# Patient Record
Sex: Female | Born: 1970 | Race: Black or African American | Hispanic: No | Marital: Single | Smoking: Former smoker
Health system: Southern US, Community
[De-identification: ages and names within clinical notes are randomized; demographics above are authoritative.]

## PROBLEM LIST (undated history)

## (undated) DIAGNOSIS — I10 Essential (primary) hypertension: Secondary | ICD-10-CM

## (undated) DIAGNOSIS — Z9889 Other specified postprocedural states: Secondary | ICD-10-CM

## (undated) DIAGNOSIS — I4729 Other ventricular tachycardia: Secondary | ICD-10-CM

## (undated) DIAGNOSIS — I5022 Chronic systolic (congestive) heart failure: Secondary | ICD-10-CM

## (undated) DIAGNOSIS — S83206A Unspecified tear of unspecified meniscus, current injury, right knee, initial encounter: Secondary | ICD-10-CM

## (undated) DIAGNOSIS — I472 Ventricular tachycardia: Secondary | ICD-10-CM

## (undated) DIAGNOSIS — F329 Major depressive disorder, single episode, unspecified: Secondary | ICD-10-CM

## (undated) DIAGNOSIS — R002 Palpitations: Secondary | ICD-10-CM

## (undated) DIAGNOSIS — S83511A Sprain of anterior cruciate ligament of right knee, initial encounter: Secondary | ICD-10-CM

## (undated) DIAGNOSIS — B009 Herpesviral infection, unspecified: Secondary | ICD-10-CM

## (undated) DIAGNOSIS — F32A Depression, unspecified: Secondary | ICD-10-CM

## (undated) DIAGNOSIS — Z87898 Personal history of other specified conditions: Secondary | ICD-10-CM

## (undated) HISTORY — DX: Ventricular tachycardia: I47.2

## (undated) HISTORY — PX: ABLATION: SHX5711

## (undated) HISTORY — PX: SVT ABLATION: EP1225

## (undated) HISTORY — DX: Depression, unspecified: F32.A

## (undated) HISTORY — DX: Other specified postprocedural states: Z98.890

## (undated) HISTORY — DX: Other ventricular tachycardia: I47.29

## (undated) HISTORY — DX: Major depressive disorder, single episode, unspecified: F32.9

## (undated) HISTORY — DX: Essential (primary) hypertension: I10

## (undated) HISTORY — DX: Palpitations: R00.2

## (undated) HISTORY — PX: CARDIAC VALVE SURGERY: SHX40

## (undated) HISTORY — DX: Herpesviral infection, unspecified: B00.9

---
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
## 1998-04-21 ENCOUNTER — Inpatient Hospital Stay (HOSPITAL_COMMUNITY): Admission: AD | Admit: 1998-04-21 | Discharge: 1998-04-21 | Payer: Self-pay | Admitting: Obstetrics & Gynecology

## 1998-04-22 ENCOUNTER — Inpatient Hospital Stay (HOSPITAL_COMMUNITY): Admission: AD | Admit: 1998-04-22 | Discharge: 1998-04-22 | Payer: Self-pay | Admitting: Obstetrics & Gynecology

## 1998-04-26 ENCOUNTER — Inpatient Hospital Stay (HOSPITAL_COMMUNITY): Admission: AD | Admit: 1998-04-26 | Discharge: 1998-04-26 | Payer: Self-pay | Admitting: *Deleted

## 1998-05-02 ENCOUNTER — Inpatient Hospital Stay (HOSPITAL_COMMUNITY): Admission: AD | Admit: 1998-05-02 | Discharge: 1998-05-04 | Payer: Self-pay | Admitting: Obstetrics and Gynecology

## 1998-11-12 ENCOUNTER — Emergency Department (HOSPITAL_COMMUNITY): Admission: EM | Admit: 1998-11-12 | Discharge: 1998-11-12 | Payer: Self-pay | Admitting: Emergency Medicine

## 1998-11-12 ENCOUNTER — Encounter: Payer: Self-pay | Admitting: Emergency Medicine

## 2000-01-23 ENCOUNTER — Ambulatory Visit (HOSPITAL_COMMUNITY): Admission: RE | Admit: 2000-01-23 | Discharge: 2000-01-23 | Payer: Self-pay | Admitting: *Deleted

## 2000-01-23 ENCOUNTER — Encounter: Payer: Self-pay | Admitting: *Deleted

## 2006-08-27 ENCOUNTER — Ambulatory Visit (HOSPITAL_COMMUNITY): Admission: RE | Admit: 2006-08-27 | Discharge: 2006-08-27 | Payer: Self-pay | Admitting: Ophthalmology

## 2006-08-27 HISTORY — PX: RETINAL DETACHMENT REPAIR W/ SCLERAL BUCKLE LE: SHX2338

## 2007-02-27 ENCOUNTER — Other Ambulatory Visit: Admission: RE | Admit: 2007-02-27 | Discharge: 2007-02-27 | Payer: Self-pay | Admitting: Family Medicine

## 2010-12-28 NOTE — Op Note (Signed)
NAMEBRIAN, Sydney Jackson              ACCOUNT NO.:  [id number]   MEDICAL RECORD NO.:  [id number]          PATIENT TYPE:  AMB   LOCATION:  SDS                          FACILITY:  [facility]   PHYSICIAN:  Jillyn Hidden A. Rankin, M.D.   DATE OF BIRTH:  [birth year]   DATE OF PROCEDURE:  08/27/2006  DATE OF DISCHARGE:                               OPERATIVE REPORT   PREOPERATIVE DIAGNOSIS:  Rhegmatogenous detachment - left eye, with  retinal dialysis.   PROCEDURE:  1. Scleral buckle using 240, 70, and 287 solid silicone explants.  2. Retinal cryopexy, left eye.  3. External drainage of subretinal fluid, left eye.   SURGEON:  Alford Highland. Rankin, MD   ANESTHESIA:  General endotracheal anesthesia.   INDICATIONS FOR PROCEDURE:  The patient is a 40 year old woman who has  asymptomatic inferior retinal dialysis with accompanying rhegmatogenous  detachment approximately 2 o'clock hours anterior to the equator.  There  are clear signs of chronicity to this area, and thus, this patient had a  2nd opinion with a physician elsewhere, confirming the presence of  retinal detachment and the need for scleral buckle retinal detachment  repair intervention.  She understands the risks of anesthesia, including  the rare occurrence of death, loss of the eye including from the  condition, as well as surgical repair, hemorrhage, infection, scarring,  need for another surgery, no change in vision, loss of vision,  progression of disease despite intervention.  Appropriate signed consent  was obtained.  The patient was taken to the operating room.   In the operating room, appropriate monitoring was followed by mild  sedation.  General endotracheal anesthesia instituted without  difficulty.  The left periocular region was sterilely prepped and draped  in the usual sterile fashion.  Lid speculum applied.  Conjunctival  peritomy was advanced to 360 degrees.  Rectus muscles isolated on 2-0  silk ties.  Solid silicone explant was  selected to place in the  inferotemporal quadrant for this detachment extending from the 4 o'clock  position to the 6 o'clock position.  A retinal cryopexy was placed at  the edge of the retinal dialysis.  A small area noted in the  superotemporal quadrant was also treated as suspicious for retinal  lattice degeneration.   At this time, the silicone explant was then placed and temporarily tied  with fiber mersalyl and matched sutures in the inferotemporal quadrant.  The encircling band was placed and secured in the superonasal quadrant  with a Watzke sleeve.  At this time, appropriate tension was applied.  External drainage was carried out with direct visualization with the  ophthalmoscope, the subretinal fluid with a 26-gauge needle __________.  This was carried out without difficulty.  At this time, the buckle was  then tightened permanently with appropriate height to support the  retinal detachment.  Band was tightened, as well, to secure the vitreous  base upon itself.  At this time, the band was then trimmed  appropriately.  Mersalyl had been used to secure the band in each of the  other quadrants.  Bed of the buckle  was irrigated  with bug juice.  Conjunctiva and Tenon's were then brought  forward and closed in layers with 7-0 Vicryl.  Subconjunctival injection  of antibiotic were applied.  Sterile patch and Fox shield applied.  The  patient tolerated the procedure well, without complication.      Alford Highland Rankin, M.D.  Electronically Signed     GAR/MEDQ  D:  08/27/2006  T:  08/28/2006  Job:  [id number]

## 2011-01-02 ENCOUNTER — Other Ambulatory Visit (HOSPITAL_COMMUNITY)
Admission: RE | Admit: 2011-01-02 | Discharge: 2011-01-02 | Disposition: A | Discharge status: Home / Self Care | Payer: Managed Care, Other (non HMO) | Source: Ambulatory Visit | Attending: Family Medicine | Admitting: Family Medicine

## 2011-01-02 ENCOUNTER — Other Ambulatory Visit: Payer: Self-pay | Admitting: Family Medicine

## 2011-01-02 DIAGNOSIS — Z01419 Encounter for gynecological examination (general) (routine) without abnormal findings: Secondary | ICD-10-CM | POA: Insufficient documentation

## 2011-01-02 DIAGNOSIS — Z113 Encounter for screening for infections with a predominantly sexual mode of transmission: Secondary | ICD-10-CM | POA: Insufficient documentation

## 2011-01-24 ENCOUNTER — Other Ambulatory Visit: Payer: Self-pay | Admitting: Dermatology

## 2012-07-13 ENCOUNTER — Telehealth (HOSPITAL_COMMUNITY): Payer: Self-pay

## 2012-07-14 ENCOUNTER — Ambulatory Visit (INDEPENDENT_AMBULATORY_CARE_PROVIDER_SITE_OTHER): Payer: Managed Care, Other (non HMO) | Admitting: Physician Assistant

## 2012-07-14 ENCOUNTER — Encounter (HOSPITAL_COMMUNITY): Payer: Self-pay | Admitting: Physician Assistant

## 2012-07-14 DIAGNOSIS — H3320 Serous retinal detachment, unspecified eye: Secondary | ICD-10-CM | POA: Insufficient documentation

## 2012-07-14 DIAGNOSIS — F419 Anxiety disorder, unspecified: Secondary | ICD-10-CM

## 2012-07-14 DIAGNOSIS — F411 Generalized anxiety disorder: Secondary | ICD-10-CM | POA: Insufficient documentation

## 2012-07-14 DIAGNOSIS — I10 Essential (primary) hypertension: Secondary | ICD-10-CM | POA: Insufficient documentation

## 2012-07-14 DIAGNOSIS — F329 Major depressive disorder, single episode, unspecified: Secondary | ICD-10-CM

## 2012-07-14 DIAGNOSIS — F32A Depression, unspecified: Secondary | ICD-10-CM

## 2012-07-14 MED ORDER — TRAZODONE HCL 50 MG PO TABS: 50.0000 mg | ORAL_TABLET | Freq: Every day | ORAL | Status: DC

## 2012-07-14 NOTE — Progress Notes (Signed)
Psychiatric Assessment Adult  Patient Identification:  Sydney Jackson Date of Evaluation:  07/14/2012 Chief Complaint: Anxiety and depression History of Chief Complaint:  Jalyah self-referred after recommendation by her primary care provider to seek help for coping with stress in her life. Chief Complaint  Patient presents with  . Anxiety  . Depression  . Establish Care    HPI said he reports that for the past 3-4 months she has become overwhelmed with multiple stressors in her life. These include her 42 year old daughter who has recently received the diagnosis of bipolar disorder, and stresses of her work. She complains that she is sleeping poorly, and her energy is low. She began to experience severe anxiety that would present with an increased heart rate and palpitations causing her to think she was having heart problems. She reports that she feels sad, hopeless, helpless, and has a desire to isolate. She states that she is in a "dark place." She also reports that her appetite is poor. She denies any suicidal or homicidal ideation. She denies any auditory or visual hallucinations. She denies any paranoid delusions. Review of Systems Physical Exam  Depressive Symptoms: depressed mood, insomnia, feelings of worthlessness/guilt, hopelessness, anxiety, panic attacks, loss of energy/fatigue,  (Hypo) Manic Symptoms:   Elevated Mood:  No Irritable Mood:  Yes Grandiosity:  No Distractibility:  No Labiality of Mood:  No Delusions:  No Hallucinations:  No Impulsivity:  No Sexually Inappropriate Behavior:  No Financial Extravagance:  No Flight of Ideas:  No  Anxiety Symptoms: Excessive Worry:  Yes Panic Symptoms:  Yes Agoraphobia:  No Obsessive Compulsive: No  Symptoms: None, Specific Phobias:  No Social Anxiety:  No  Psychotic Symptoms:  Hallucinations: No None Delusions:  No Paranoia:  No   Ideas of Reference:  No  PTSD Symptoms: Ever had a traumatic exposure:  Yes Had  a traumatic exposure in the last month:  No Re-experiencing: Yes Intrusive Thoughts Hypervigilance:  Yes Hyperarousal: No None Avoidance: Yes Decreased Interest/Participation  Traumatic Brain Injury: No   Past Psychiatric History: Diagnosis: Depression and anxiety   Hospitalizations: None   Outpatient Care: By primary care provider   Substance Abuse Care: None   Self-Mutilation: None   Suicidal Attempts: None   Violent Behaviors: None    Past Medical History:   Past Medical History  Diagnosis Date  . Hypertension    History of Loss of Consciousness:  Yes Seizure History:  No Cardiac History:  Yes Allergies:  No Known Allergies Current Medications:  Current Outpatient Prescriptions  Medication Sig Dispense Refill  . buPROPion (WELLBUTRIN XL) 300 MG 24 hr tablet       . clonazePAM (KLONOPIN) 0.5 MG tablet       . sertraline (ZOLOFT) 100 MG tablet       . traZODone (DESYREL) 50 MG tablet Take 1 tablet (50 mg total) by mouth at bedtime.  30 tablet  1  . valsartan-hydrochlorothiazide (DIOVAN-HCT) 320-25 MG per tablet         Previous Psychotropic Medications:  Medication Dose   Wellbutrin XL   300 mg daily   Zoloft   100 mg daily   Klonopin One half milligram daily                Substance Abuse History in the last 12 months: Patient denies any substance abuse in the past 12 months   Social History: [name] was born and grew up in [place]. She reports that her childhood was "okay." Her biological  father left when she was 8 or 59 years of age. She has 7 half-siblings by her father, and one half-sibling by her mother. She graduated high school and attended one year of college. She has never been married. She has 4 children, a 35 year old son, a 34 year old son, a 71 year old daughter, and her 24 year old daughter. Her 28 year old and 49 year old live with her now. 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  She denies any legal problems. She affiliates as a Loss adjuster, chartered. She reports that she has one friend who she uses for social support.  Family History:  No family history on file.  Mental Status Examination/Evaluation: Objective:  Appearance: Neat and Well Groomed  Eye Contact::  Good  Speech:  Clear and Coherent  Volume:  Normal  Mood:  Depressed   Affect:  Congruent  Thought Process:  Linear  Orientation:  Full  Thought Content:  WDL  Suicidal Thoughts:  No  Homicidal Thoughts:  No  Judgement:  Fair  Insight:  Fair  Psychomotor Activity:  Normal  Akathisia:  No  Handed:    AIMS (if indicated):    Assets:  Communication Skills Desire for Improvement Financial Resources/Insurance Housing Physical Health    Laboratory/X-Ray Psychological Evaluation(s)        Assessment:    AXIS I Depressive Disorder NOS and Generalized Anxiety Disorder  AXIS II Deferred  AXIS III Past Medical History  Diagnosis Date  . Hypertension      AXIS IV occupational problems  AXIS V 51-60 moderate symptoms   Treatment Plan/Recommendations:  Plan of Care: We will continue the Wellbutrin XL 300 mg daily, Zoloft 100 mg daily, and start her on trazodone 50 mg at bedtime. It is recommended that she join and attend the psychiatric intensive outpatient program at [facility]. She will be referred to an individual therapist as well.   Laboratory:    Psychotherapy: IOP recommended, individual referral upcoming   Medications: As above in plan of care   Routine PRN Medications:  No  Consultations:   Safety Concerns:    Other:      Melora Menon, PA-C 12/3/20134:54 PM

## 2012-09-16 ENCOUNTER — Ambulatory Visit (HOSPITAL_COMMUNITY): Payer: Managed Care, Other (non HMO) | Admitting: Physician Assistant

## 2013-08-12 HISTORY — PX: KNEE REPAIR EXTENSOR MECHANISM: SHX6613

## 2013-10-24 ENCOUNTER — Ambulatory Visit (INDEPENDENT_AMBULATORY_CARE_PROVIDER_SITE_OTHER): Payer: Managed Care, Other (non HMO) | Admitting: Family Medicine

## 2013-10-24 ENCOUNTER — Ambulatory Visit: Payer: Managed Care, Other (non HMO)

## 2013-10-24 VITALS — BP 122/80 | HR 90 | Temp 97.7°F | Resp 16 | Ht 65.0 in | Wt 213.0 lb

## 2013-10-24 DIAGNOSIS — M25561 Pain in right knee: Secondary | ICD-10-CM

## 2013-10-24 DIAGNOSIS — M25569 Pain in unspecified knee: Secondary | ICD-10-CM

## 2013-10-24 MED ORDER — HYDROCODONE-ACETAMINOPHEN 5-325 MG PO TABS: 1.0000 | ORAL_TABLET | Freq: Three times a day (TID) | ORAL | Status: DC | PRN

## 2013-10-24 NOTE — Patient Instructions (Addendum)
Please use your crutches to keep your weight off of your right knee.  Wear your knee brace, and ice/ elelvate the knee when you can.  Try to apply ice for 20 minutes at least 3x a day.    Ibuprofen or tylenol are ok for pain.  Remember the rx pain medication does have some tylenol in it as well so do not double up.  The rx pain medication will make you sleepy- do not drive while taking it.    Let me know if you do not feel better in the next few days- if you still are having trouble I will refer you to orthopedics.    If you do not have crutches of the appropriate height please get some at [facility].

## 2013-10-24 NOTE — Progress Notes (Signed)
[facility] [address], [place] [address] [phone]  Date:  10/24/2013   Name:  Sydney Jackson   DOB:  Oct 27, 1970   MRN:  [id number]  PCP:  No PCP Per Patient    Chief Complaint: Knee Pain   History of Present Illness:  Sydney Jackson is a 43 y.o. very pleasant female patient who presents with the following:  Here today as a new patient with knee pain. Yesterday evening she did something to her right knee- she is not sure exactly how she injured her self.  She twisted her body quickly and had immediate pain in her right knee.  She fell to the floor as her knee collapsed.  She got up but her knee collapsed again.  She did not otherwise get hurt during this episode  She continues to note pain in her knee and cannot really bear weight on her knee.  She is using a crutch that she had at home- however it is too tall for her.    When she tries to put all her weight on the knee she cannot really do it, and the knee feels unstable  She has not had any other problems with her knee in the past.   She is genererally healthy except for HTN and depression- she is only onto HTN medications currently  LMP about 2 weeks ago  Patient Active Problem List   Diagnosis Date Noted  . Hypertension 07/14/2012  . Generalized anxiety disorder 07/14/2012  . Depression 07/14/2012  . Retinal detachment 07/14/2012    Past Medical History  Diagnosis Date  . Hypertension     No past surgical history on file.  History  Substance Use Topics  . Smoking status: Never Smoker   . Smokeless tobacco: Never Used  . Alcohol Use: No     Comment: Glass or two of wine on occasion, past history of heavy use     No family history on file.  No Known Allergies  Medication list has been reviewed and updated.  Current Outpatient Prescriptions on File Prior to Visit  Medication Sig Dispense Refill  . valsartan-hydrochlorothiazide (DIOVAN-HCT) 320-25 MG per tablet       . buPROPion  (WELLBUTRIN XL) 300 MG 24 hr tablet       . clonazePAM (KLONOPIN) 0.5 MG tablet       . sertraline (ZOLOFT) 100 MG tablet       . traZODone (DESYREL) 50 MG tablet Take 1 tablet (50 mg total) by mouth at bedtime.  30 tablet  1   No current facility-administered medications on file prior to visit.    Review of Systems:  As per HPI- otherwise negative.   Physical Examination: Filed Vitals:   10/24/13 1435  BP: 144/100  Pulse: 103  Temp: 97.7 F (36.5 C)  Resp: 16   Filed Vitals:   10/24/13 1435  Height: 5\' 5"  (1.651 m)  Weight: 213 lb (96.616 kg)   Body mass index is 35.44 kg/(m^2). Ideal Body Weight: Weight in (lb) to have BMI = 25: 149.9  GEN: WDWN, NAD, Non-toxic, A & O x 3, obese HEENT: Atraumatic, Normocephalic. Neck supple. No masses, No LAD. Ears and Nose: No external deformity. CV: RRR, No M/G/R. No JVD. No thrill. No extra heart sounds. PULM: CTA B, no wheezes, crackles, rhonchi. No retractions. No resp. distress. No accessory muscle use. EXTR: No c/c/e NEURO not able to fully bear weight on the right knee.   PSYCH:  Normally interactive. Conversant. Not depressed or anxious appearing.  Calm demeanor.  Right knee:  She has pain with ROM of the knee, flexion is restricted due to pain.  Minimal effusion, no heat or redness.  She is tender most along the medial joint line.  However the knee feels stable, no crepitus.  Ankle is negative, calf is soft and normal.    [facility] reading (PRIMARY) by  Dr. Patsy Lageropland. Right knee: negative  EXAM:  RIGHT KNEE - COMPLETE 4+ VIEW  COMPARISON: None.  FINDINGS:  Frontal, lateral, bilateral oblique, and sunrise patellar images  were obtained. No fracture, dislocation, or effusion. Joint spaces  appear intact. There is a spur arising from the anterior superior  patella.  IMPRESSION:  Spur arising from the anterior superior patella. No fracture or  effusion.    Assessment and Plan: Right knee pain - Plan: DG Knee Complete 4 Views  Right, HYDROcodone-acetaminophen (NORCO/VICODIN) 5-325 MG per tablet  Placed in a knee immobilizer.  She prefers to use her own crutches for cost effectiveness, will look for some that fit at home or buy.  Ice, elevation, NSAIDs, vicodin for more severe pain.  Offered to go ahead and refer to ortho but she would like to see how she is in a few days.  She does have a deductible to get through which is concerning to her.    Signed Abbe AmsterdamJessica Mckenze Slone, MD

## 2013-10-28 ENCOUNTER — Telehealth: Payer: Self-pay

## 2013-10-28 DIAGNOSIS — M25569 Pain in unspecified knee: Secondary | ICD-10-CM

## 2013-10-28 NOTE — Telephone Encounter (Signed)
FYI Called pt- Advised to RTC if knee is getting worse before her appt. She states she was weight bearing while getting into a car on Wednesday. The swelling has gone down some. She feels like there is air under her skin. She has been using ice frequently. Advised her to try to rest knee and elevate. Has appt with [facility] on Saturday.   Reviewed Dr. Cyndie Chime OV- refer to Ortho if not improved.  I have entered referral order.

## 2013-10-28 NOTE — Telephone Encounter (Signed)
Patient was seen on Sunday, Stated Dr. Patsy Lager told her to call back for a follow up. Patient states her right knee is swollen, when applying pressure to her knee she falls but not every time. Patient stated she is scared because she doesn't know what is going on if this is internal. Patient request to see Ortho.

## 2013-11-05 ENCOUNTER — Telehealth: Payer: Self-pay

## 2013-11-05 ENCOUNTER — Encounter: Payer: Self-pay | Admitting: *Deleted

## 2013-11-05 NOTE — Telephone Encounter (Signed)
Pt would like a out of work note for 10/26/2013-10/29/13, for her knee issues. Best# [phone]

## 2013-11-05 NOTE — Telephone Encounter (Signed)
Note written- in p/u drawer Lm for pt- advised it is ready for p/u

## 2014-01-06 ENCOUNTER — Encounter (HOSPITAL_BASED_OUTPATIENT_CLINIC_OR_DEPARTMENT_OTHER): Payer: Self-pay | Admitting: *Deleted

## 2014-01-10 HISTORY — PX: KNEE ARTHROSCOPY: SUR90

## 2014-01-13 ENCOUNTER — Encounter (HOSPITAL_BASED_OUTPATIENT_CLINIC_OR_DEPARTMENT_OTHER): Payer: Self-pay | Admitting: *Deleted

## 2014-01-17 ENCOUNTER — Encounter (HOSPITAL_BASED_OUTPATIENT_CLINIC_OR_DEPARTMENT_OTHER): Payer: Self-pay | Admitting: *Deleted

## 2014-01-17 NOTE — Progress Notes (Addendum)
NPO AFTER MN WITH EXCEPTION CLEAR LIQUIDS UNTIL 0700 (NO CREAM/ MILK PRODUCTS).  ARRIVE AT 1115. NEEDS ISTAT AND EKG. MAY TAKE HYDROCODONE AM DOS W/ SIPS OF WATER.  ADVISED PT TO MONITOR BP  IN AM  & PM AND TAKE BP MEDICATION TO KEEP BP UNDER CONTROL FOR SURGERY.

## 2014-01-21 ENCOUNTER — Ambulatory Visit (HOSPITAL_BASED_OUTPATIENT_CLINIC_OR_DEPARTMENT_OTHER): Payer: Managed Care, Other (non HMO) | Admitting: Anesthesiology

## 2014-01-21 ENCOUNTER — Encounter (HOSPITAL_BASED_OUTPATIENT_CLINIC_OR_DEPARTMENT_OTHER): Payer: Managed Care, Other (non HMO) | Admitting: Anesthesiology

## 2014-01-21 ENCOUNTER — Encounter (HOSPITAL_BASED_OUTPATIENT_CLINIC_OR_DEPARTMENT_OTHER): Payer: Self-pay

## 2014-01-21 ENCOUNTER — Encounter (HOSPITAL_BASED_OUTPATIENT_CLINIC_OR_DEPARTMENT_OTHER)
Admission: RE | Disposition: A | Discharge status: Home / Self Care | Payer: Self-pay | Source: Ambulatory Visit | Attending: Specialist

## 2014-01-21 ENCOUNTER — Ambulatory Visit (HOSPITAL_BASED_OUTPATIENT_CLINIC_OR_DEPARTMENT_OTHER)
Admission: RE | Admit: 2014-01-21 | Discharge: 2014-01-21 | Disposition: A | Discharge status: Home / Self Care | Payer: Managed Care, Other (non HMO) | Source: Ambulatory Visit | Attending: Specialist | Admitting: Specialist

## 2014-01-21 ENCOUNTER — Other Ambulatory Visit: Payer: Self-pay | Admitting: Orthopedic Surgery

## 2014-01-21 DIAGNOSIS — Z79899 Other long term (current) drug therapy: Secondary | ICD-10-CM | POA: Insufficient documentation

## 2014-01-21 DIAGNOSIS — Z9889 Other specified postprocedural states: Secondary | ICD-10-CM

## 2014-01-21 DIAGNOSIS — I10 Essential (primary) hypertension: Secondary | ICD-10-CM | POA: Insufficient documentation

## 2014-01-21 DIAGNOSIS — M2350 Chronic instability of knee, unspecified knee: Secondary | ICD-10-CM | POA: Insufficient documentation

## 2014-01-21 HISTORY — DX: Sprain of anterior cruciate ligament of right knee, initial encounter: S83.511A

## 2014-01-21 HISTORY — DX: Personal history of other specified conditions: Z87.898

## 2014-01-21 HISTORY — DX: Other specified postprocedural states: Z98.890

## 2014-01-21 HISTORY — DX: Unspecified tear of unspecified meniscus, current injury, right knee, initial encounter: S83.206A

## 2014-01-21 HISTORY — PX: KNEE ARTHROSCOPY: SHX127

## 2014-01-21 LAB — POCT I-STAT 4, (NA,K, GLUC, HGB,HCT)
Glucose, Bld: 92 mg/dL (ref 70–99)
HCT: 37 % (ref 36.0–46.0)
Hemoglobin: 12.6 g/dL (ref 12.0–15.0)
Potassium: 5.4 mEq/L — ABNORMAL HIGH (ref 3.7–5.3)
Sodium: 135 mEq/L — ABNORMAL LOW (ref 137–147)

## 2014-01-21 SURGERY — ARTHROSCOPY, KNEE
Anesthesia: General | Site: Knee | Laterality: Right

## 2014-01-21 MED ORDER — BUPIVACAINE HCL (PF) 0.25 % IJ SOLN
INTRAMUSCULAR | Status: DC | PRN
  Administered 2014-01-21: 30 mL

## 2014-01-21 MED ORDER — MIDAZOLAM HCL 2 MG/2ML IJ SOLN
2.0000 mg | Freq: Once | INTRAMUSCULAR | Status: AC
  Administered 2014-01-21: 2 mg via INTRAVENOUS
  Filled 2014-01-21: qty 2

## 2014-01-21 MED ORDER — DEXAMETHASONE SODIUM PHOSPHATE 4 MG/ML IJ SOLN
INTRAMUSCULAR | Status: DC | PRN
  Administered 2014-01-21: 10 mg via INTRAVENOUS

## 2014-01-21 MED ORDER — ONDANSETRON HCL 4 MG/2ML IJ SOLN
INTRAMUSCULAR | Status: DC | PRN
  Administered 2014-01-21: 4 mg via INTRAVENOUS

## 2014-01-21 MED ORDER — MORPHINE SULFATE 4 MG/ML IJ SOLN
INTRAMUSCULAR | Status: DC | PRN
  Administered 2014-01-21: 4 mg via INTRAVENOUS

## 2014-01-21 MED ORDER — PROMETHAZINE HCL 25 MG/ML IJ SOLN
6.2500 mg | INTRAMUSCULAR | Status: DC | PRN
  Filled 2014-01-21: qty 1

## 2014-01-21 MED ORDER — OXYCODONE-ACETAMINOPHEN 5-325 MG PO TABS
1.0000 | ORAL_TABLET | ORAL | Status: DC | PRN
  Administered 2014-01-21 (×2): 1 via ORAL
  Filled 2014-01-21: qty 2

## 2014-01-21 MED ORDER — FENTANYL CITRATE 0.05 MG/ML IJ SOLN
INTRAMUSCULAR | Status: DC | PRN
  Administered 2014-01-21 (×8): 25 ug via INTRAVENOUS

## 2014-01-21 MED ORDER — METHOCARBAMOL 500 MG PO TABS: 500.0000 mg | ORAL_TABLET | Freq: Four times a day (QID) | ORAL | Status: DC | PRN

## 2014-01-21 MED ORDER — LACTATED RINGERS IV SOLN
INTRAVENOUS | Status: DC
  Administered 2014-01-21 (×2): via INTRAVENOUS
  Filled 2014-01-21: qty 1000

## 2014-01-21 MED ORDER — FENTANYL CITRATE 0.05 MG/ML IJ SOLN
50.0000 ug | Freq: Once | INTRAMUSCULAR | Status: AC
  Administered 2014-01-21: 50 ug via INTRAVENOUS
  Filled 2014-01-21: qty 1

## 2014-01-21 MED ORDER — LIDOCAINE HCL (CARDIAC) 20 MG/ML IV SOLN
INTRAVENOUS | Status: DC | PRN
  Administered 2014-01-21: 50 mg via INTRAVENOUS

## 2014-01-21 MED ORDER — FENTANYL CITRATE 0.05 MG/ML IJ SOLN
INTRAMUSCULAR | Status: AC
  Filled 2014-01-21: qty 6

## 2014-01-21 MED ORDER — METHOCARBAMOL 500 MG PO TABS
ORAL_TABLET | ORAL | Status: AC
  Filled 2014-01-21: qty 1

## 2014-01-21 MED ORDER — OXYCODONE-ACETAMINOPHEN 5-325 MG PO TABS: 1.0000 | ORAL_TABLET | ORAL | Status: DC | PRN

## 2014-01-21 MED ORDER — METHOCARBAMOL 500 MG PO TABS
500.0000 mg | ORAL_TABLET | Freq: Four times a day (QID) | ORAL | Status: DC | PRN
  Administered 2014-01-21: 500 mg via ORAL
  Filled 2014-01-21: qty 1

## 2014-01-21 MED ORDER — MIDAZOLAM HCL 5 MG/5ML IJ SOLN
INTRAMUSCULAR | Status: DC | PRN
  Administered 2014-01-21: 2 mg via INTRAVENOUS

## 2014-01-21 MED ORDER — MIDAZOLAM HCL 2 MG/2ML IJ SOLN
INTRAMUSCULAR | Status: AC
  Filled 2014-01-21: qty 2

## 2014-01-21 MED ORDER — ASPIRIN EC 325 MG PO TBEC: 325.0000 mg | DELAYED_RELEASE_TABLET | Freq: Two times a day (BID) | ORAL | Status: DC

## 2014-01-21 MED ORDER — MORPHINE SULFATE 4 MG/ML IJ SOLN
INTRAMUSCULAR | Status: AC
  Filled 2014-01-21: qty 1

## 2014-01-21 MED ORDER — HYDROMORPHONE HCL PF 1 MG/ML IJ SOLN
0.2500 mg | INTRAMUSCULAR | Status: DC | PRN
Start: 2014-01-21 — End: 2014-01-21
  Administered 2014-01-21 (×2): 0.25 mg via INTRAVENOUS
  Filled 2014-01-21: qty 1

## 2014-01-21 MED ORDER — PROPOFOL 10 MG/ML IV BOLUS
INTRAVENOUS | Status: DC | PRN
Start: 2014-01-21 — End: 2014-01-21
  Administered 2014-01-21: 200 mg via INTRAVENOUS

## 2014-01-21 MED ORDER — OXYCODONE-ACETAMINOPHEN 5-325 MG PO TABS
ORAL_TABLET | ORAL | Status: AC
  Filled 2014-01-21: qty 1

## 2014-01-21 MED ORDER — KETOROLAC TROMETHAMINE 30 MG/ML IJ SOLN
15.0000 mg | Freq: Once | INTRAMUSCULAR | Status: DC | PRN
  Filled 2014-01-21: qty 1

## 2014-01-21 MED ORDER — CEFAZOLIN SODIUM-DEXTROSE 2-3 GM-% IV SOLR
INTRAVENOUS | Status: DC | PRN
  Administered 2014-01-21: 2 g via INTRAVENOUS

## 2014-01-21 MED ORDER — HYDROMORPHONE HCL PF 1 MG/ML IJ SOLN
INTRAMUSCULAR | Status: AC
  Filled 2014-01-21: qty 1

## 2014-01-21 MED ORDER — SODIUM CHLORIDE 0.9 % IR SOLN
Status: DC | PRN
  Administered 2014-01-21: 27000 mL

## 2014-01-21 MED ORDER — CEPHALEXIN 500 MG PO CAPS: 500.0000 mg | ORAL_CAPSULE | Freq: Three times a day (TID) | ORAL | Status: DC

## 2014-01-21 SURGICAL SUPPLY — 87 items
ANCHOR BUTTON TIGHTROPE ACL RT (Orthopedic Implant) ×1 IMPLANT
ANCHOR PUSHLOCK BIOCOMP 3.5X19 (Orthopedic Implant) ×2 IMPLANT
BANDAGE ESMARK 6X9 LF (GAUZE/BANDAGES/DRESSINGS) ×1 IMPLANT
BANDAGE GAUZE ELAST BULKY 4 IN (GAUZE/BANDAGES/DRESSINGS) ×2 IMPLANT
BLADE 4.2CUDA (BLADE) IMPLANT
BLADE CUDA 4.2 (BLADE) IMPLANT
BLADE CUDA 5.5 (BLADE) ×2 IMPLANT
BLADE CUDA GRT WHITE 3.5 (BLADE) ×2 IMPLANT
BLADE CUDA SHAVER 3.5 (BLADE) IMPLANT
BLADE GREAT WHITE 4.2 (BLADE) IMPLANT
BLADE SURG 10 STRL SS (BLADE) ×2 IMPLANT
BLADE SURG 15 STRL LF DISP TIS (BLADE) ×1 IMPLANT
BLADE SURG 15 STRL SS (BLADE) ×2
BNDG CMPR 9X6 STRL LF SNTH (GAUZE/BANDAGES/DRESSINGS) ×1
BNDG ESMARK 6X9 LF (GAUZE/BANDAGES/DRESSINGS) ×2
BNDG GAUZE ELAST 4 BULKY (GAUZE/BANDAGES/DRESSINGS) ×4 IMPLANT
BUR OVAL 6.0 (BURR) IMPLANT
BUR VERTEX HOODED 4.5 (BURR) ×2 IMPLANT
CANISTER SUCT LVC 12 LTR MEDI- (MISCELLANEOUS) ×3 IMPLANT
CANISTER SUCTION 1200CC (MISCELLANEOUS) ×1 IMPLANT
CANISTER SUCTION 2500CC (MISCELLANEOUS) IMPLANT
CLOTH BEACON ORANGE TIMEOUT ST (SAFETY) ×2 IMPLANT
COVER TABLE BACK 60X90 (DRAPES) ×2 IMPLANT
CUFF TOURNIQUET SINGLE 34IN LL (TOURNIQUET CUFF) ×2 IMPLANT
DRAPE ARTHROSCOPY W/POUCH 114 (DRAPES) ×2 IMPLANT
DRAPE INCISE 23X17 IOBAN STRL (DRAPES) ×1
DRAPE INCISE 23X17 STRL (DRAPES) ×1 IMPLANT
DRAPE INCISE IOBAN 23X17 STRL (DRAPES) ×1 IMPLANT
DRAPE INCISE IOBAN 66X45 STRL (DRAPES) ×2 IMPLANT
DRAPE LG THREE QUARTER DISP (DRAPES) ×4 IMPLANT
DRAPE OEC MINIVIEW 54X84 (DRAPES) ×2 IMPLANT
DRAPE U-SHAPE 47X51 STRL (DRAPES) ×2 IMPLANT
DRILL FLIPCUTTER II 8.5MM (INSTRUMENTS) IMPLANT
DRILL FLIPCUTTER II 9.0MM (INSTRUMENTS) ×1 IMPLANT
DURAPREP 26ML APPLICATOR (WOUND CARE) ×2 IMPLANT
ELECT MENISCUS 165MM 90D (ELECTRODE) IMPLANT
ELECT REM PT RETURN 9FT ADLT (ELECTROSURGICAL) ×2
ELECTRODE REM PT RTRN 9FT ADLT (ELECTROSURGICAL) ×1 IMPLANT
FIBERSTICK 2 (SUTURE) ×1 IMPLANT
FLIPCUTTER II 8.5MM (INSTRUMENTS) ×2
FLIPCUTTER II 9.0MM (INSTRUMENTS) ×2
GAUZE XEROFORM 1X8 LF (GAUZE/BANDAGES/DRESSINGS) ×2 IMPLANT
GLOVE BIO SURGEON STRL SZ7.5 (GLOVE) ×2 IMPLANT
GLOVE INDICATOR 8.0 STRL GRN (GLOVE) ×4 IMPLANT
GLOVE SURG ORTHO 8.0 STRL STRW (GLOVE) ×2 IMPLANT
GLOVE SURG SS PI 7.0 STRL IVOR (GLOVE) ×2 IMPLANT
GOWN PREVENTION PLUS LG XLONG (DISPOSABLE) ×2 IMPLANT
GOWN STRL REIN XL XLG (GOWN DISPOSABLE) ×4 IMPLANT
IMMOBILIZER KNEE 22 UNIV (SOFTGOODS) IMPLANT
IMMOBILIZER KNEE 24 THIGH 36 (MISCELLANEOUS) IMPLANT
IMMOBILIZER KNEE 24 UNIV (MISCELLANEOUS)
IV NS IRRIG 3000ML ARTHROMATIC (IV SOLUTION) ×13 IMPLANT
KIT BUTTON TIGHTROPE ABS 8X12 (Anchor) ×1 IMPLANT
KIT RETRO BUTTON TIGHTROPE ABS (Anchor) ×1 IMPLANT
KNEE WRAP E Z 3 GEL PACK (MISCELLANEOUS) ×2 IMPLANT
MINI VAC (SURGICAL WAND) ×1 IMPLANT
NEEDLE HYPO 22GX1.5 SAFETY (NEEDLE) ×2 IMPLANT
PACK ARTHROSCOPY DSU (CUSTOM PROCEDURE TRAY) ×2 IMPLANT
PACK BASIN DAY SURGERY FS (CUSTOM PROCEDURE TRAY) ×2 IMPLANT
PAD ABD 8X10 STRL (GAUZE/BANDAGES/DRESSINGS) ×4 IMPLANT
PADDING CAST ABS 4INX4YD NS (CAST SUPPLIES) ×1
PADDING CAST ABS COTTON 4X4 ST (CAST SUPPLIES) ×1 IMPLANT
PENCIL BUTTON HOLSTER BLD 10FT (ELECTRODE) ×2 IMPLANT
SET ARTHROSCOPY TUBING (MISCELLANEOUS) ×2
SET ARTHROSCOPY TUBING LN (MISCELLANEOUS) ×1 IMPLANT
SET PAD KNEE POSITIONER (MISCELLANEOUS) ×2 IMPLANT
SPONGE GAUZE 4X4 12PLY (GAUZE/BANDAGES/DRESSINGS) ×3 IMPLANT
SPONGE LAP 4X18 X RAY DECT (DISPOSABLE) ×1 IMPLANT
STRIP CLOSURE SKIN 1/2X4 (GAUZE/BANDAGES/DRESSINGS) ×1 IMPLANT
SUT 2 FIBERLOOP 20 STRT BLUE (SUTURE) ×4
SUT ETHILON 4 0 PS 2 18 (SUTURE) ×2 IMPLANT
SUT FIBERWIRE #2 38 T-5 BLUE (SUTURE)
SUT MNCRL AB 3-0 PS2 18 (SUTURE) ×2 IMPLANT
SUT VIC AB 0 CT1 36 (SUTURE) ×4 IMPLANT
SUT VIC AB 0 CT2 27 (SUTURE) IMPLANT
SUT VIC AB 2-0 CT1 27 (SUTURE) ×2
SUT VIC AB 2-0 CT1 TAPERPNT 27 (SUTURE) ×1 IMPLANT
SUTURE 2 FIBERLOOP 20 STRT BLU (SUTURE) ×2 IMPLANT
SUTURE FIBERWR #2 38 T-5 BLUE (SUTURE) IMPLANT
SUTURE TIGERSTICK 2 TIGERWIR 2 (MISCELLANEOUS) IMPLANT
SYR CONTROL 10ML LL (SYRINGE) ×2 IMPLANT
TIGERSTICK 2 TIGERWIRE 2 (MISCELLANEOUS) ×2
TOWEL OR 17X24 6PK STRL BLUE (TOWEL DISPOSABLE) ×2 IMPLANT
TUBE CONNECTING 12X1/4 (SUCTIONS) ×2 IMPLANT
WAND 30 DEG SABER W/CORD (SURGICAL WAND) IMPLANT
WAND 90 DEG TURBOVAC W/CORD (SURGICAL WAND) IMPLANT
WATER STERILE IRR 500ML POUR (IV SOLUTION) ×2 IMPLANT

## 2014-01-21 NOTE — Transfer of Care (Signed)
Immediate Anesthesia Transfer of Care Note  Patient: Sydney Jackson  Procedure(s) Performed: Procedure(s): RIGHT  KNEE ARTHROSCOPY WITH DEBRIDEMENT PARTIAL MENISECTOMY AND AUTOGRAPH ACL RECONSTRUCTION (Right)  Patient Location: PACU  Anesthesia Type:GA combined with regional for post-op pain  Level of Consciousness: awake, sedated and patient cooperative  Airway & Oxygen Therapy: Patient Spontanous Breathing and Patient connected to face mask oxygen  Post-op Assessment: Report given to PACU RN and Post -op Vital signs reviewed and stable  Post vital signs: Reviewed and stable  Complications: No apparent anesthesia complications

## 2014-01-21 NOTE — Discharge Instructions (Signed)
°  Post Anesthesia Home Care Instructions ° °Activity: °Get plenty of rest for the remainder of the day. A responsible adult should stay with you for 24 hours following the procedure.  °For the next 24 hours, DO NOT: °-Drive a car °-Operate machinery °-Drink alcoholic beverages °-Take any medication unless instructed by your physician °-Make any legal decisions or sign important papers. ° °Meals: °Start with liquid foods such as gelatin or soup. Progress to regular foods as tolerated. Avoid greasy, spicy, heavy foods. If nausea and/or vomiting occur, drink only clear liquids until the nausea and/or vomiting subsides. Call your physician if vomiting continues. ° °Special Instructions/Symptoms: °Your throat may feel dry or sore from the anesthesia or the breathing tube placed in your throat during surgery. If this causes discomfort, gargle with warm salt water. The discomfort should disappear within 24 hours. ° °Regional Anesthesia Blocks ° °1. Numbness or the inability to move the "blocked" extremity may last from 3-48 hours after placement. The length of time depends on the medication injected and your individual response to the medication. If the numbness is not going away after 48 hours, call your surgeon. ° °2. The extremity that is blocked will need to be protected until the numbness is gone and the  Strength has returned. Because you cannot feel it, you will need to take extra care to avoid injury. Because it may be weak, you may have difficulty moving it or using it. You may not know what position it is in without looking at it while the block is in effect. ° °3. For blocks in the legs and feet, returning to weight bearing and walking needs to be done carefully. You will need to wait until the numbness is entirely gone and the strength has returned. You should be able to move your leg and foot normally before you try and bear weight or walk. You will need someone to be with you when you first try to ensure you  do not fall and possibly risk injury. ° °4. Bruising and tenderness at the needle site are common side effects and will resolve in a few days. ° °5. Persistent numbness or new problems with movement should be communicated to the surgeon or the [facility] ([phone])/ [facility] ([phone]). °

## 2014-01-21 NOTE — Op Note (Signed)
Dictated #   [id number]

## 2014-01-21 NOTE — H&P (Signed)
Sydney Jackson is an 43 y.o. female.   Chief Complaint: Right knee pain and instability for weeks HPI: Patient presents with right knee discomfort that had been persistent for several weeks now. Despite conservative treatments, her discomfort has not improved. Imaging was obtained. Other conservative and surgical treatments were discussed in detail. Patient wishes to proceed with surgery as consented. Denies SOB, CP, or calf pain. No Fever, chills, or nausea/ vomiting.   Past Medical History  Diagnosis Date  . Hypertension   . Right ACL tear   . Right knee meniscal tear   . History of syncope     s/p  heart valve repair  1995  . S/P heart valve repair     1995--  pt does not remember which one    Past Surgical History  Procedure Laterality Date  . Retinal detachment repair w/ scleral buckle le Left 08-27-2006  . Cardiac valve surgery  1995 (approx.  age 43's)    pt does not remember which valve    History reviewed. No pertinent family history. Social History:  reports that she has never smoked. She has never used smokeless tobacco. She reports that she drinks alcohol. She reports that she does not use illicit drugs.  Allergies: No Known Allergies  Medications Prior to Admission  Medication Sig Dispense Refill  . HYDROcodone-acetaminophen (NORCO/VICODIN) 5-325 MG per tablet Take 1 tablet by mouth every 8 (eight) hours as needed.  30 tablet  0  . valsartan-hydrochlorothiazide (DIOVAN-HCT) 320-25 MG per tablet Take 1 tablet by mouth daily as needed (TAKES IN AM  PRN -- PT GOES BY SYMPTOMS ANDMONITORS BP).         Results for orders placed during the hospital encounter of 01/21/14 (from the past 48 hour(s))  POCT I-STAT 4, (NA,K, GLUC, HGB,HCT)     Status: Abnormal   Collection Time    01/21/14 12:16 PM      Result Value Ref Range   Sodium 135 (*) 137 - 147 mEq/L   Potassium 5.4 (*) 3.7 - 5.3 mEq/L   Glucose, Bld 92  70 - 99 mg/dL   HCT 40.937.0  81.136.0 - 91.446.0 %   Hemoglobin 12.6   12.0 - 15.0 g/dL   No results found.  Review of Systems  Constitutional: Negative.   HENT: Negative.   Eyes: Negative.   Respiratory: Negative.   Cardiovascular: Negative.   Gastrointestinal: Negative.   Genitourinary: Negative.   Musculoskeletal: Positive for joint pain.  Skin: Negative.   Neurological: Negative.   Endo/Heme/Allergies: Negative.   Psychiatric/Behavioral: Negative.     Blood pressure 153/104, pulse 82, temperature 97.7 F (36.5 C), temperature source Oral, resp. rate 16, height 5' 4.5" (1.638 m), weight 97.75 kg (215 lb 8 oz), last menstrual period 01/17/2014, SpO2 99.00%. Physical Exam  Constitutional: She is oriented to person, place, and time. She appears well-developed.  HENT:  Head: Normocephalic.  Eyes: EOM are normal.  Neck: Normal range of motion.  Cardiovascular: Normal rate, regular rhythm, normal heart sounds and intact distal pulses.   Respiratory: Effort normal and breath sounds normal.  GI: Soft.  Genitourinary:  Deferred  Musculoskeletal: She exhibits edema and tenderness.  Instability right knee.   Neurological: She is alert and oriented to person, place, and time.  Skin: Skin is warm and dry.  Psychiatric: Her behavior is normal.     Assessment/Plan Right knee ACl tear and meniscus tear: ACL reconstruction and scope D/c home today F/u in office in 3 days Follow  D/c instructions.  Sydney Jackson L 01/21/2014, 1:27 PM

## 2014-01-21 NOTE — Anesthesia Postprocedure Evaluation (Signed)
  Anesthesia Post-op Note  Patient: Sydney Jackson  Procedure(s) Performed: Procedure(s) (LRB): RIGHT  KNEE ARTHROSCOPY WITH DEBRIDEMENT PARTIAL MENISECTOMY AND AUTOGRAPH ACL RECONSTRUCTION (Right)  Patient Location: PACU  Anesthesia Type: General  Level of Consciousness: awake and alert   Airway and Oxygen Therapy: Patient Spontanous Breathing  Post-op Pain: mild  Post-op Assessment: Post-op Vital signs reviewed, Patient's Cardiovascular Status Stable, Respiratory Function Stable, Patent Airway and No signs of Nausea or vomiting  Last Vitals:  Filed Vitals:   01/21/14 1615  BP: 151/103  Pulse: 86  Temp:   Resp: 13    Post-op Vital Signs: stable   Complications: No apparent anesthesia complications

## 2014-01-21 NOTE — Interval H&P Note (Signed)
History and Physical Interval Note:  01/21/2014 1:42 PM  Sydney Jackson  has presented today for surgery, with the diagnosis of RIGHT KNEE TORN ACL AND TORN MENISCUS  The various methods of treatment have been discussed with the patient and family. After consideration of risks, benefits and other options for treatment, the patient has consented to  Procedure(s): RIGHT  KNEE ARTHROSCOPY WITH DEBRIDEMENT PARTIAL MENISECTOMY AND AUTOGRAPH ACL RECONSTRUCTION (Right) ANTERIOR CRUCIATE LIGAMENT (ACL) REPAIR (Right) as a surgical intervention .  The patient's history has been reviewed, patient examined, no change in status, stable for surgery.  I have reviewed the patient's chart and labs.  Questions were answered to the patient's satisfaction.     [name] ANDREW

## 2014-01-21 NOTE — Interval H&P Note (Signed)
History and Physical Interval Note:  01/21/2014 1:42 PM  Sydney Jackson  has presented today for surgery, with the diagnosis of RIGHT KNEE TORN ACL AND TORN MENISCUS  The various methods of treatment have been discussed with the patient and family. After consideration of risks, benefits and other options for treatment, the patient has consented to  Procedure(s): RIGHT  KNEE ARTHROSCOPY WITH DEBRIDEMENT PARTIAL MENISECTOMY AND AUTOGRAPH ACL RECONSTRUCTION (Right) ANTERIOR CRUCIATE LIGAMENT (ACL) REPAIR (Right) as a surgical intervention .  The patient's history has been reviewed, patient examined, no change in status, stable for surgery.  I have reviewed the patient's chart and labs.  Questions were answered to the patient's satisfaction.     Lillien Petronio ANDREW   

## 2014-01-21 NOTE — Anesthesia Preprocedure Evaluation (Signed)
Anesthesia Evaluation  Patient identified by MRN, date of birth, ID band Patient awake    Reviewed: Allergy & Precautions, H&P , NPO status , Patient's Chart, lab work & pertinent test results  Airway Mallampati: II TM Distance: <3 FB Neck ROM: Full    Dental no notable dental hx.    Pulmonary neg pulmonary ROS,  breath sounds clear to auscultation  Pulmonary exam normal       Cardiovascular hypertension, Pt. on medications Rhythm:Regular Rate:Normal     Neuro/Psych negative neurological ROS  negative psych ROS   GI/Hepatic negative GI ROS, Neg liver ROS,   Endo/Other  negative endocrine ROS  Renal/GU negative Renal ROS  negative genitourinary   Musculoskeletal negative musculoskeletal ROS (+)   Abdominal   Peds negative pediatric ROS (+)  Hematology negative hematology ROS (+)   Anesthesia Other Findings   Reproductive/Obstetrics negative OB ROS                           Anesthesia Physical Anesthesia Plan  ASA: II  Anesthesia Plan: General   Post-op Pain Management:    Induction: Intravenous  Airway Management Planned: LMA  Additional Equipment:   Intra-op Plan:   Post-operative Plan: Extubation in OR  Informed Consent: I have reviewed the patients History and Physical, chart, labs and discussed the procedure including the risks, benefits and alternatives for the proposed anesthesia with the patient or authorized representative who has indicated his/her understanding and acceptance.   Dental advisory given  Plan Discussed with: CRNA and Surgeon  Anesthesia Plan Comments:         Anesthesia Quick Evaluation  

## 2014-01-21 NOTE — Progress Notes (Signed)
Dr. Okey Dupre  into see, urine pregnancy screening not required.

## 2014-01-21 NOTE — H&P (View-Only) (Signed)
Dr. Rose  into see, urine pregnancy screening not required. 

## 2014-01-24 NOTE — Op Note (Signed)
NAMMargart Sickles:  Pippen, Sydney Jackson              ACCOUNT NO.:  [id number]  MEDICAL RECORD NO.:  [id number]  LOCATION:                                 FACILITY:  PHYSICIAN:  Erasmo Leventhalobert Andrew Talyssa Gibas, M.D.DATE OF BIRTH:  Dec 29, 1970  DATE OF PROCEDURE:  01/21/2014 DATE OF DISCHARGE:                              OPERATIVE REPORT   PREOPERATIVE DIAGNOSIS:  Right knee torn anterior cruciate ligament, possible torn meniscus.  POSTOPERATIVE DIAGNOSIS:  Right knee torn anterior cruciate ligament.  PROCEDURE:  Right knee arthroscopic-assisted autograft, hamstring, anterior cruciate ligament reconstruction.  SURGEON:  Erasmo Leventhalobert Andrew Levon Boettcher, M.D.  ASSISTANT:  Aggie HackerBryson Stilwell, PA-C  ANESTHESIA:  Femoral nerve block with general.  ESTIMATED BLOOD LOSS:  Minimal.  DRAINS:  None.  COMPLICATIONS:  None.  TOURNIQUET TIME:  90 minutes at 300 mmHg.  DISPOSITION:  PACU, stable.  OPERATIVE DETAILS:  The patient counseled in the holding area.  Correct site was identified.  IV was started, sedation was given, block was administered.  Femoral nerve block was administered per anesthesiologist.  On the way to the operating room, IV Ancef was given. In the OR, placed in supine position under general anesthesia.  All extremities were well padded.  The right hip examined with full extension and flexion.  Positive Lachman's, anterior drawer, and pivot shift.  __________ ligaments and PCL __________ was stable.  She was elevated, prepped with DuraPrep and draped in usual sterile fashion. Time-out was done and confirmed by all and exsanguinated with an Esmarch.  Tourniquet was inflated to 300 mmHg __________ skin and subcutaneous tissue.  The sartorius fascia was identified and it was opened.  The semitendinosus was identified and harvested in the standard fashion protecting the remainder of the sartorius __________.  Sartorius fascia closed with Vicryl suture.  Arthroscopic portals were established proximal  medial, inferomedial, and inferolateral.  Diagnostic arthroscopy was undertaken __________ with normal tracking.  Articular cartilage, suprapatellar pouch, medial and lateral gutters unremarkable.  Medial compartment inspected with normal articular cartilage, normal medial meniscus.  The lateral side inspected, normal articular cartilage, normal lateral meniscus.  __________ PCL.  It was debrided with motorized shaver.  Notchplasty was performed __________ position.  Small incision was made laterally to the skin and subcutaneous tissue, and a Arthrex flip cutter was placed into the anatomic ACL footprint and a retro socket was performed.  FiberWire sutures were placed.  Debris was removed.  From the tibial side, __________ ACL footprint on the tibia.  A flip cutter was replaced from there and another retro socket was performed.  FiberWire suture was placed.  All debris was removed.  The graft was now delivered to the knee joint in a retrograde fashion through the inferomedial portal.  The femoral button __________ cortex confirmed both arthroscopic and visual radiographic.  The graft __________ knee joint.  The tibial side was then dunked into the tibial socket.  At 30 degrees of flexion, neutral __________ tibial graft was securely fixed into the tibial tunnel.  The graft had excellent orientation and tension.  The knee was put through range of motion, nice and stable.  The PushLock anchor was utilized to __________ fixation of tibia.  Now checked the knee  again to clinically stable.  Arthroscopic graft had excellent tension and radiographic with excellent position of femoral cortex.  Arthroscopic equipment was removed.  The portals were closed with 4-0 nylon suture.  The anterior incision was closed with Vicryl in the subcuticular Monocryl suture. Steri-Strips were applied.  Another 10 mL of Sensorcaine placed in the skin and 10 mL of 0.25% Sensorcaine and 40 mL of morphine sulfate  was injected into the knee joint.  Sterile dressing applied, tourniquet was deflated, placed into __________ in full extension.  __________ was applied and the patient tolerated the procedure well.  There were no complications or problems.  The patient received 2 g of Ancef intravenously.  She had normal circulation to foot and ankle at the end of the case.  She was then awakened and taken from the operating room to PACU in stable condition.  She will be stabilized to PACU and discharged home.  To help with the patient positioning, prepping and draping, technical and surgical assistance throughout the entire case, Mr. Arsenio Loader, PA-C, assistance was needed.          ______________________________ Erasmo Leventhal, M.D.     RAC/MEDQ  D:  01/21/2014  T:  01/22/2014  Job:  [id number]

## 2014-01-26 ENCOUNTER — Encounter (HOSPITAL_BASED_OUTPATIENT_CLINIC_OR_DEPARTMENT_OTHER): Payer: Self-pay | Admitting: Specialist

## 2014-12-27 ENCOUNTER — Other Ambulatory Visit: Payer: Self-pay | Admitting: Family Medicine

## 2014-12-27 ENCOUNTER — Other Ambulatory Visit (HOSPITAL_COMMUNITY)
Admission: RE | Admit: 2014-12-27 | Discharge: 2014-12-27 | Disposition: A | Discharge status: Home / Self Care | Payer: Managed Care, Other (non HMO) | Source: Ambulatory Visit | Attending: Family Medicine | Admitting: Family Medicine

## 2014-12-27 DIAGNOSIS — Z01419 Encounter for gynecological examination (general) (routine) without abnormal findings: Secondary | ICD-10-CM | POA: Diagnosis present

## 2014-12-27 DIAGNOSIS — Z1151 Encounter for screening for human papillomavirus (HPV): Secondary | ICD-10-CM | POA: Diagnosis present

## 2014-12-29 LAB — CYTOLOGY - PAP

## 2015-10-01 ENCOUNTER — Ambulatory Visit (INDEPENDENT_AMBULATORY_CARE_PROVIDER_SITE_OTHER): Payer: Managed Care, Other (non HMO) | Admitting: Family Medicine

## 2015-10-01 ENCOUNTER — Ambulatory Visit (INDEPENDENT_AMBULATORY_CARE_PROVIDER_SITE_OTHER): Payer: Managed Care, Other (non HMO)

## 2015-10-01 ENCOUNTER — Encounter: Payer: Self-pay | Admitting: Family Medicine

## 2015-10-01 VITALS — BP 140/82 | HR 91 | Temp 98.5°F | Resp 12 | Ht 64.0 in | Wt 190.0 lb

## 2015-10-01 DIAGNOSIS — M25561 Pain in right knee: Secondary | ICD-10-CM | POA: Diagnosis not present

## 2015-10-01 DIAGNOSIS — S86811A Strain of other muscle(s) and tendon(s) at lower leg level, right leg, initial encounter: Secondary | ICD-10-CM | POA: Diagnosis not present

## 2015-10-01 DIAGNOSIS — S86911A Strain of unspecified muscle(s) and tendon(s) at lower leg level, right leg, initial encounter: Secondary | ICD-10-CM

## 2015-10-01 MED ORDER — IBUPROFEN 800 MG PO TABS: 800.0000 mg | ORAL_TABLET | Freq: Three times a day (TID) | ORAL | Status: DC | PRN

## 2015-10-01 MED ORDER — IBUPROFEN 200 MG PO TABS
600.0000 mg | ORAL_TABLET | Freq: Once | ORAL | Status: AC
  Administered 2015-10-01: 600 mg via ORAL

## 2015-10-01 MED ORDER — IBUPROFEN 200 MG PO TABS: 200.0000 mg | ORAL_TABLET | Freq: Once | ORAL | Status: DC

## 2015-10-01 MED ORDER — HYDROCODONE-ACETAMINOPHEN 5-325 MG PO TABS: 1.0000 | ORAL_TABLET | Freq: Four times a day (QID) | ORAL | Status: DC | PRN

## 2015-10-01 NOTE — Progress Notes (Signed)
Subjective:    Patient ID: Sydney Jackson, female    DOB: Oct 05, 1970, 45 y.o.   MRN: [id number]  10/01/2015  Knee Pain   HPI This 45 y.o. female presents for evaluation of R knee pain. Two days ago, trying to not fall and fell anyway. Happened really fast; not sure if twisted and grabbed it.  Thought it would feel better but still in pain despite Ibuprofen.  Unable to sleep well the day of injury. Yesterday, knee was really hurting. Had left over Hydrocodone and took it yesterday.  Felt much better with narcotic. When medication wore off, knee is throbbing. Took two hydrocodone yesterday; slept most the day.  Cannot walk or bear weight .Has been using crutches.  S/p R ACL and meniscus tears repair by Sydney Jackson.  Unable to fully bend after surgery; never complete full ROM after surgery.  LMP this month early 09-12-2015.   Review of Systems  Per HPI  Past Medical History  Diagnosis Date  . Hypertension   . Right ACL tear   . Right knee meniscal tear   . History of syncope     s/p  heart valve repair  1995  . S/P heart valve repair     1995--  pt does not remember which one   Past Surgical History  Procedure Laterality Date  . Retinal detachment repair w/ scleral buckle le Left 08-27-2006  . Cardiac valve surgery  1995 (approx.  age 75's)    pt does not remember which valve  . Knee arthroscopy Right 01/21/2014    Procedure: RIGHT  KNEE ARTHROSCOPY WITH DEBRIDEMENT PARTIAL MENISECTOMY AND AUTOGRAPH ACL RECONSTRUCTION;  Surgeon: Sydney Mcalpine, MD;  Location: [facility];  Service: Orthopedics;  Laterality: Right;   No Known Allergies  Social History   Social History  . Marital Status: Single    Spouse Name: N/A  . Number of Children: N/A  . Years of Education: N/A   Occupational History  . customer service [employer]   Social History Main Topics  . Smoking status: Never Smoker   . Smokeless tobacco: Never Used  . Alcohol Use: Yes     Comment: Glass or two  of wine on occasion, past history of heavy use   . Drug Use: No     Comment: hx   THC use, ended 10+ years ago-- (01-17-2014   DENIES  . Sexual Activity:    Partners: Male   Other Topics Concern  . Not on file   Social History Narrative   Sydney Jackson was born and grew up in [place]. She reports that her childhood was "okay." Her biological father left when she was 31 or 25 years of age. She has 7 half-siblings by her father, and one half-sibling by her mother. She graduated high school and attended one year of college. She has never been married. She has 4 children, a 28 year old son, a 41 year old son, a 76 year old daughter, and her 75 year old daughter. Her 5 year old and 69 year old live with her now. 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 She denies any legal problems. She affiliates as a Loss adjuster, chartered. She reports that she has one friend who she uses for social support.   History reviewed. No pertinent family history.     Objective:    BP 140/82 mmHg  Pulse 91  Temp(Src) 98.5 F (36.9 C) (Oral)  Resp 12  Ht 5\' 4"  (1.626 m)  Wt 190 lb (86.183 kg)  BMI 32.60 kg/m2  SpO2 98%  LMP 08/31/2015 Physical Exam  Constitutional: She is oriented to person, place, and time. She appears well-developed and well-nourished. No distress.  HENT:  Head: Normocephalic and atraumatic.  Eyes: Conjunctivae are normal. Pupils are equal, round, and reactive to light.  Neck: Normal range of motion. Neck supple.  Cardiovascular: Normal rate, regular rhythm and normal heart sounds.  Exam reveals no gallop and no friction rub.   No murmur heard. Pulmonary/Chest: Effort normal and breath sounds normal. She has no wheezes. She has no rales.  Musculoskeletal:       Right knee: She exhibits decreased range of motion. She exhibits no swelling, no effusion and no bony tenderness. Tenderness found. No patellar tendon tenderness  noted.  Neurological: She is alert and oriented to person, place, and time.  Skin: She is not diaphoretic.  Psychiatric: She has a normal mood and affect. Her behavior is normal.  Nursing note and vitals reviewed.       Assessment & Plan:   1. Right knee pain   2. Knee strain, right, initial encounter     Orders Placed This Encounter  Procedures  . DG Knee Complete 4 Views Right    Standing Status: Future     Number of Occurrences: 1     Standing Expiration Date: 09/30/2016    Order Specific Question:  Reason for Exam (SYMPTOM  OR DIAGNOSIS REQUIRED)    Answer:  R lateral knee pain after fall    Order Specific Question:  Is the patient pregnant?    Answer:  No    Order Specific Question:  Preferred imaging location?    Answer:  External  . Ambulatory referral to Orthopedic Surgery    Referral Priority:  Routine    Referral Type:  Surgical    Referral Reason:  Specialty Services Required    Requested Specialty:  Orthopedic Surgery    Number of Visits Requested:  1   Meds ordered this encounter  Medications  . DISCONTD: ibuprofen (ADVIL,MOTRIN) tablet 200 mg    Sig:   . ibuprofen (ADVIL,MOTRIN) tablet 600 mg    Sig:   . ibuprofen (ADVIL,MOTRIN) 800 MG tablet    Sig: Take 1 tablet (800 mg total) by mouth every 8 (eight) hours as needed.    Dispense:  30 tablet    Refill:  0  . HYDROcodone-acetaminophen (NORCO/VICODIN) 5-325 MG tablet    Sig: Take 1 tablet by mouth every 6 (six) hours as needed for moderate pain.    Dispense:  30 tablet    Refill:  0    No Follow-up on file.    Sydney Jackson, M.D. [name] [address] [place], [place]  [address] [phone] phone [phone] fax

## 2016-03-26 ENCOUNTER — Emergency Department (HOSPITAL_COMMUNITY)
Admission: EM | Admit: 2016-03-26 | Discharge: 2016-03-26 | Disposition: A | Discharge status: Home / Self Care | Payer: BLUE CROSS/BLUE SHIELD | Attending: Emergency Medicine | Admitting: Emergency Medicine

## 2016-03-26 ENCOUNTER — Encounter (HOSPITAL_COMMUNITY): Payer: Self-pay

## 2016-03-26 ENCOUNTER — Emergency Department (HOSPITAL_COMMUNITY): Payer: BLUE CROSS/BLUE SHIELD

## 2016-03-26 DIAGNOSIS — I10 Essential (primary) hypertension: Secondary | ICD-10-CM | POA: Insufficient documentation

## 2016-03-26 DIAGNOSIS — Z79899 Other long term (current) drug therapy: Secondary | ICD-10-CM | POA: Diagnosis not present

## 2016-03-26 DIAGNOSIS — J40 Bronchitis, not specified as acute or chronic: Secondary | ICD-10-CM | POA: Diagnosis not present

## 2016-03-26 DIAGNOSIS — R1013 Epigastric pain: Secondary | ICD-10-CM | POA: Diagnosis not present

## 2016-03-26 DIAGNOSIS — R7989 Other specified abnormal findings of blood chemistry: Secondary | ICD-10-CM

## 2016-03-26 DIAGNOSIS — R945 Abnormal results of liver function studies: Secondary | ICD-10-CM | POA: Diagnosis not present

## 2016-03-26 DIAGNOSIS — R05 Cough: Secondary | ICD-10-CM | POA: Diagnosis present

## 2016-03-26 LAB — CBC
HCT: 35.6 % — ABNORMAL LOW (ref 36.0–46.0)
Hemoglobin: 11.5 g/dL — ABNORMAL LOW (ref 12.0–15.0)
MCH: 25.2 pg — ABNORMAL LOW (ref 26.0–34.0)
MCHC: 32.3 g/dL (ref 30.0–36.0)
MCV: 78.1 fL (ref 78.0–100.0)
Platelets: 467 10*3/uL — ABNORMAL HIGH (ref 150–400)
RBC: 4.56 MIL/uL (ref 3.87–5.11)
RDW: 15.3 % (ref 11.5–15.5)
WBC: 7 10*3/uL (ref 4.0–10.5)

## 2016-03-26 LAB — URINE MICROSCOPIC-ADD ON

## 2016-03-26 LAB — COMPREHENSIVE METABOLIC PANEL
ALT: 107 U/L — ABNORMAL HIGH (ref 14–54)
AST: 184 U/L — ABNORMAL HIGH (ref 15–41)
Albumin: 3.7 g/dL (ref 3.5–5.0)
Alkaline Phosphatase: 101 U/L (ref 38–126)
Anion gap: 5 (ref 5–15)
BUN: 12 mg/dL (ref 6–20)
CO2: 26 mmol/L (ref 22–32)
Calcium: 8.7 mg/dL — ABNORMAL LOW (ref 8.9–10.3)
Chloride: 104 mmol/L (ref 101–111)
Creatinine, Ser: 0.78 mg/dL (ref 0.44–1.00)
GFR calc Af Amer: >60 mL/min (ref >60)
GFR calc non Af Amer: >60 mL/min (ref >60)
Glucose, Bld: 86 mg/dL (ref 65–99)
Potassium: 3.9 mmol/L (ref 3.5–5.1)
Sodium: 135 mmol/L (ref 135–145)
Total Bilirubin: 0.5 mg/dL (ref 0.3–1.2)
Total Protein: 7.4 g/dL (ref 6.5–8.1)

## 2016-03-26 LAB — URINALYSIS, ROUTINE W REFLEX MICROSCOPIC
Bilirubin Urine: NEGATIVE
Glucose, UA: NEGATIVE mg/dL
Hgb urine dipstick: NEGATIVE
Ketones, ur: NEGATIVE mg/dL
Nitrite: NEGATIVE
Protein, ur: NEGATIVE mg/dL
Specific Gravity, Urine: 1.012 (ref 1.005–1.030)
pH: 5.5 (ref 5.0–8.0)

## 2016-03-26 LAB — PREGNANCY, URINE: Preg Test, Ur: NEGATIVE

## 2016-03-26 LAB — LIPASE, BLOOD: Lipase: 23 U/L (ref 11–51)

## 2016-03-26 MED ORDER — IPRATROPIUM-ALBUTEROL 0.5-2.5 (3) MG/3ML IN SOLN
3.0000 mL | Freq: Once | RESPIRATORY_TRACT | Status: AC
  Administered 2016-03-26: 3 mL via RESPIRATORY_TRACT
  Filled 2016-03-26: qty 3

## 2016-03-26 MED ORDER — MORPHINE SULFATE (PF) 4 MG/ML IV SOLN
4.0000 mg | Freq: Once | INTRAVENOUS | Status: AC
Start: 2016-03-26 — End: 2016-03-26
  Administered 2016-03-26: 4 mg via INTRAVENOUS
  Filled 2016-03-26: qty 1

## 2016-03-26 MED ORDER — ONDANSETRON HCL 4 MG/2ML IJ SOLN
4.0000 mg | Freq: Once | INTRAMUSCULAR | Status: AC
  Administered 2016-03-26: 4 mg via INTRAVENOUS
  Filled 2016-03-26: qty 2

## 2016-03-26 MED ORDER — GI COCKTAIL ~~LOC~~
30.0000 mL | Freq: Once | ORAL | Status: AC
  Administered 2016-03-26: 30 mL via ORAL
  Filled 2016-03-26: qty 30

## 2016-03-26 MED ORDER — ALBUTEROL SULFATE HFA 108 (90 BASE) MCG/ACT IN AERS
2.0000 | INHALATION_SPRAY | Freq: Once | RESPIRATORY_TRACT | Status: AC
  Administered 2016-03-26: 2 via RESPIRATORY_TRACT
  Filled 2016-03-26: qty 6.7

## 2016-03-26 MED ORDER — SODIUM CHLORIDE 0.9 % IV BOLUS (SEPSIS)
1000.0000 mL | Freq: Once | INTRAVENOUS | Status: AC
Start: 2016-03-26 — End: 2016-03-26
  Administered 2016-03-26: 17:00:00 via INTRAVENOUS

## 2016-03-26 MED ORDER — METHYLPREDNISOLONE SODIUM SUCC 125 MG IJ SOLR
125.0000 mg | Freq: Once | INTRAMUSCULAR | Status: AC
  Administered 2016-03-26: 125 mg via INTRAVENOUS
  Filled 2016-03-26: qty 2

## 2016-03-26 MED ORDER — AZITHROMYCIN 250 MG PO TABS: 250.0000 mg | ORAL_TABLET | Freq: Every day | ORAL | 0 refills | Status: DC

## 2016-03-26 MED ORDER — ALBUTEROL SULFATE (2.5 MG/3ML) 0.083% IN NEBU: 2.5000 mg | INHALATION_SOLUTION | Freq: Four times a day (QID) | RESPIRATORY_TRACT | 12 refills | Status: DC | PRN

## 2016-03-26 MED ORDER — HYDROCODONE-HOMATROPINE 5-1.5 MG/5ML PO SYRP: 5.0000 mL | ORAL_SOLUTION | Freq: Four times a day (QID) | ORAL | 0 refills | Status: DC | PRN

## 2016-03-26 MED ORDER — PREDNISONE 50 MG PO TABS: 50.0000 mg | ORAL_TABLET | Freq: Every day | ORAL | 0 refills | Status: DC

## 2016-03-26 NOTE — ED Notes (Signed)
Ultrasound at bedside

## 2016-03-26 NOTE — Discharge Instructions (Signed)
Use your inhaler 2 puffs every 4 hrs. Take prednisone daily. Take zpack until all gone. Rest. Drink plenty of fluids. Hycodan for cough only as needed. Follow up with your doctor for recheck. Return if worsening.

## 2016-03-26 NOTE — ED Notes (Signed)
Bed: [facility] Expected date:  Expected time:  Means of arrival:  Comments: Triaging in room

## 2016-03-26 NOTE — ED Triage Notes (Signed)
Pt presents with c/o cough that started approx 2 weeks ago. Pt has been seen at her doctor for seam and given antibiotics for bronchitis. Pt reports the cough is not getting any better. Pt c/o epigastric pain that started this morning, intermittent.

## 2016-03-26 NOTE — Progress Notes (Signed)
Unable to administer breathing tx at this time. Ultrasound at bedside. Patient with increased coughing and nausea (per patient). RN aware.

## 2016-03-26 NOTE — ED Provider Notes (Signed)
[facility]-EMERGENCY DEPT Provider Note   CSN: [id number] Arrival date & time: 03/26/16  1208     History   Chief Complaint Chief Complaint  Patient presents with  . Cough  . Abdominal Pain    HPI Sydney Jackson is a 45 y.o. female.  HPI Sydney Jackson is a 45 y.o. female with history of hypertension, presents to emergency department complaining of cough and abdominal pain. Patient states she has had a cough for approximately 2 weeks now. She states she was seen by her doctor and was placed on Augmentin and Tessalon. She is doing inhaler 3-4 times a day. She states cough is persistent with clear mucus. Reports subjective fevers. Patient states that this morning she developed severe epigastric abdominal pain radiating to the right back. She reports associated nausea and vomiting. States multiple episode of emesis. Denies history of similar pain in the past. States she has had diarrhea for the last several days due to her antibiotics. Denies urinary symptoms. Denies pregnancy. No vaginal discharge or bleeding.  Past Medical History:  Diagnosis Date  . History of syncope    s/p  heart valve repair  1995  . Hypertension   . Right ACL tear   . Right knee meniscal tear   . S/P heart valve repair    1995--  pt does not remember which one    Patient Active Problem List   Diagnosis Date Noted  . S/P ACL reconstruction 01/21/2014  . Hypertension 07/14/2012  . Generalized anxiety disorder 07/14/2012  . Depression 07/14/2012  . Retinal detachment 07/14/2012    Past Surgical History:  Procedure Laterality Date  . CARDIAC VALVE SURGERY  1995 (approx.  age 45's)   pt does not remember which valve  . KNEE ARTHROSCOPY Right 01/21/2014   Procedure: RIGHT  KNEE ARTHROSCOPY WITH DEBRIDEMENT PARTIAL MENISECTOMY AND AUTOGRAPH ACL RECONSTRUCTION;  Surgeon: Eugenia Mcalpineobert Collins, MD;  [facility] [place];  Service: Orthopedics;  Laterality: Right;  . RETINAL DETACHMENT REPAIR W/ SCLERAL  BUCKLE LE Left 08-27-2006    OB History    No data available       Home Medications    Prior to Admission medications   Medication Sig Start Date End Date Taking? Authorizing Provider  amoxicillin-clavulanate (AUGMENTIN) 875-125 MG tablet Take 1 tablet by mouth 2 (two) times daily. 03/18/16  Yes Historical Provider, MD  benzonatate (TESSALON) 100 MG capsule Take 100 mg by mouth 3 (three) times daily as needed. 03/18/16  Yes Historical Provider, MD  PROAIR HFA 108 (90 Base) MCG/ACT inhaler Inhale 2 puffs into the lungs 3 (three) times daily as needed for wheezing, shortness of breath or cough. 03/18/16  Yes Historical Provider, MD  sertraline (ZOLOFT) 100 MG tablet Take 100 mg by mouth daily. 12/23/15  Yes Historical Provider, MD  valsartan-hydrochlorothiazide (DIOVAN-HCT) 320-25 MG tablet Take 1 tablet by mouth daily. 03/18/16  Yes Historical Provider, MD    Family History No family history on file.  Social History Social History  Substance Use Topics  . Smoking status: Never Smoker  . Smokeless tobacco: Never Used  . Alcohol use Yes     Comment: Glass or two of wine on occasion, past history of heavy use      Allergies   Review of patient's allergies indicates no known allergies.   Review of Systems Review of Systems  Constitutional: Negative for chills and fever.  Respiratory: Positive for cough, chest tightness and shortness of breath.   Cardiovascular: Negative for chest pain,  palpitations and leg swelling.  Gastrointestinal: Positive for abdominal pain, diarrhea, nausea and vomiting.  Genitourinary: Negative for dysuria, flank pain, pelvic pain, vaginal bleeding, vaginal discharge and vaginal pain.  Musculoskeletal: Negative for arthralgias, myalgias, neck pain and neck stiffness.  Skin: Negative for rash.  Neurological: Negative for dizziness, weakness and headaches.  All other systems reviewed and are negative.    Physical Exam Updated Vital Signs BP (!) 158/107 (BP  Location: Left Arm)   Pulse 114   Temp 99.2 F (37.3 C) (Oral)   Resp 16   LMP 03/09/2016   SpO2 99%   Physical Exam  Constitutional: She appears well-developed and well-nourished. No distress.  HENT:  Head: Normocephalic.  Eyes: Conjunctivae are normal.  Neck: Neck supple.  Cardiovascular: Normal rate, regular rhythm and normal heart sounds.   Pulmonary/Chest: Effort normal. No respiratory distress. She has wheezes. She has no rales.  Expiratory wheezes bilaterally  Abdominal: Soft. Bowel sounds are normal. She exhibits no distension. There is tenderness. There is no rebound.  Tender to palpation epigastric area and right upper quadrant.  Musculoskeletal: She exhibits no edema.  Neurological: She is alert.  Skin: Skin is warm and dry. Capillary refill takes less than 2 seconds.  Psychiatric: She has a normal mood and affect. Her behavior is normal.  Nursing note and vitals reviewed.    ED Treatments / Results  Labs (all labs ordered are listed, but only abnormal results are displayed) Labs Reviewed  COMPREHENSIVE METABOLIC PANEL - Abnormal; Notable for the following:       Result Value   Calcium 8.7 (*)    AST 184 (*)    ALT 107 (*)    All other components within normal limits  CBC - Abnormal; Notable for the following:    Hemoglobin 11.5 (*)    HCT 35.6 (*)    MCH 25.2 (*)    Platelets 467 (*)    All other components within normal limits  URINALYSIS, ROUTINE W REFLEX MICROSCOPIC (NOT AT [facility]) - Abnormal; Notable for the following:    Leukocytes, UA SMALL (*)    All other components within normal limits  URINE MICROSCOPIC-ADD ON - Abnormal; Notable for the following:    Squamous Epithelial / LPF 6-30 (*)    Bacteria, UA RARE (*)    All other components within normal limits  LIPASE, BLOOD  PREGNANCY, URINE  POC URINE PREG, ED    EKG  EKG Interpretation None       Radiology Dg Chest 2 View  Result Date: 03/26/2016 CLINICAL DATA:  Cough for 2 weeks EXAM:  CHEST  2 VIEW COMPARISON:  08/27/2006 FINDINGS: The heart size and mediastinal contours are within normal limits. Both lungs are clear. The visualized skeletal structures are unremarkable. IMPRESSION: No active cardiopulmonary disease. Electronically Signed   By: Alcide Clever M.D.   On: 03/26/2016 14:51   US Abdomen Complete  Result Date: 03/26/2016 CLINICAL DATA:  Abdominal pain, right upper quadrant pain EXAM: ABDOMEN ULTRASOUND COMPLETE COMPARISON:  None FINDINGS: Gallbladder: No gallstones or wall thickening visualized. No sonographic Murphy sign noted by sonographer. Common bile duct: Diameter: 3.6 mm within normal limits Liver: No focal lesion identified. Within normal limits in parenchymal echogenicity. IVC: No abnormality visualized. Pancreas: Visualized portion unremarkable. Limited assessment due to abundant bowel gas Spleen: Size and appearance within normal limits. Measures 5 cm in length Right Kidney: Length: 12 cm in length. Echogenicity within normal limits. No mass or hydronephrosis visualized. Left Kidney: Length: 10.5  cm. Echogenicity within normal limits. No mass or hydronephrosis visualized. Abdominal aorta: No aneurysm visualized. Measures 1.7 cm in diameter. Other findings: None. IMPRESSION: 1. No gallstones are noted within gallbladder.  Normal CBD. 2. No hydronephrosis. 3. No aortic aneurysm. Electronically Signed   By: Natasha Mead M.D.   On: 03/26/2016 16:41    Procedures Procedures (including critical care time)  Medications Ordered in ED Medications  albuterol (PROVENTIL HFA;VENTOLIN HFA) 108 (90 Base) MCG/ACT inhaler 2 puff (not administered)  ipratropium-albuterol (DUONEB) 0.5-2.5 (3) MG/3ML nebulizer solution 3 mL (3 mLs Nebulization Given 03/26/16 1634)  methylPREDNISolone sodium succinate (SOLU-MEDROL) 125 mg/2 mL injection 125 mg (125 mg Intravenous Given 03/26/16 1631)  morphine 4 MG/ML injection 4 mg (4 mg Intravenous Given 03/26/16 1632)  ondansetron (ZOFRAN) injection  4 mg (4 mg Intravenous Given 03/26/16 1631)  sodium chloride 0.9 % bolus 1,000 mL ( Intravenous New Bag/Given 03/26/16 1640)  gi cocktail (Maalox,Lidocaine,Donnatal) (30 mLs Oral Given 03/26/16 1736)  ipratropium-albuterol (DUONEB) 0.5-2.5 (3) MG/3ML nebulizer solution 3 mL (3 mLs Nebulization Given 03/26/16 1729)  ipratropium-albuterol (DUONEB) 0.5-2.5 (3) MG/3ML nebulizer solution 3 mL (3 mLs Nebulization Given 03/26/16 1918)     Initial Impression / Assessment and Plan / ED Course  I have reviewed the triage vital signs and the nursing notes.  Pertinent labs & imaging results that were available during my care of the patient were reviewed by me and considered in my medical decision making (see chart for details).  Clinical Course  Comment By Time  Patient seen and examined. Patient with persistent cough for 2 weeks, is on Augmentin at this time. She is not reporting right upper quadrant and epigastric abdominal pain, nausea, vomiting and persistent cough. Patient is tachycardic with heart rate of 114, temp is 99.2. She reports subjective fevers at home. Will get chest x-ray, labs, all shown abdomen. Patient is wheezing. Breathing treatment ordered. Will add steroids as well. Jaynie Crumble, PA-C 08/15 1511  Delay in receiving medications and duoneb due to pt being in the [place]. Will reassess.  Jaynie Crumble, PA-C 08/15 1653  Patient feels better. She received 3 breathing treatments. Wheezing resolved. Chest x-ray negative. Ultrasound negative. LFTs elevated, will need close follow-up. Patient not vomiting. His she states the pain comes and goes, "like cramps." Suspect a bowel pain may be due to abdominal spasms from coughing. If persists, instructed to follow up or return for further imaging. Home with prednisone, Z-Pak to cover for atypical infection, inhalers and nebs treatments. Follow with primary care doctor. Patient voiced understanding. Jaynie Crumble, PA-C 08/15 1956      Final  Clinical Impressions(s) / ED Diagnoses   Final diagnoses:  Bronchitis  Epigastric pain  Elevated LFTs    New Prescriptions New Prescriptions   ALBUTEROL (PROVENTIL) (2.5 MG/3ML) 0.083% NEBULIZER SOLUTION    Take 3 mLs (2.5 mg total) by nebulization every 6 (six) hours as needed for wheezing or shortness of breath.   AZITHROMYCIN (ZITHROMAX) 250 MG TABLET    Take 1 tablet (250 mg total) by mouth daily. Take first 2 tablets together, then 1 every day until finished.   HYDROCODONE-HOMATROPINE (HYCODAN) 5-1.5 MG/5ML SYRUP    Take 5 mLs by mouth every 6 (six) hours as needed for cough.   PREDNISONE (DELTASONE) 50 MG TABLET    Take 1 tablet (50 mg total) by mouth daily.     Jaynie Crumble, PA-C 03/26/16 2001    Alvira Monday, MD 03/28/16 [phone]

## 2016-03-26 NOTE — ED Notes (Signed)
Respiratory Made aware of breathing treatment due.  Will come administer after finish report.

## 2016-05-02 DIAGNOSIS — F410 Panic disorder [episodic paroxysmal anxiety] without agoraphobia: Secondary | ICD-10-CM | POA: Diagnosis not present

## 2016-05-02 DIAGNOSIS — F331 Major depressive disorder, recurrent, moderate: Secondary | ICD-10-CM | POA: Diagnosis not present

## 2016-05-02 DIAGNOSIS — F411 Generalized anxiety disorder: Secondary | ICD-10-CM | POA: Diagnosis not present

## 2016-05-17 DIAGNOSIS — F331 Major depressive disorder, recurrent, moderate: Secondary | ICD-10-CM | POA: Diagnosis not present

## 2016-05-17 DIAGNOSIS — F411 Generalized anxiety disorder: Secondary | ICD-10-CM | POA: Diagnosis not present

## 2016-05-17 DIAGNOSIS — F410 Panic disorder [episodic paroxysmal anxiety] without agoraphobia: Secondary | ICD-10-CM | POA: Diagnosis not present

## 2016-06-10 DIAGNOSIS — F331 Major depressive disorder, recurrent, moderate: Secondary | ICD-10-CM | POA: Diagnosis not present

## 2016-06-10 DIAGNOSIS — F411 Generalized anxiety disorder: Secondary | ICD-10-CM | POA: Diagnosis not present

## 2016-06-10 DIAGNOSIS — F410 Panic disorder [episodic paroxysmal anxiety] without agoraphobia: Secondary | ICD-10-CM | POA: Diagnosis not present

## 2016-08-06 DIAGNOSIS — F410 Panic disorder [episodic paroxysmal anxiety] without agoraphobia: Secondary | ICD-10-CM | POA: Diagnosis not present

## 2016-08-06 DIAGNOSIS — F411 Generalized anxiety disorder: Secondary | ICD-10-CM | POA: Diagnosis not present

## 2016-08-06 DIAGNOSIS — F331 Major depressive disorder, recurrent, moderate: Secondary | ICD-10-CM | POA: Diagnosis not present

## 2017-01-08 DIAGNOSIS — J018 Other acute sinusitis: Secondary | ICD-10-CM | POA: Diagnosis not present

## 2017-01-08 DIAGNOSIS — J209 Acute bronchitis, unspecified: Secondary | ICD-10-CM | POA: Diagnosis not present

## 2017-10-09 DIAGNOSIS — Z13220 Encounter for screening for lipoid disorders: Secondary | ICD-10-CM | POA: Diagnosis not present

## 2017-10-09 DIAGNOSIS — I10 Essential (primary) hypertension: Secondary | ICD-10-CM | POA: Diagnosis not present

## 2017-10-09 DIAGNOSIS — Z713 Dietary counseling and surveillance: Secondary | ICD-10-CM | POA: Diagnosis not present

## 2017-10-09 DIAGNOSIS — Z136 Encounter for screening for cardiovascular disorders: Secondary | ICD-10-CM | POA: Diagnosis not present

## 2017-10-09 DIAGNOSIS — Z6837 Body mass index (BMI) 37.0-37.9, adult: Secondary | ICD-10-CM | POA: Diagnosis not present

## 2017-10-09 DIAGNOSIS — Z760 Encounter for issue of repeat prescription: Secondary | ICD-10-CM | POA: Diagnosis not present

## 2017-11-18 DIAGNOSIS — F411 Generalized anxiety disorder: Secondary | ICD-10-CM | POA: Diagnosis not present

## 2017-11-18 DIAGNOSIS — F410 Panic disorder [episodic paroxysmal anxiety] without agoraphobia: Secondary | ICD-10-CM | POA: Diagnosis not present

## 2017-11-18 DIAGNOSIS — F331 Major depressive disorder, recurrent, moderate: Secondary | ICD-10-CM | POA: Diagnosis not present

## 2017-12-15 DIAGNOSIS — F331 Major depressive disorder, recurrent, moderate: Secondary | ICD-10-CM | POA: Diagnosis not present

## 2017-12-15 DIAGNOSIS — F411 Generalized anxiety disorder: Secondary | ICD-10-CM | POA: Diagnosis not present

## 2017-12-15 DIAGNOSIS — F410 Panic disorder [episodic paroxysmal anxiety] without agoraphobia: Secondary | ICD-10-CM | POA: Diagnosis not present

## 2017-12-17 DIAGNOSIS — F410 Panic disorder [episodic paroxysmal anxiety] without agoraphobia: Secondary | ICD-10-CM | POA: Diagnosis not present

## 2017-12-17 DIAGNOSIS — F411 Generalized anxiety disorder: Secondary | ICD-10-CM | POA: Diagnosis not present

## 2017-12-17 DIAGNOSIS — F331 Major depressive disorder, recurrent, moderate: Secondary | ICD-10-CM | POA: Diagnosis not present

## 2017-12-30 DIAGNOSIS — M549 Dorsalgia, unspecified: Secondary | ICD-10-CM | POA: Diagnosis not present

## 2017-12-30 DIAGNOSIS — Z3A21 21 weeks gestation of pregnancy: Secondary | ICD-10-CM | POA: Diagnosis not present

## 2017-12-30 DIAGNOSIS — O9989 Other specified diseases and conditions complicating pregnancy, childbirth and the puerperium: Secondary | ICD-10-CM | POA: Diagnosis not present

## 2017-12-31 DIAGNOSIS — F411 Generalized anxiety disorder: Secondary | ICD-10-CM | POA: Diagnosis not present

## 2017-12-31 DIAGNOSIS — R51 Headache: Secondary | ICD-10-CM | POA: Diagnosis not present

## 2018-01-12 DIAGNOSIS — F331 Major depressive disorder, recurrent, moderate: Secondary | ICD-10-CM | POA: Diagnosis not present

## 2018-01-12 DIAGNOSIS — F410 Panic disorder [episodic paroxysmal anxiety] without agoraphobia: Secondary | ICD-10-CM | POA: Diagnosis not present

## 2018-01-12 DIAGNOSIS — F411 Generalized anxiety disorder: Secondary | ICD-10-CM | POA: Diagnosis not present

## 2018-01-21 DIAGNOSIS — F411 Generalized anxiety disorder: Secondary | ICD-10-CM | POA: Diagnosis not present

## 2018-01-21 DIAGNOSIS — F331 Major depressive disorder, recurrent, moderate: Secondary | ICD-10-CM | POA: Diagnosis not present

## 2018-01-21 DIAGNOSIS — F410 Panic disorder [episodic paroxysmal anxiety] without agoraphobia: Secondary | ICD-10-CM | POA: Diagnosis not present

## 2018-01-26 DIAGNOSIS — F410 Panic disorder [episodic paroxysmal anxiety] without agoraphobia: Secondary | ICD-10-CM | POA: Diagnosis not present

## 2018-01-26 DIAGNOSIS — F411 Generalized anxiety disorder: Secondary | ICD-10-CM | POA: Diagnosis not present

## 2018-01-26 DIAGNOSIS — F331 Major depressive disorder, recurrent, moderate: Secondary | ICD-10-CM | POA: Diagnosis not present

## 2018-01-27 DIAGNOSIS — R51 Headache: Secondary | ICD-10-CM | POA: Diagnosis not present

## 2018-01-27 DIAGNOSIS — F411 Generalized anxiety disorder: Secondary | ICD-10-CM | POA: Diagnosis not present

## 2018-02-09 DIAGNOSIS — F331 Major depressive disorder, recurrent, moderate: Secondary | ICD-10-CM | POA: Diagnosis not present

## 2018-02-09 DIAGNOSIS — F410 Panic disorder [episodic paroxysmal anxiety] without agoraphobia: Secondary | ICD-10-CM | POA: Diagnosis not present

## 2018-02-09 DIAGNOSIS — F411 Generalized anxiety disorder: Secondary | ICD-10-CM | POA: Diagnosis not present

## 2018-02-23 DIAGNOSIS — F331 Major depressive disorder, recurrent, moderate: Secondary | ICD-10-CM | POA: Diagnosis not present

## 2018-02-23 DIAGNOSIS — F411 Generalized anxiety disorder: Secondary | ICD-10-CM | POA: Diagnosis not present

## 2018-02-23 DIAGNOSIS — F410 Panic disorder [episodic paroxysmal anxiety] without agoraphobia: Secondary | ICD-10-CM | POA: Diagnosis not present

## 2018-03-04 DIAGNOSIS — I10 Essential (primary) hypertension: Secondary | ICD-10-CM | POA: Diagnosis not present

## 2018-03-06 DIAGNOSIS — F331 Major depressive disorder, recurrent, moderate: Secondary | ICD-10-CM | POA: Diagnosis not present

## 2018-03-06 DIAGNOSIS — F411 Generalized anxiety disorder: Secondary | ICD-10-CM | POA: Diagnosis not present

## 2018-03-06 DIAGNOSIS — F410 Panic disorder [episodic paroxysmal anxiety] without agoraphobia: Secondary | ICD-10-CM | POA: Diagnosis not present

## 2018-03-24 DIAGNOSIS — F411 Generalized anxiety disorder: Secondary | ICD-10-CM | POA: Diagnosis not present

## 2018-03-24 DIAGNOSIS — F410 Panic disorder [episodic paroxysmal anxiety] without agoraphobia: Secondary | ICD-10-CM | POA: Diagnosis not present

## 2018-03-24 DIAGNOSIS — F331 Major depressive disorder, recurrent, moderate: Secondary | ICD-10-CM | POA: Diagnosis not present

## 2018-04-03 DIAGNOSIS — I10 Essential (primary) hypertension: Secondary | ICD-10-CM | POA: Diagnosis not present

## 2018-04-06 DIAGNOSIS — F331 Major depressive disorder, recurrent, moderate: Secondary | ICD-10-CM | POA: Diagnosis not present

## 2018-04-06 DIAGNOSIS — F411 Generalized anxiety disorder: Secondary | ICD-10-CM | POA: Diagnosis not present

## 2018-04-06 DIAGNOSIS — F410 Panic disorder [episodic paroxysmal anxiety] without agoraphobia: Secondary | ICD-10-CM | POA: Diagnosis not present

## 2018-05-13 DIAGNOSIS — L309 Dermatitis, unspecified: Secondary | ICD-10-CM | POA: Diagnosis not present

## 2018-06-03 DIAGNOSIS — F410 Panic disorder [episodic paroxysmal anxiety] without agoraphobia: Secondary | ICD-10-CM | POA: Diagnosis not present

## 2018-06-03 DIAGNOSIS — F331 Major depressive disorder, recurrent, moderate: Secondary | ICD-10-CM | POA: Diagnosis not present

## 2018-06-03 DIAGNOSIS — F411 Generalized anxiety disorder: Secondary | ICD-10-CM | POA: Diagnosis not present

## 2018-06-17 DIAGNOSIS — M545 Low back pain: Secondary | ICD-10-CM | POA: Diagnosis not present

## 2018-06-17 DIAGNOSIS — D225 Melanocytic nevi of trunk: Secondary | ICD-10-CM | POA: Diagnosis not present

## 2018-06-17 DIAGNOSIS — L308 Other specified dermatitis: Secondary | ICD-10-CM | POA: Diagnosis not present

## 2018-06-17 DIAGNOSIS — L818 Other specified disorders of pigmentation: Secondary | ICD-10-CM | POA: Diagnosis not present

## 2018-06-17 DIAGNOSIS — Z1283 Encounter for screening for malignant neoplasm of skin: Secondary | ICD-10-CM | POA: Diagnosis not present

## 2018-07-30 ENCOUNTER — Other Ambulatory Visit (HOSPITAL_COMMUNITY)
Admission: RE | Admit: 2018-07-30 | Discharge: 2018-07-30 | Disposition: A | Discharge status: Home / Self Care | Payer: BLUE CROSS/BLUE SHIELD | Source: Ambulatory Visit | Attending: Family Medicine | Admitting: Family Medicine

## 2018-07-30 ENCOUNTER — Other Ambulatory Visit: Payer: Self-pay | Admitting: Family Medicine

## 2018-07-30 DIAGNOSIS — R079 Chest pain, unspecified: Secondary | ICD-10-CM | POA: Diagnosis not present

## 2018-07-30 DIAGNOSIS — F419 Anxiety disorder, unspecified: Secondary | ICD-10-CM | POA: Diagnosis not present

## 2018-07-30 DIAGNOSIS — Z0000 Encounter for general adult medical examination without abnormal findings: Secondary | ICD-10-CM | POA: Diagnosis not present

## 2018-07-30 DIAGNOSIS — I10 Essential (primary) hypertension: Secondary | ICD-10-CM | POA: Diagnosis not present

## 2018-07-30 DIAGNOSIS — Z23 Encounter for immunization: Secondary | ICD-10-CM | POA: Diagnosis not present

## 2018-08-03 DIAGNOSIS — Z0000 Encounter for general adult medical examination without abnormal findings: Secondary | ICD-10-CM | POA: Diagnosis not present

## 2018-08-03 DIAGNOSIS — Z113 Encounter for screening for infections with a predominantly sexual mode of transmission: Secondary | ICD-10-CM | POA: Diagnosis not present

## 2018-08-03 DIAGNOSIS — Z13220 Encounter for screening for lipoid disorders: Secondary | ICD-10-CM | POA: Diagnosis not present

## 2018-08-03 LAB — CYTOLOGY - PAP
Diagnosis: NEGATIVE
HPV: NOT DETECTED

## 2018-08-17 DIAGNOSIS — Z0189 Encounter for other specified special examinations: Secondary | ICD-10-CM | POA: Diagnosis not present

## 2018-08-17 DIAGNOSIS — I10 Essential (primary) hypertension: Secondary | ICD-10-CM | POA: Diagnosis not present

## 2018-08-17 DIAGNOSIS — R002 Palpitations: Secondary | ICD-10-CM | POA: Diagnosis not present

## 2018-08-17 DIAGNOSIS — Z8679 Personal history of other diseases of the circulatory system: Secondary | ICD-10-CM | POA: Diagnosis not present

## 2018-09-01 DIAGNOSIS — R002 Palpitations: Secondary | ICD-10-CM | POA: Diagnosis not present

## 2018-09-01 DIAGNOSIS — I10 Essential (primary) hypertension: Secondary | ICD-10-CM | POA: Diagnosis not present

## 2018-09-14 DIAGNOSIS — R002 Palpitations: Secondary | ICD-10-CM | POA: Diagnosis not present

## 2018-10-02 ENCOUNTER — Encounter (HOSPITAL_COMMUNITY): Payer: Self-pay

## 2018-10-05 ENCOUNTER — Other Ambulatory Visit: Payer: Self-pay | Admitting: Cardiology

## 2018-10-05 DIAGNOSIS — I4729 Other ventricular tachycardia: Secondary | ICD-10-CM

## 2018-10-05 DIAGNOSIS — I472 Ventricular tachycardia: Secondary | ICD-10-CM

## 2018-10-06 ENCOUNTER — Ambulatory Visit: Payer: Managed Care, Other (non HMO) | Admitting: Cardiology

## 2018-10-14 ENCOUNTER — Other Ambulatory Visit: Payer: Self-pay

## 2018-10-14 ENCOUNTER — Emergency Department (HOSPITAL_BASED_OUTPATIENT_CLINIC_OR_DEPARTMENT_OTHER): Payer: BLUE CROSS/BLUE SHIELD

## 2018-10-14 ENCOUNTER — Inpatient Hospital Stay (HOSPITAL_BASED_OUTPATIENT_CLINIC_OR_DEPARTMENT_OTHER)
Admission: EM | Admit: 2018-10-14 | Discharge: 2018-10-16 | DRG: 292 | Disposition: A | Discharge status: Home / Self Care | Payer: BLUE CROSS/BLUE SHIELD | Attending: Cardiology | Admitting: Cardiology

## 2018-10-14 ENCOUNTER — Encounter (HOSPITAL_BASED_OUTPATIENT_CLINIC_OR_DEPARTMENT_OTHER): Payer: Self-pay | Admitting: Emergency Medicine

## 2018-10-14 DIAGNOSIS — I472 Ventricular tachycardia: Secondary | ICD-10-CM | POA: Diagnosis present

## 2018-10-14 DIAGNOSIS — I160 Hypertensive urgency: Secondary | ICD-10-CM | POA: Diagnosis present

## 2018-10-14 DIAGNOSIS — I5022 Chronic systolic (congestive) heart failure: Secondary | ICD-10-CM

## 2018-10-14 DIAGNOSIS — Z833 Family history of diabetes mellitus: Secondary | ICD-10-CM

## 2018-10-14 DIAGNOSIS — I509 Heart failure, unspecified: Secondary | ICD-10-CM

## 2018-10-14 DIAGNOSIS — I110 Hypertensive heart disease with heart failure: Secondary | ICD-10-CM | POA: Diagnosis not present

## 2018-10-14 DIAGNOSIS — I5023 Acute on chronic systolic (congestive) heart failure: Secondary | ICD-10-CM | POA: Diagnosis not present

## 2018-10-14 DIAGNOSIS — M797 Fibromyalgia: Secondary | ICD-10-CM | POA: Diagnosis not present

## 2018-10-14 DIAGNOSIS — Z8249 Family history of ischemic heart disease and other diseases of the circulatory system: Secondary | ICD-10-CM | POA: Diagnosis not present

## 2018-10-14 DIAGNOSIS — I10 Essential (primary) hypertension: Secondary | ICD-10-CM | POA: Diagnosis present

## 2018-10-14 DIAGNOSIS — R079 Chest pain, unspecified: Secondary | ICD-10-CM

## 2018-10-14 DIAGNOSIS — Z87891 Personal history of nicotine dependence: Secondary | ICD-10-CM | POA: Diagnosis not present

## 2018-10-14 DIAGNOSIS — I43 Cardiomyopathy in diseases classified elsewhere: Secondary | ICD-10-CM | POA: Diagnosis present

## 2018-10-14 HISTORY — DX: Chronic systolic (congestive) heart failure: I50.22

## 2018-10-14 LAB — BASIC METABOLIC PANEL
Anion gap: 7 (ref 5–15)
BUN: 16 mg/dL (ref 6–20)
CO2: 23 mmol/L (ref 22–32)
Calcium: 8.5 mg/dL — ABNORMAL LOW (ref 8.9–10.3)
Chloride: 106 mmol/L (ref 98–111)
Creatinine, Ser: 0.84 mg/dL (ref 0.44–1.00)
GFR calc Af Amer: >60 mL/min (ref >60)
GFR calc non Af Amer: >60 mL/min (ref >60)
Glucose, Bld: 102 mg/dL — ABNORMAL HIGH (ref 70–99)
Potassium: 3.7 mmol/L (ref 3.5–5.1)
Sodium: 136 mmol/L (ref 135–145)

## 2018-10-14 LAB — CBC
HCT: 36.6 % (ref 36.0–46.0)
Hemoglobin: 10.9 g/dL — ABNORMAL LOW (ref 12.0–15.0)
MCH: 24.1 pg — ABNORMAL LOW (ref 26.0–34.0)
MCHC: 29.8 g/dL — ABNORMAL LOW (ref 30.0–36.0)
MCV: 81 fL (ref 80.0–100.0)
Platelets: 493 10*3/uL — ABNORMAL HIGH (ref 150–400)
RBC: 4.52 MIL/uL (ref 3.87–5.11)
RDW: 17.2 % — ABNORMAL HIGH (ref 11.5–15.5)
WBC: 5.7 10*3/uL (ref 4.0–10.5)
nRBC: 0 % (ref 0.0–0.2)

## 2018-10-14 LAB — TROPONIN I
Troponin I: <0.03 ng/mL (ref <0.03)
Troponin I: <0.03 ng/mL (ref <0.03)

## 2018-10-14 LAB — BRAIN NATRIURETIC PEPTIDE: B Natriuretic Peptide: 461.7 pg/mL — ABNORMAL HIGH (ref 0.0–100.0)

## 2018-10-14 MED ORDER — SODIUM CHLORIDE 0.9% FLUSH
3.0000 mL | Freq: Once | INTRAVENOUS | Status: DC
  Filled 2018-10-14: qty 3

## 2018-10-14 MED ORDER — SODIUM CHLORIDE 0.9% FLUSH
3.0000 mL | Freq: Two times a day (BID) | INTRAVENOUS | Status: DC
  Administered 2018-10-14 – 2018-10-15 (×3): 3 mL via INTRAVENOUS
  Filled 2018-10-14: qty 3

## 2018-10-14 MED ORDER — SODIUM CHLORIDE 0.9 % IV SOLN: 250.0000 mL | INTRAVENOUS | Status: DC | PRN

## 2018-10-14 MED ORDER — FUROSEMIDE 10 MG/ML IJ SOLN
40.0000 mg | Freq: Once | INTRAMUSCULAR | Status: AC
  Administered 2018-10-14: 40 mg via INTRAVENOUS
  Filled 2018-10-14: qty 4

## 2018-10-14 MED ORDER — ACETAMINOPHEN 325 MG PO TABS: 650.0000 mg | ORAL_TABLET | ORAL | Status: DC | PRN

## 2018-10-14 MED ORDER — AMLODIPINE BESYLATE 10 MG PO TABS
10.0000 mg | ORAL_TABLET | Freq: Every day | ORAL | Status: DC
  Administered 2018-10-14 – 2018-10-15 (×2): 10 mg via ORAL
  Filled 2018-10-14 (×2): qty 1

## 2018-10-14 MED ORDER — SODIUM CHLORIDE 0.9% FLUSH
3.0000 mL | INTRAVENOUS | Status: DC | PRN
  Filled 2018-10-14: qty 3

## 2018-10-14 MED ORDER — SACUBITRIL-VALSARTAN 49-51 MG PO TABS
1.0000 | ORAL_TABLET | Freq: Two times a day (BID) | ORAL | Status: DC
  Administered 2018-10-14 – 2018-10-16 (×4): 1 via ORAL
  Filled 2018-10-14 (×5): qty 1

## 2018-10-14 MED ORDER — ACETAMINOPHEN 500 MG PO TABS
1000.0000 mg | ORAL_TABLET | Freq: Once | ORAL | Status: AC
  Administered 2018-10-14: 1000 mg via ORAL
  Filled 2018-10-14: qty 2

## 2018-10-14 MED ORDER — ONDANSETRON HCL 4 MG/2ML IJ SOLN
4.0000 mg | Freq: Four times a day (QID) | INTRAMUSCULAR | Status: DC | PRN
  Administered 2018-10-15: 4 mg via INTRAVENOUS
  Filled 2018-10-14: qty 2

## 2018-10-14 MED ORDER — ALBUTEROL SULFATE (2.5 MG/3ML) 0.083% IN NEBU: 2.5000 mg | INHALATION_SOLUTION | Freq: Four times a day (QID) | RESPIRATORY_TRACT | Status: DC | PRN

## 2018-10-14 MED ORDER — HEPARIN SODIUM (PORCINE) 5000 UNIT/ML IJ SOLN
5000.0000 [IU] | Freq: Three times a day (TID) | INTRAMUSCULAR | Status: DC
  Administered 2018-10-14 – 2018-10-16 (×5): 5000 [IU] via SUBCUTANEOUS
  Filled 2018-10-14 (×5): qty 1

## 2018-10-14 MED ORDER — SPIRONOLACTONE 25 MG PO TABS
50.0000 mg | ORAL_TABLET | Freq: Every day | ORAL | Status: DC
  Administered 2018-10-14 – 2018-10-16 (×3): 50 mg via ORAL
  Filled 2018-10-14 (×3): qty 2

## 2018-10-14 MED ORDER — NITROGLYCERIN 0.4 MG SL SUBL
0.4000 mg | SUBLINGUAL_TABLET | SUBLINGUAL | Status: DC | PRN
  Administered 2018-10-14 (×2): 0.4 mg via SUBLINGUAL
  Filled 2018-10-14: qty 1

## 2018-10-14 MED ORDER — SERTRALINE HCL 100 MG PO TABS
100.0000 mg | ORAL_TABLET | Freq: Every day | ORAL | Status: DC
  Administered 2018-10-14 – 2018-10-16 (×3): 100 mg via ORAL
  Filled 2018-10-14 (×3): qty 1

## 2018-10-14 NOTE — ED Provider Notes (Signed)
[facility] EMERGENCY DEPARTMENT Provider Note   CSN: [id number] Arrival date & time: 10/14/18  1037    History   Chief Complaint Chief Complaint  Patient presents with  . Chest Pain  . Shortness of Breath    HPI Sydney Jackson is a 48 y.o. female.     Patient is a 48 year old female who has a history of hypertension, fibromyalgia, reported nonsustained V. tach who presents with chest pain or shortness of breath.  She states she has been feeling more fatigued over the last few days.  She started having some shortness of breath yesterday with some wheezing.  She also has had some chest tightness since yesterday.  She cannot lie flat although she can lay on her side.  She has some chronic leg swelling which is unchanged.  She recently has been seen by the APP at [facility] cardiology in [place] and states that she had an echocardiogram and is awaiting a stress test next week.  She does not know the results of the echocardiogram.     Past Medical History:  Diagnosis Date  . Depression   . Herpes simplex disease   . History of syncope    s/p  heart valve repair  1995  . Hypertension   . NSVT (nonsustained ventricular tachycardia) (HCC)   . Palpitations   . Right ACL tear   . Right knee meniscal tear   . S/P heart valve repair    1995--  pt does not remember which one    Patient Active Problem List   Diagnosis Date Noted  . S/P ACL reconstruction 01/21/2014  . Hypertension 07/14/2012  . Generalized anxiety disorder 07/14/2012  . Depression 07/14/2012  . Retinal detachment 07/14/2012    Past Surgical History:  Procedure Laterality Date  . ABLATION    . CARDIAC VALVE SURGERY  1995 (approx.  age 50's)   pt does not remember which valve  . KNEE ARTHROSCOPY Right 01/21/2014   Procedure: RIGHT  KNEE ARTHROSCOPY WITH DEBRIDEMENT PARTIAL MENISECTOMY AND AUTOGRAPH ACL RECONSTRUCTION;  Surgeon: Eugenia Mcalpine, MD;  Location: [facility] [place];  Service:  Orthopedics;  Laterality: Right;  . KNEE ARTHROSCOPY N/A 01/2014  . KNEE REPAIR EXTENSOR MECHANISM  2015  . RETINAL DETACHMENT REPAIR W/ SCLERAL BUCKLE LE Left 08-27-2006     OB History   No obstetric history on file.      Home Medications    Prior to Admission medications   Medication Sig Start Date End Date Taking? Authorizing Provider  albuterol (PROVENTIL) (2.5 MG/3ML) 0.083% nebulizer solution Take 3 mLs (2.5 mg total) by nebulization every 6 (six) hours as needed for wheezing or shortness of breath. 03/26/16   Kirichenko, Lemont Fillers, PA-C  amitriptyline (ELAVIL) 25 MG tablet Take 25 mg by mouth as needed for sleep.    [provider]  amLODipine (NORVASC) 10 MG tablet Take 10 mg by mouth daily.    [provider]  amoxicillin-clavulanate (AUGMENTIN) 875-125 MG tablet Take 1 tablet by mouth 2 (two) times daily. 03/18/16   [provider]  azithromycin (ZITHROMAX) 250 MG tablet Take 1 tablet (250 mg total) by mouth daily. Take first 2 tablets together, then 1 every day until finished. 03/26/16   Kirichenko, Tatyana, PA-C  benzonatate (TESSALON) 100 MG capsule Take 100 mg by mouth 3 (three) times daily as needed. 03/18/16   [provider]  clonazePAM (KLONOPIN) 1 MG disintegrating tablet Take 1 mg by mouth as needed for seizure.  [provider]  HYDROcodone-homatropine (HYCODAN) 5-1.5 MG/5ML syrup Take 5 mLs by mouth every 6 (six) hours as needed for cough. 03/26/16   Kirichenko, Tatyana, PA-C  metoprolol succinate (TOPROL-XL) 25 MG 24 hr tablet Take 25 mg by mouth daily.    [provider]  predniSONE (DELTASONE) 50 MG tablet Take 1 tablet (50 mg total) by mouth daily. 03/26/16   Kirichenko, Tatyana, PA-C  sertraline (ZOLOFT) 100 MG tablet Take 100 mg by mouth daily. 12/23/15   [provider]  sertraline (ZOLOFT) 100 MG tablet Take 100 mg by mouth daily.    [provider]  valsartan-hydrochlorothiazide (DIOVAN-HCT)  320-25 MG tablet Take 1 tablet by mouth daily. 03/18/16   [provider]  valsartan-hydrochlorothiazide (DIOVAN-HCT) 320-25 MG tablet Take 1 tablet by mouth daily.    [provider]    Family History Family History  Problem Relation Age of Onset  . Hypertension Mother   . Diabetes Father   . Hypertension Brother     Social History Social History   Tobacco Use  . Smoking status: Former Games developer  . Smokeless tobacco: Never Used  . Tobacco comment: quit 2010  Substance Use Topics  . Alcohol use: Yes    Comment: Glass or two of wine on occasion, past history of heavy use   . Drug use: No    Comment: hx   THC use, ended 10+ years ago-- (01-17-2014   DENIES     Allergies   Patient has no known allergies.   Review of Systems Review of Systems  Constitutional: Positive for fatigue. Negative for chills, diaphoresis and fever.  HENT: Negative for congestion, rhinorrhea and sneezing.   Eyes: Negative.   Respiratory: Positive for shortness of breath. Negative for cough and chest tightness.   Cardiovascular: Positive for chest pain. Negative for leg swelling.  Gastrointestinal: Negative for abdominal pain, blood in stool, diarrhea, nausea and vomiting.  Genitourinary: Negative for difficulty urinating, flank pain, frequency and hematuria.  Musculoskeletal: Negative for arthralgias and back pain.  Skin: Negative for rash.  Neurological: Negative for dizziness, speech difficulty, weakness, numbness and headaches.     Physical Exam Updated Vital Signs BP (!) 159/133   Pulse 88   Temp 98.1 F (36.7 C) (Oral)   Resp 15   LMP 10/05/2018   SpO2 100%   Physical Exam Constitutional:      Appearance: She is well-developed.  HENT:     Head: Normocephalic and atraumatic.  Eyes:     Pupils: Pupils are equal, round, and reactive to light.  Neck:     Musculoskeletal: Normal range of motion and neck supple.  Cardiovascular:     Rate and Rhythm: Normal rate and  regular rhythm.     Heart sounds: Normal heart sounds.  Pulmonary:     Effort: Pulmonary effort is normal. No respiratory distress.     Breath sounds: Normal breath sounds. No wheezing or rales.  Chest:     Chest wall: No tenderness.  Abdominal:     General: Bowel sounds are normal.     Palpations: Abdomen is soft.     Tenderness: There is no abdominal tenderness. There is no guarding or rebound.  Musculoskeletal: Normal range of motion.     Right lower leg: Edema present.     Left lower leg: Edema present.  Lymphadenopathy:     Cervical: No cervical adenopathy.  Skin:    General: Skin is warm and dry.     Findings: No  rash.  Neurological:     Mental Status: She is alert and oriented to person, place, and time.      ED Treatments / Results  Labs (all labs ordered are listed, but only abnormal results are displayed) Labs Reviewed  BASIC METABOLIC PANEL - Abnormal; Notable for the following components:      Result Value   Glucose, Bld 102 (*)    Calcium 8.5 (*)    All other components within normal limits  CBC - Abnormal; Notable for the following components:   Hemoglobin 10.9 (*)    MCH 24.1 (*)    MCHC 29.8 (*)    RDW 17.2 (*)    Platelets 493 (*)    All other components within normal limits  BRAIN NATRIURETIC PEPTIDE - Abnormal; Notable for the following components:   B Natriuretic Peptide 461.7 (*)    All other components within normal limits  TROPONIN I  PREGNANCY, URINE    EKG EKG Interpretation  Date/Time:  Wednesday October 14 2018 10:52:00 EST Ventricular Rate:  100 PR Interval:    QRS Duration: 87 QT Interval:  363 QTC Calculation: 469 R Axis:   79 Text Interpretation:  Sinus tachycardia Left atrial enlargement since last tracing no significant change Confirmed by Rolan Bucco [phone]) on 10/14/2018 12:17:53 PM   Radiology Dg Chest 2 View  Result Date: 10/14/2018 CLINICAL DATA:  Chest pain, wheezing EXAM: CHEST - 2 VIEW COMPARISON:  03/26/2016  FINDINGS: Cardiomegaly with vascular congestion. Mild interstitial prominence within the lungs. No effusions or acute bony abnormality. IMPRESSION: Cardiomegaly, vascular congestion. Mild hazy opacities in the lungs could reflect bronchitis/asthma or early interstitial edema. Electronically Signed   By: Charlett Nose M.D.   On: 10/14/2018 11:37    Procedures Procedures (including critical care time)  Medications Ordered in ED Medications  sodium chloride flush (NS) 0.9 % injection 3 mL (3 mLs Intravenous Not Given 10/14/18 1057)  nitroGLYCERIN (NITROSTAT) SL tablet 0.4 mg (0.4 mg Sublingual Given 10/14/18 1209)  furosemide (LASIX) injection 40 mg (has no administration in time range)  acetaminophen (TYLENOL) tablet 1,000 mg (1,000 mg Oral Given 10/14/18 1157)     Initial Impression / Assessment and Plan / ED Course  I have reviewed the triage vital signs and the nursing notes.  Pertinent labs & imaging results that were available during my care of the patient were reviewed by me and considered in my medical decision making (see chart for details).       Patient is a 48 year old female who presents with chest tightness and shortness of breath.  She has suggestions of fluid overload with vascular congestion on x-ray and an elevated BNP.  Her troponin is negative.  There is no ischemic changes on EKG.  She was given nitroglycerin for her chest tightness and a dose of Lasix.  I spoke with the APP at [facility] cardiology who discussed her case with the physician there and their plan is to admit the patient to [facility].  Once a bed is ready we will transfer her to [facility].   Final Clinical Impressions(s) / ED Diagnoses   Final diagnoses:  Chest pain, unspecified type  Acute congestive heart failure, unspecified heart failure type [facility])    ED Discharge Orders    None       Rolan Bucco, MD 10/14/18 1247

## 2018-10-14 NOTE — ED Triage Notes (Signed)
Patient here with SOB with minimal wheezing in upper lobes. Chest pain for a few days. Is currently under cardiologist care for preliminary testing for tachycardia

## 2018-10-14 NOTE — Plan of Care (Signed)

## 2018-10-14 NOTE — H&P (Addendum)
Sydney Jackson is an 48 y.o. female.   Chief Complaint: Shortness of breath, chest tightness HPI:   48 year old African-American female with hypertension, history of SVT status post ablation, new diagnosis of cardiomyopathy EF 25-30%, grade 2 diastolic dysfunction, grade 2 mitral regurgitation, nonsustained VT seen on event monitor, pending further work-up.  Patient presented to [facility] today with complaints of chest pain and shortness of breath.Patient has been complaining of chest tightness, orthopnea, PND for last several days. She is currently taking amlodipine 10, diovan  hct 320-25 mg, not taking metoprolol.  She has family history of heart failure with her father was disease having had congestive heart failure. There is also family history of hypertension in her mother and brother. Patient denies any long-standing neurological complaints.  She's currently not using any contraceptives, but does not plan on getting pregnant.  Past Medical History:  Diagnosis Date  . Depression   . Herpes simplex disease   . History of syncope    s/p  heart valve repair  1995  . Hypertension   . NSVT (nonsustained ventricular tachycardia) (HCC)   . Palpitations   . Right ACL tear   . Right knee meniscal tear   . S/P heart valve repair    1995--  pt does not remember which one    Past Surgical History:  Procedure Laterality Date  . ABLATION    . CARDIAC VALVE SURGERY  1995 (approx.  age 68's)   pt does not remember which valve  . KNEE ARTHROSCOPY Right 01/21/2014   Procedure: RIGHT  KNEE ARTHROSCOPY WITH DEBRIDEMENT PARTIAL MENISECTOMY AND AUTOGRAPH ACL RECONSTRUCTION;  Surgeon: Eugenia Mcalpine, MD;  Location: [facility];  Service: Orthopedics;  Laterality: Right;  . KNEE ARTHROSCOPY N/A 01/2014  . KNEE REPAIR EXTENSOR MECHANISM  2015  . RETINAL DETACHMENT REPAIR W/ SCLERAL BUCKLE LE Left 08-27-2006    Family History  Problem Relation Age of Onset  .  Hypertension Mother   . Diabetes Father   . Hypertension Brother    Social History:  reports that she has quit smoking. She has never used smokeless tobacco. She reports current alcohol use. She reports that she does not use drugs.  Allergies: No Known Allergies  Review of Systems  Constitution: Negative for decreased appetite, malaise/fatigue, weight gain and weight loss.  HENT: Negative for congestion.   Eyes: Negative for visual disturbance.  Cardiovascular: Positive for chest pain, dyspnea on exertion, orthopnea and paroxysmal nocturnal dyspnea. Negative for leg swelling, palpitations and syncope.  Respiratory: Negative for shortness of breath.   Endocrine: Negative for cold intolerance.  Hematologic/Lymphatic: Does not bruise/bleed easily.  Skin: Negative for itching and rash.  Musculoskeletal: Negative for myalgias.  Gastrointestinal: Negative for abdominal pain, nausea and vomiting.  Genitourinary: Negative for dysuria.  Neurological: Negative for dizziness and weakness.  Psychiatric/Behavioral: The patient is not nervous/anxious.   All other systems reviewed and are negative.    Blood pressure (!) 170/128, pulse 96, temperature 98.4 F (36.9 C), temperature source Oral, resp. rate 20, height  (1.626 m), weight 104 kg, last menstrual period 10/05/2018, SpO2 99 %. Body mass index is 39.34 kg/m.  Physical Exam  Constitutional: She is oriented to person, place, and time. She appears well-developed and well-nourished. No distress.  HENT:  Head: Normocephalic and atraumatic.  Eyes: Pupils are equal, round, and reactive to light. Conjunctivae are normal.  Neck: No JVD present.  Cardiovascular: Normal rate, regular rhythm and intact distal pulses.  Murmur (II/VI apical holosystolic) heard. Pulmonary/Chest: Effort normal and breath sounds normal. She has no wheezes. She has no rales.  Abdominal: Soft. Bowel sounds are normal. There is no rebound.  Musculoskeletal:         General: Edema (Trace) present.  Lymphadenopathy:    She has no cervical adenopathy.  Neurological: She is alert and oriented to person, place, and time. No cranial nerve deficit.  Skin: Skin is warm and dry.  Psychiatric: She has a normal mood and affect.  Nursing note and vitals reviewed.   Results for orders placed or performed during the hospital encounter of 10/14/18 (from the past 48 hour(s))  Basic metabolic panel     Status: Abnormal   Collection Time: 10/14/18 10:58 AM  Result Value Ref Range   Sodium 136 135 - 145 mmol/L   Potassium 3.7 3.5 - 5.1 mmol/L   Chloride 106 98 - 111 mmol/L   CO2 23 22 - 32 mmol/L   Glucose, Bld 102 (H) 70 - 99 mg/dL   BUN 16 6 - 20 mg/dL   Creatinine, Ser 8.09 0.44 - 1.00 mg/dL   Calcium 8.5 (L) 8.9 - 10.3 mg/dL   GFR calc non Af Amer >60 >60 mL/min   GFR calc Af Amer >60 >60 mL/min   Anion gap 7 5 - 15    Comment: Performed at [facility], [address] [place], [place] [address]  CBC     Status: Abnormal   Collection Time: 10/14/18 10:58 AM  Result Value Ref Range   WBC 5.7 4.0 - 10.5 K/uL   RBC 4.52 3.87 - 5.11 MIL/uL   Hemoglobin 10.9 (L) 12.0 - 15.0 g/dL   HCT 25.0 53.9 - 76.7 %   MCV 81.0 80.0 - 100.0 fL   MCH 24.1 (L) 26.0 - 34.0 pg   MCHC 29.8 (L) 30.0 - 36.0 g/dL   RDW 34.1 (H) 93.7 - 90.2 %   Platelets 493 (H) 150 - 400 K/uL   nRBC 0.0 0.0 - 0.2 %    Comment: Performed at [facility], [address] [place], [place] [address]  Troponin I - ONCE - STAT     Status: None   Collection Time: 10/14/18 10:58 AM  Result Value Ref Range   Troponin I <0.03 <0.03 ng/mL    Comment: Performed at [facility], [address] [place], [place] [address]  Brain natriuretic peptide     Status: Abnormal   Collection Time: 10/14/18 10:58 AM  Result Value Ref Range   B Natriuretic Peptide 461.7 (H) 0.0 - 100.0 pg/mL    Comment: Performed at [facility], [address] [place], [place]  [address]    Labs:   Lab Results  Component Value Date   WBC 5.7 10/14/2018   HGB 10.9 (L) 10/14/2018   HCT 36.6 10/14/2018   MCV 81.0 10/14/2018   PLT 493 (H) 10/14/2018    Recent Labs  Lab 10/14/18 1058  NA 136  K 3.7  CL 106  CO2 23  BUN 16  CREATININE 0.84  CALCIUM 8.5*  GLUCOSE 102*    Lipid Panel  No results found for: CHOL, TRIG, HDL, CHOLHDL, VLDL, LDLCALC  BNP (last 3 results) Recent Labs    10/14/18 1058  BNP 461.7*    HEMOGLOBIN A1C No results found for: HGBA1C, MPG  Cardiac Panel (last 3 results) Recent Labs    10/14/18 1058  TROPONINI <0.03    Lab Results  Component Value Date   TROPONINI <0.03 10/14/2018     TSH No results for input(s): TSH in the last 8760 hours.   Medications Prior to Admission  Medication Sig Dispense Refill  . amLODipine (NORVASC) 10 MG tablet Take 10 mg by mouth daily.    . metoprolol succinate (TOPROL-XL) 25 MG 24 hr tablet Take 25 mg by mouth daily.    . sertraline (ZOLOFT) 100 MG tablet Take 100 mg by mouth daily.    Marland Kitchen albuterol (PROVENTIL) (2.5 MG/3ML) 0.083% nebulizer solution Take 3 mLs (2.5 mg total) by nebulization every 6 (six) hours as needed for wheezing or shortness of breath. 75 mL 12  . amitriptyline (ELAVIL) 25 MG tablet Take 25 mg by mouth as needed for sleep.    Marland Kitchen amoxicillin-clavulanate (AUGMENTIN) 875-125 MG tablet Take 1 tablet by mouth 2 (two) times daily.    Marland Kitchen azithromycin (ZITHROMAX) 250 MG tablet Take 1 tablet (250 mg total) by mouth daily. Take first 2 tablets together, then 1 every day until finished. 6 tablet 0  . benzonatate (TESSALON) 100 MG capsule Take 100 mg by mouth 3 (three) times daily as needed.    . clonazePAM (KLONOPIN) 1 MG disintegrating tablet Take 1 mg by mouth as needed for seizure.    Marland Kitchen HYDROcodone-homatropine (HYCODAN) 5-1.5 MG/5ML syrup Take 5 mLs by mouth every 6 (six) hours as needed for cough. 120 mL 0  . predniSONE (DELTASONE) 50 MG tablet Take 1 tablet (50 mg total) by  mouth daily. 5 tablet 0  . sertraline (ZOLOFT) 100 MG tablet Take 100 mg by mouth daily.    . valsartan-hydrochlorothiazide (DIOVAN-HCT) 320-25 MG tablet Take 1 tablet by mouth daily.    . valsartan-hydrochlorothiazide (DIOVAN-HCT) 320-25 MG tablet Take 1 tablet by mouth daily.        Current Facility-Administered Medications:  .  0.9 %  sodium chloride infusion, 250 mL, Intravenous, PRN, Antonin Meininger J, MD .  acetaminophen (TYLENOL) tablet 650 mg, 650 mg, Oral, Q4H PRN, Diona Peregoy J, MD .  heparin injection 5,000 Units, 5,000 Units, Subcutaneous, Q8H, Jarell Mcewen J, MD .  nitroGLYCERIN (NITROSTAT) SL tablet 0.4 mg, 0.4 mg, Sublingual, Q5 min PRN, Rolan Bucco, MD, 0.4 mg at 10/14/18 1209 .  ondansetron (ZOFRAN) injection 4 mg, 4 mg, Intravenous, Q6H PRN, Desman Polak J, MD .  sodium chloride flush (NS) 0.9 % injection 3 mL, 3 mL, Intravenous, Once, Belfi, Melanie, MD .  sodium chloride flush (NS) 0.9 % injection 3 mL, 3 mL, Intravenous, Q12H, Jailen Lung J, MD .  sodium chloride flush (NS) 0.9 % injection 3 mL, 3 mL, Intravenous, PRN, Fatima Fedie J, MD  CARDIAC STUDIES:  EKG 10/14/2018: Sinus tachycardia 100 bpm.  Left atrial enlargmeent. No ischemic changes.   Chest Xray 10/14/2018: Cardiomegaly, vascular congestion. Mild hazy opacities in the lungs could reflect bronchitis/asthma or early interstitial edema.  Echocardiogram 09/01/2018: Left ventricle cavity is normal in size. Moderate asymmetric hypertrophy of the left ventricle, with posterior wall measuring 1.6 cm. Severe decrease in global wall motion. Visual LVEF 25-30%. Grade 2 diastolic dysfunction with elevated left atrial pressure.  Calculated EF 30%. Left atrial cavity is moderately dilated. 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 Moderate (Grade II) mitral regurgitation. Inadequate TR jet to estimate PA systolic pressure. Normal right atrial  pressure.   Assessment/Plan  48 year old [place] American female with uncontrolled hypertension, heart failure with reduced ejection fraction, nonsustained ventricular tachycardia, history of SVT status  post ablation in her 46s, admitted with hypertensive urgency and acute on chronic heart failure.  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 Stop Diovan-HCTZ, start Entresto 49-50 milligrams twice daily. Start spironolactone 50 mg daily.  Will add beta blocker tomorrow. We will up titrate this as tolerated. Continue amlodipine 10 mg for now. Patient received 1 dose of furosemide 40 mg daily at [facility]. Will hold off for the diuretic at this time. She will need ischemic evaluation. She has a previously scheduled outpatient nuclear stress test on 10/19/2018. Low suspicion for amyloidosis at this time. However, family history is significant for heart failure. Will obtain cardiac MRI in 3 months to evaluate for infiltrative cardiomyopathy, as well as EF after guideline directed medical therapy.  Given maternal and fetal risk factors due to uncontrolled hypertension, half rest, as well as use of potentially teratogenic medications, I strongly recommend considering contraception methods to avoid pregnancy. I've encouraged her to talk to her OB/GYN.  Hypertensive urgency: Medications as above. Will need to exclude secondary hypertension. We'll obtain renal artery duplex ultrasound, renin and aldosterone.  Chest tightness:  Likely due to the above. Serial troponin ordered.  Elder Negus, MD 10/14/2018, 5:20 PM [facility] [place] Pager: [phone] Office: [phone] If no answer: Cell:  [phone]

## 2018-10-14 NOTE — ED Notes (Signed)
[facility] arrived to transport pt to [facility] 

## 2018-10-15 ENCOUNTER — Observation Stay (HOSPITAL_COMMUNITY): Payer: BLUE CROSS/BLUE SHIELD

## 2018-10-15 DIAGNOSIS — I160 Hypertensive urgency: Secondary | ICD-10-CM

## 2018-10-15 DIAGNOSIS — R079 Chest pain, unspecified: Secondary | ICD-10-CM | POA: Diagnosis present

## 2018-10-15 DIAGNOSIS — Z8249 Family history of ischemic heart disease and other diseases of the circulatory system: Secondary | ICD-10-CM | POA: Diagnosis not present

## 2018-10-15 DIAGNOSIS — I5023 Acute on chronic systolic (congestive) heart failure: Secondary | ICD-10-CM

## 2018-10-15 DIAGNOSIS — I10 Essential (primary) hypertension: Secondary | ICD-10-CM

## 2018-10-15 DIAGNOSIS — I472 Ventricular tachycardia: Secondary | ICD-10-CM | POA: Diagnosis present

## 2018-10-15 DIAGNOSIS — Z87891 Personal history of nicotine dependence: Secondary | ICD-10-CM | POA: Diagnosis not present

## 2018-10-15 DIAGNOSIS — Z833 Family history of diabetes mellitus: Secondary | ICD-10-CM | POA: Diagnosis not present

## 2018-10-15 DIAGNOSIS — M797 Fibromyalgia: Secondary | ICD-10-CM | POA: Diagnosis present

## 2018-10-15 DIAGNOSIS — I110 Hypertensive heart disease with heart failure: Secondary | ICD-10-CM | POA: Diagnosis present

## 2018-10-15 DIAGNOSIS — I43 Cardiomyopathy in diseases classified elsewhere: Secondary | ICD-10-CM | POA: Diagnosis present

## 2018-10-15 LAB — CBC WITH DIFFERENTIAL/PLATELET
Abs Immature Granulocytes: 0.01 10*3/uL (ref 0.00–0.07)
Basophils Absolute: 0 10*3/uL (ref 0.0–0.1)
Basophils Relative: 1 %
Eosinophils Absolute: 0 10*3/uL (ref 0.0–0.5)
Eosinophils Relative: 1 %
HCT: 37.2 % (ref 36.0–46.0)
Hemoglobin: 11.4 g/dL — ABNORMAL LOW (ref 12.0–15.0)
Immature Granulocytes: 0 %
Lymphocytes Relative: 29 %
Lymphs Abs: 1.4 10*3/uL (ref 0.7–4.0)
MCH: 23.7 pg — ABNORMAL LOW (ref 26.0–34.0)
MCHC: 30.6 g/dL (ref 30.0–36.0)
MCV: 77.3 fL — ABNORMAL LOW (ref 80.0–100.0)
Monocytes Absolute: 0.4 10*3/uL (ref 0.1–1.0)
Monocytes Relative: 8 %
Neutro Abs: 3 10*3/uL (ref 1.7–7.7)
Neutrophils Relative %: 61 %
Platelets: 490 10*3/uL — ABNORMAL HIGH (ref 150–400)
RBC: 4.81 MIL/uL (ref 3.87–5.11)
RDW: 17 % — ABNORMAL HIGH (ref 11.5–15.5)
WBC: 4.8 10*3/uL (ref 4.0–10.5)
nRBC: 0 % (ref 0.0–0.2)

## 2018-10-15 LAB — BASIC METABOLIC PANEL
Anion gap: 8 (ref 5–15)
BUN: 14 mg/dL (ref 6–20)
CO2: 25 mmol/L (ref 22–32)
Calcium: 8.9 mg/dL (ref 8.9–10.3)
Chloride: 103 mmol/L (ref 98–111)
Creatinine, Ser: 0.82 mg/dL (ref 0.44–1.00)
GFR calc Af Amer: >60 mL/min (ref >60)
GFR calc non Af Amer: >60 mL/min (ref >60)
Glucose, Bld: 106 mg/dL — ABNORMAL HIGH (ref 70–99)
Potassium: 3.5 mmol/L (ref 3.5–5.1)
Sodium: 136 mmol/L (ref 135–145)

## 2018-10-15 LAB — PREGNANCY, URINE: Preg Test, Ur: NEGATIVE

## 2018-10-15 LAB — TSH: TSH: 0.861 u[IU]/mL (ref 0.350–4.500)

## 2018-10-15 MED ORDER — CARVEDILOL 12.5 MG PO TABS
12.5000 mg | ORAL_TABLET | Freq: Two times a day (BID) | ORAL | Status: DC
  Administered 2018-10-15 – 2018-10-16 (×3): 12.5 mg via ORAL
  Filled 2018-10-15 (×3): qty 1

## 2018-10-15 MED ORDER — SPIRONOLACTONE 50 MG PO TABS: 50.0000 mg | ORAL_TABLET | Freq: Every day | ORAL | 0 refills | Status: DC

## 2018-10-15 MED ORDER — SACUBITRIL-VALSARTAN 49-51 MG PO TABS: 1.0000 | ORAL_TABLET | Freq: Two times a day (BID) | ORAL | 0 refills | Status: DC

## 2018-10-15 MED ORDER — ACETAMINOPHEN 325 MG PO TABS
650.0000 mg | ORAL_TABLET | Freq: Four times a day (QID) | ORAL | Status: DC | PRN
  Administered 2018-10-15: 650 mg via ORAL
  Filled 2018-10-15: qty 2

## 2018-10-15 MED ORDER — NITROGLYCERIN 0.4 MG SL SUBL: 0.4000 mg | SUBLINGUAL_TABLET | SUBLINGUAL | 0 refills | Status: DC | PRN

## 2018-10-15 MED ORDER — HYDRALAZINE HCL 25 MG PO TABS
25.0000 mg | ORAL_TABLET | Freq: Once | ORAL | Status: AC
  Administered 2018-10-15: 25 mg via ORAL
  Filled 2018-10-15: qty 1

## 2018-10-15 MED ORDER — CARVEDILOL 12.5 MG PO TABS: 12.5000 mg | ORAL_TABLET | Freq: Two times a day (BID) | ORAL | 0 refills | Status: DC

## 2018-10-15 MED ORDER — DIPHENHYDRAMINE HCL 25 MG PO CAPS
25.0000 mg | ORAL_CAPSULE | Freq: Three times a day (TID) | ORAL | Status: DC | PRN
  Administered 2018-10-15: 25 mg via ORAL
  Filled 2018-10-15: qty 1

## 2018-10-15 MED FILL — SPIRONOLACTONE 50 MG TAB: 50 | 30 days supply | Qty: 30 | Fill #0

## 2018-10-15 MED FILL — ENTRESTO 49 MG-51 MG TABLET: 49-51 | 30 days supply | Qty: 60 | Fill #0

## 2018-10-15 MED FILL — CARVEDILOL 12.5 MG TABLET: 12.5 | 30 days supply | Qty: 60 | Fill #0

## 2018-10-15 MED FILL — NITROGLYCERIN 0.4 MG TAB SL: 0.4 | 6 days supply | Qty: 25 | Fill #0

## 2018-10-15 NOTE — Progress Notes (Signed)
Renal artery duplex has been completed. Preliminary results can be found in CV Proc through chart review.   10/15/18 10:55 AM Olen Cordial RVT

## 2018-10-15 NOTE — Progress Notes (Signed)
Pt BP is 160/117. MD notified. Will continue to monitor.

## 2018-10-15 NOTE — Progress Notes (Signed)
Subjective:  Reported itching and nausea this morning.  Objective:  Vital Signs in the last 24 hours: Temp:  [98 F (36.7 C)-98.7 F (37.1 C)] 98.3 F (36.8 C) (03/05 1203) Pulse Rate:  [78-101] 78 (03/05 1203) Resp:  [19-20] 20 (03/05 1203) BP: (130-185)/(85-128) 130/85 (03/05 1203) SpO2:  [96 %-100 %] 96 % (03/05 1203) Weight:  [102.1 kg-104 kg] 102.1 kg (03/05 0549)  Intake/Output from previous day: 03/04 0701 - 03/05 0700 In: 483 [P.O.:480; I.V.:3] Out: 2200 [Urine:2200]  Physical Exam Constitutional: She is oriented to person, place, and time. She appears well-developed and well-nourished. No distress.  HENT:  Head: Normocephalic and atraumatic.  Eyes: Pupils are equal, round, and reactive to light. Conjunctivae are normal.  Neck: No JVD present.  Cardiovascular: Normal rate, regular rhythm and intact distal pulses.  Murmur (II/VI apical holosystolic) heard. Pulmonary/Chest: Effort normal and breath sounds normal. She has no wheezes. She has no rales.  Abdominal: Soft. Bowel sounds are normal. There is no rebound.  Musculoskeletal:        General: No edema present.  Lymphadenopathy:    She has no cervical adenopathy.  Neurological: She is alert and oriented to person, place, and time. No cranial nerve deficit.  Skin: Skin is warm and dry.  Psychiatric: She has a normal mood and affect.  Nursing note and vitals reviewed.  Lab Results: BMP Recent Labs    10/14/18 1058 10/15/18 0614  NA 136 136  K 3.7 3.5  CL 106 103  CO2 23 25  GLUCOSE 102* 106*  BUN 16 14  CREATININE 0.84 0.82  CALCIUM 8.5* 8.9  GFRNONAA >60 >60  GFRAA >60 >60    CBC Recent Labs  Lab 10/15/18 0614  WBC 4.8  RBC 4.81  HGB 11.4*  HCT 37.2  PLT 490*  MCV 77.3*  MCH 23.7*  MCHC 30.6  RDW 17.0*  LYMPHSABS 1.4  MONOABS 0.4  EOSABS 0.0  BASOSABS 0.0    HEMOGLOBIN A1C No results found for: HGBA1C, MPG  Cardiac Panel (last 3 results) Recent Labs    10/14/18 1058  10/14/18 1623  TROPONINI <0.03 <0.03    BNP (last 3 results) Recent Labs    10/14/18 1058  BNP 461.7*   CARDIAC STUDIES:  Renal vascular US 10/15/2018: Renal:   Right: Normal size right kidney. Normal right Resisitive Index.        1-59% stenosis of the right renal artery. Left:  Normal size of left kidney. Normal left Resistive Index.        1-59% stenosis of the left renal artery.     EKG 10/14/2018: Sinus tachycardia 100 bpm.  Left atrial enlargmeent. No ischemic changes.   Chest Xray 10/14/2018: Cardiomegaly, vascular congestion. Mild hazy opacities in the lungs could reflect bronchitis/asthma or early interstitial edema.  Echocardiogram 09/01/2018: Left ventricle cavity is normal in size. Moderate asymmetric hypertrophy of the left ventricle, with posterior wall measuring 1.6 cm. Severe decrease in global wall motion. Visual LVEF 25-30%. Grade 2 diastolic dysfunction with elevated left atrial pressure. Calculated EF 30%. Left atrial cavity is moderately dilated. 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 Moderate (Grade II) mitral regurgitation. Inadequate TR jet to estimate PA systolic pressure. Normal right atrial pressure.    Assessment & Recommendations:  48 year old [place] American female with uncontrolled hypertension, heart failure with reduced ejection fraction, nonsustained ventricular tachycardia, history of SVT status post ablation in her 44s, admitted with hypertensive urgency and acute on chronic heart failure.  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  She has had mild itching and nausea. While this could be due to new medications, there is no severe rash or anaphylaxis.  I will monitor her overnight. Continue Entresto 49-50 milligrams twice daily, spironolactone 50 mg daily, amlodipine 10 mg daily Added carvedilol 12.5 mg twice daily. If hypotension occurs, will back off on amlodipine    She will need ischemic evaluation. She has a previously scheduled outpatient nuclear stress test on 10/19/2018. Low suspicion for amyloidosis at this time. However, family history is significant for heart failure. Will obtain cardiac MRI in 3 months to evaluate for infiltrative cardiomyopathy, as well as EF after guideline directed medical therapy.  Given maternal and fetal risk factors due to uncontrolled hypertension, half rest, as well as use of potentially teratogenic medications, I strongly recommend considering contraception methods to avoid pregnancy. I've encouraged her to talk to her OB/GYN.  Hypertensive urgency: Resolving. BP much better controlled. Renal vascular US without severe RAS. Renin/aldosterone pending. Will also check TSH.  Chest tightness:  Likely due to the above. MI excluded.   Elder Negus, M.D. 10/15/2018, 12:17 PM [facility] Pager: [phone] Office: [phone] If no answer: [phone]

## 2018-10-15 NOTE — Plan of Care (Signed)

## 2018-10-15 NOTE — Discharge Summary (Signed)
Physician Discharge Summary  Patient ID: Sydney Jackson MRN: [id number] DOB/AGE: 05/09/1971 48 y.o.  Admit date: 10/14/2018 Discharge date: 10/15/2018  Primary Discharge Diagnosis: Acute on chronic systolic heart failure Hypertensive urgency Chest pain  Secondary Discharge Diagnosis: Acute on chronic systolic heart failure, resolved Hypertensive urgency Chest pain   Hospital Course:   48 year old African-American female with hypertension, history of SVT status post ablation, new diagnosis of cardiomyopathy EF 25-30%, grade 2 diastolic dysfunction, grade 2 mitral regurgitation, nonsustained VT seen on event monitor, pending further work-up. Patient was admitted with complains of chest pain. MI was excluded. Pain was though to be due to hypertensive urgency. Medications were changed as below.  Valsartan-HCTZ 32-25 was stopped. Entresto 49-51 bid was started.  Coreg 12.5 mg bid was started Spironolactone 50 mg daily was started Amlodipine 10 mg was discontinued due to lightheadedness and no added benefit for her cardiomyopathy.  Given maternal and fetal risk factors due to uncontrolled hypertension, half rest, as well as use of potentially teratogenic medications, I strongly recommend considering contraception methods to avoid pregnancy. I've encouraged her to talk to her OB/GYN.  Patient had mild itching relieved with benadryl, without any rash or anaphylaxis.   Renal artery duplex was obtained. Results pending. 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 Low suspicion for amyloidosis at this time. However, family history is significant for heart failure. Will obtain cardiac MRI in 3 months to evaluate for infiltrative cardiomyopathy, as well as EF after guideline directed medical therapy.  Medications were sent to [facility]. She will need prescriptions send to her local pharmacy at transition  of care visit on 10/26/2018.  Discharge Exam: Blood pressure (!) 133/93, pulse 94, temperature 98.6 F (37 C), temperature source Oral, resp. rate 19, height 5\' 4"  (1.626 m), weight 102.1 kg, last menstrual period 10/05/2018, SpO2 100 %.   Constitutional: She is oriented to person, place, and time. She appears well-developed and well-nourished. No distress.  HENT:  Head: Normocephalic and atraumatic.  Eyes: Pupils are equal, round, and reactive to light. Conjunctivae are normal.  Neck: No JVD present.  Cardiovascular: Normal rate, regular rhythm and intact distal pulses.  Murmur (II/VI apical holosystolic) heard. Pulmonary/Chest: Effort normal and breath sounds normal. She has no wheezes. She has no rales.  Abdominal: Soft. Bowel sounds are normal. There is no rebound.  Musculoskeletal:        General: No edema present.  Lymphadenopathy:    She has no cervical adenopathy.  Neurological: She is alert and oriented to person, place, and time. No cranial nerve deficit.  Skin: Skin is warm and dry.  Psychiatric: She has a normal mood and affect.  Nursing note and vitals reviewed.   Recommendations on discharge:  Low salt diet Medication compliance    CARDIAC STUDIES:  EKG 10/14/2018: Sinus tachycardia 100 bpm.  Left atrial enlargmeent. No ischemic changes.   Chest Xray 10/14/2018: Cardiomegaly, vascular congestion. Mild hazy opacities in the lungs could reflect bronchitis/asthma or early interstitial edema.  Echocardiogram 09/01/2018: Left ventricle cavity is normal in size. Moderate asymmetric hypertrophy of the left ventricle, with posterior wall measuring 1.6 cm. Severe decrease in global wall motion. Visual LVEF 25-30%. Grade 2 diastolic dysfunction with elevated left atrial pressure. Calculated EF 30%. Left atrial cavity is moderately dilated. 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 Moderate (Grade II) mitral regurgitation. Inadequate TR jet to estimate PA  systolic pressure. Normal right atrial pressure.   Labs:  Lab Results  Component Value Date   WBC 4.8 10/15/2018   HGB 11.4 (L) 10/15/2018   HCT 37.2 10/15/2018   MCV 77.3 (L) 10/15/2018   PLT 490 (H) 10/15/2018    Recent Labs  Lab 10/15/18 0614  NA 136  K 3.5  CL 103  CO2 25  BUN 14  CREATININE 0.82  CALCIUM 8.9  GLUCOSE 106*    Lipid Panel  No results found for: CHOL, TRIG, HDL, CHOLHDL, VLDL, LDLCALC  BNP (last 3 results) Recent Labs    10/14/18 1058  BNP 461.7*    HEMOGLOBIN A1C No results found for: HGBA1C, MPG  Cardiac Panel (last 3 results) Recent Labs    10/14/18 1058 10/14/18 1623  TROPONINI <0.03 <0.03    Lab Results  Component Value Date   TROPONINI <0.03 10/14/2018     TSH No results for input(s): TSH in the last 8760 hours.  Radiology: Dg Chest 2 View  Result Date: 10/14/2018 CLINICAL DATA:  Chest pain, wheezing EXAM: CHEST - 2 VIEW COMPARISON:  03/26/2016 FINDINGS: Cardiomegaly with vascular congestion. Mild interstitial prominence within the lungs. No effusions or acute bony abnormality. IMPRESSION: Cardiomegaly, vascular congestion. Mild hazy opacities in the lungs could reflect bronchitis/asthma or early interstitial edema. Electronically Signed   By: Charlett Nose M.D.   On: 10/14/2018 11:37      FOLLOW UP PLANS AND APPOINTMENTS  Allergies as of 10/16/2018   No Known Allergies     Medication List    STOP taking these medications   amLODipine 10 MG tablet Commonly known as:  NORVASC   ibuprofen 600 MG tablet Commonly known as:  ADVIL,MOTRIN   valsartan-hydrochlorothiazide 320-25 MG tablet Commonly known as:  DIOVAN-HCT     TAKE these medications   albuterol (2.5 MG/3ML) 0.083% nebulizer solution Commonly known as:  PROVENTIL Take 3 mLs (2.5 mg total) by nebulization every 6 (six) hours as needed for wheezing or shortness of breath.   carvedilol 12.5 MG tablet Commonly known as:  COREG Take 1 tablet (12.5 mg total)  by mouth 2 (two) times daily with a meal.   nitroGLYCERIN 0.4 MG SL tablet Commonly known as:  NITROSTAT Place 1 tablet (0.4 mg total) under the tongue every 5 (five) minutes as needed for chest pain (CP or SOB).   sacubitril-valsartan 49-51 MG Commonly known as:  ENTRESTO Take 1 tablet by mouth 2 (two) times daily.   sertraline 100 MG tablet Commonly known as:  ZOLOFT Take 100 mg by mouth daily.   spironolactone 50 MG tablet Commonly known as:  ALDACTONE Take 1 tablet (50 mg total) by mouth daily.         Truett Mainland MD, [facility] Office: [phone] If no answer: [phone]

## 2018-10-16 LAB — CBC WITH DIFFERENTIAL/PLATELET
Abs Immature Granulocytes: 0.01 10*3/uL (ref 0.00–0.07)
Basophils Absolute: 0 10*3/uL (ref 0.0–0.1)
Basophils Relative: 1 %
Eosinophils Absolute: 0 10*3/uL (ref 0.0–0.5)
Eosinophils Relative: 1 %
HCT: 37.9 % (ref 36.0–46.0)
Hemoglobin: 11.9 g/dL — ABNORMAL LOW (ref 12.0–15.0)
Immature Granulocytes: 0 %
Lymphocytes Relative: 34 %
Lymphs Abs: 1.8 10*3/uL (ref 0.7–4.0)
MCH: 24.6 pg — ABNORMAL LOW (ref 26.0–34.0)
MCHC: 31.4 g/dL (ref 30.0–36.0)
MCV: 78.3 fL — ABNORMAL LOW (ref 80.0–100.0)
Monocytes Absolute: 0.4 10*3/uL (ref 0.1–1.0)
Monocytes Relative: 7 %
Neutro Abs: 3.1 10*3/uL (ref 1.7–7.7)
Neutrophils Relative %: 57 %
Platelets: 493 10*3/uL — ABNORMAL HIGH (ref 150–400)
RBC: 4.84 MIL/uL (ref 3.87–5.11)
RDW: 17.5 % — ABNORMAL HIGH (ref 11.5–15.5)
WBC: 5.4 10*3/uL (ref 4.0–10.5)
nRBC: 0 % (ref 0.0–0.2)

## 2018-10-16 LAB — BASIC METABOLIC PANEL
Anion gap: 8 (ref 5–15)
BUN: 16 mg/dL (ref 6–20)
CO2: 24 mmol/L (ref 22–32)
Calcium: 8.7 mg/dL — ABNORMAL LOW (ref 8.9–10.3)
Chloride: 105 mmol/L (ref 98–111)
Creatinine, Ser: 0.95 mg/dL (ref 0.44–1.00)
GFR calc Af Amer: >60 mL/min (ref >60)
GFR calc non Af Amer: >60 mL/min (ref >60)
Glucose, Bld: 94 mg/dL (ref 70–99)
Potassium: 3.6 mmol/L (ref 3.5–5.1)
Sodium: 137 mmol/L (ref 135–145)

## 2018-10-16 NOTE — Progress Notes (Signed)
Discussed with the patient and all questioned fully answered. She will call me if any problems arise.  IV removed. Telemetry removed, CCMD notified.  Yuliya Nova S [place], RN  

## 2018-10-17 ENCOUNTER — Emergency Department (HOSPITAL_COMMUNITY)
Admission: EM | Admit: 2018-10-17 | Discharge: 2018-10-17 | Disposition: A | Discharge status: Home / Self Care | Payer: BLUE CROSS/BLUE SHIELD | Attending: Emergency Medicine | Admitting: Emergency Medicine

## 2018-10-17 ENCOUNTER — Encounter (HOSPITAL_COMMUNITY): Payer: Self-pay

## 2018-10-17 ENCOUNTER — Other Ambulatory Visit: Payer: Self-pay

## 2018-10-17 DIAGNOSIS — I10 Essential (primary) hypertension: Secondary | ICD-10-CM | POA: Diagnosis not present

## 2018-10-17 DIAGNOSIS — Z79899 Other long term (current) drug therapy: Secondary | ICD-10-CM | POA: Insufficient documentation

## 2018-10-17 DIAGNOSIS — Z87891 Personal history of nicotine dependence: Secondary | ICD-10-CM | POA: Insufficient documentation

## 2018-10-17 DIAGNOSIS — R0789 Other chest pain: Secondary | ICD-10-CM | POA: Diagnosis not present

## 2018-10-17 DIAGNOSIS — R079 Chest pain, unspecified: Secondary | ICD-10-CM | POA: Insufficient documentation

## 2018-10-17 LAB — CBC
HCT: 37.9 % (ref 36.0–46.0)
Hemoglobin: 11.6 g/dL — ABNORMAL LOW (ref 12.0–15.0)
MCH: 24.4 pg — ABNORMAL LOW (ref 26.0–34.0)
MCHC: 30.6 g/dL (ref 30.0–36.0)
MCV: 79.8 fL — ABNORMAL LOW (ref 80.0–100.0)
Platelets: 539 10*3/uL — ABNORMAL HIGH (ref 150–400)
RBC: 4.75 MIL/uL (ref 3.87–5.11)
RDW: 17.3 % — ABNORMAL HIGH (ref 11.5–15.5)
WBC: 5.9 10*3/uL (ref 4.0–10.5)
nRBC: 0 % (ref 0.0–0.2)

## 2018-10-17 LAB — I-STAT TROPONIN, ED
Troponin i, poc: 0.02 ng/mL (ref 0.00–0.08)
Troponin i, poc: 0.02 ng/mL (ref 0.00–0.08)

## 2018-10-17 LAB — BASIC METABOLIC PANEL
Anion gap: 7 (ref 5–15)
BUN: 17 mg/dL (ref 6–20)
CO2: 24 mmol/L (ref 22–32)
Calcium: 8.8 mg/dL — ABNORMAL LOW (ref 8.9–10.3)
Chloride: 104 mmol/L (ref 98–111)
Creatinine, Ser: 0.98 mg/dL (ref 0.44–1.00)
GFR calc Af Amer: >60 mL/min (ref >60)
GFR calc non Af Amer: >60 mL/min (ref >60)
Glucose, Bld: 97 mg/dL (ref 70–99)
Potassium: 3.9 mmol/L (ref 3.5–5.1)
Sodium: 135 mmol/L (ref 135–145)

## 2018-10-17 LAB — I-STAT BETA HCG BLOOD, ED (MC, WL, AP ONLY): I-stat hCG, quantitative: <5 m[IU]/mL (ref <5)

## 2018-10-17 MED ORDER — SODIUM CHLORIDE 0.9% FLUSH: 3.0000 mL | Freq: Once | INTRAVENOUS | Status: DC

## 2018-10-17 NOTE — ED Provider Notes (Signed)
[facility] EMERGENCY DEPARTMENT Provider Note   CSN: [id number] Arrival date & time: 10/17/18  1411    History   Chief Complaint Chief Complaint  Patient presents with  . Chest Pain    HPI Sydney Jackson is a 48 y.o. female.     Patient presents for intermittent sharp chest pain lasting approximately 10 minutes at a time she has had 3 episodes since yesterday evening.  Patient recently discharged after 2 days for work-up of chest pain and heart failure.  Patient currently has no symptoms.  Patient denies any pleuritic component, no shortness of breath, no blood clot history, no leg swelling.  Patient has follow-up on Monday.  Patient had an episode of this chest pain while in the hospital.  No specific exertional component.  No specific timing or associations.  Patient has history of heart valve repair 1995.     Past Medical History:  Diagnosis Date  . Depression   . Herpes simplex disease   . History of syncope    s/p  heart valve repair  1995  . Hypertension   . NSVT (nonsustained ventricular tachycardia) (HCC)   . Palpitations   . Right ACL tear   . Right knee meniscal tear   . S/P heart valve repair    1995--  pt does not remember which one    Patient Active Problem List   Diagnosis Date Noted  . HFrEF (heart failure with reduced ejection fraction) (HCC) 10/14/2018  . S/P ACL reconstruction 01/21/2014  . Hypertension 07/14/2012  . Generalized anxiety disorder 07/14/2012  . Depression 07/14/2012  . Retinal detachment 07/14/2012    Past Surgical History:  Procedure Laterality Date  . ABLATION    . CARDIAC VALVE SURGERY  1995 (approx.  age 58's)   pt does not remember which valve  . KNEE ARTHROSCOPY Right 01/21/2014   Procedure: RIGHT  KNEE ARTHROSCOPY WITH DEBRIDEMENT PARTIAL MENISECTOMY AND AUTOGRAPH ACL RECONSTRUCTION;  Surgeon: Eugenia Mcalpine, MD;  Location: [facility];  Service: Orthopedics;  Laterality: Right;  . KNEE  ARTHROSCOPY N/A 01/2014  . KNEE REPAIR EXTENSOR MECHANISM  2015  . RETINAL DETACHMENT REPAIR W/ SCLERAL BUCKLE LE Left 08-27-2006     OB History   No obstetric history on file.      Home Medications    Prior to Admission medications   Medication Sig Start Date End Date Taking? Authorizing Provider  albuterol (PROVENTIL) (2.5 MG/3ML) 0.083% nebulizer solution Take 3 mLs (2.5 mg total) by nebulization every 6 (six) hours as needed for wheezing or shortness of breath. Patient not taking: Reported on 10/14/2018 03/26/16   Jaynie Crumble, PA-C  carvedilol (COREG) 12.5 MG tablet Take 1 tablet (12.5 mg total) by mouth 2 (two) times daily with a meal. 10/15/18   Patwardhan, Manish J, MD  nitroGLYCERIN (NITROSTAT) 0.4 MG SL tablet Place 1 tablet (0.4 mg total) under the tongue every 5 (five) minutes as needed for chest pain (CP or SOB). 10/15/18   Patwardhan, Manish J, MD  sacubitril-valsartan (ENTRESTO) 49-51 MG Take 1 tablet by mouth 2 (two) times daily. 10/15/18   Patwardhan, Anabel Bene, MD  sertraline (ZOLOFT) 100 MG tablet Take 100 mg by mouth daily.    [provider]  spironolactone (ALDACTONE) 50 MG tablet Take 1 tablet (50 mg total) by mouth daily. 10/15/18   Patwardhan, Anabel Bene, MD    Family History Family History  Problem Relation Age of Onset  . Hypertension Mother   .  Diabetes Father   . Hypertension Brother   . Congestive Heart Failure Sister     Social History Social History   Tobacco Use  . Smoking status: Former Games developer  . Smokeless tobacco: Never Used  . Tobacco comment: quit 2010  Substance Use Topics  . Alcohol use: Yes    Comment: Glass or two of wine on occasion, past history of heavy use   . Drug use: No    Comment: hx   THC use, ended 10+ years ago-- (01-17-2014   DENIES     Allergies   Patient has no known allergies.   Review of Systems Review of Systems  Constitutional: Negative for chills and fever.  HENT: Negative for congestion.   Eyes:  Negative for visual disturbance.  Respiratory: Negative for shortness of breath.   Cardiovascular: Positive for chest pain.  Gastrointestinal: Negative for abdominal pain and vomiting.  Genitourinary: Negative for dysuria and flank pain.  Musculoskeletal: Negative for back pain, neck pain and neck stiffness.  Skin: Negative for rash.  Neurological: Negative for light-headedness and headaches.     Physical Exam Updated Vital Signs BP 105/72   Pulse 81   Temp 97.6 F (36.4 C) (Oral)   Resp 19   Ht 5\' 5"  (1.651 m)   Wt 101.2 kg   LMP 10/05/2018   SpO2 100%   BMI 37.11 kg/m   Physical Exam Vitals signs and nursing note reviewed.  Constitutional:      Appearance: She is well-developed.  HENT:     Head: Normocephalic and atraumatic.  Eyes:     General:        Right eye: No discharge.        Left eye: No discharge.     Conjunctiva/sclera: Conjunctivae normal.  Neck:     Musculoskeletal: Normal range of motion and neck supple.     Trachea: No tracheal deviation.  Cardiovascular:     Rate and Rhythm: Normal rate and regular rhythm.  Pulmonary:     Effort: Pulmonary effort is normal. No tachypnea.     Breath sounds: Normal breath sounds. No decreased breath sounds.  Abdominal:     General: There is no distension.     Palpations: Abdomen is soft.     Tenderness: There is no abdominal tenderness. There is no guarding.  Musculoskeletal:     Right lower leg: She exhibits no tenderness. No edema.     Left lower leg: She exhibits no tenderness. No edema.  Skin:    General: Skin is warm.     Findings: No rash.  Neurological:     Mental Status: She is alert and oriented to person, place, and time.      ED Treatments / Results  Labs (all labs ordered are listed, but only abnormal results are displayed) Labs Reviewed  BASIC METABOLIC PANEL - Abnormal; Notable for the following components:      Result Value   Calcium 8.8 (*)    All other components within normal limits    CBC - Abnormal; Notable for the following components:   Hemoglobin 11.6 (*)    MCV 79.8 (*)    MCH 24.4 (*)    RDW 17.3 (*)    Platelets 539 (*)    All other components within normal limits  I-STAT TROPONIN, ED  I-STAT BETA HCG BLOOD, ED ([facility], [facility], [facility] ONLY)  I-STAT TROPONIN, ED    EKG EKG Interpretation  Date/Time:  Saturday October 17 2018 14:16:02 EST Ventricular  Rate:  85 PR Interval:  152 QRS Duration: 84 QT Interval:  388 QTC Calculation: 461 R Axis:   52 Text Interpretation:  Normal sinus rhythm Biatrial enlargement Nonspecific ST abnormality Abnormal ECG similar Reconfirmed by Blane Ohara [phone]) on 10/17/2018 4:34:51 PM   Radiology No results found.  Procedures Procedures (including critical care time)  Medications Ordered in ED Medications  sodium chloride flush (NS) 0.9 % injection 3 mL (has no administration in time range)     Initial Impression / Assessment and Plan / ED Course  I have reviewed the triage vital signs and the nursing notes.  Pertinent labs & imaging results that were available during my care of the patient were reviewed by me and considered in my medical decision making (see chart for details).       Patient presents with intermittent atypical sharp chest pain.  Currently no symptoms.  Patient cardiac evaluation and 2 troponins are negative.  EKG no ischemic findings.  Patient is follow-up with cardiology on Monday.  I do feel patient stable for this follow-up and reasons to return given.  Vitals reassuring.  Blood work troponin negative, mild anemia, normal kidney function.  Results and differential diagnosis were discussed with the patient/parent/guardian. Xrays were independently reviewed by myself.  Close follow up outpatient was discussed, comfortable with the plan.   Medications  sodium chloride flush (NS) 0.9 % injection 3 mL (has no administration in time range)    Vitals:   10/17/18 1415 10/17/18 1550 10/17/18 1600 10/17/18  1630  BP: 125/88  105/76 105/72  Pulse: 82  87 81  Resp: 20  19 19   Temp: 97.6 F (36.4 C)     TempSrc: Oral     SpO2: 100%  100% 100%  Weight:  101.2 kg    Height:  5\' 5"  (1.651 m)      Final diagnoses:  Acute chest pain     Final Clinical Impressions(s) / ED Diagnoses   Final diagnoses:  Acute chest pain    ED Discharge Orders    None       Blane Ohara, MD 10/17/18 1740

## 2018-10-17 NOTE — ED Triage Notes (Signed)
Pt states she was released recently for CHF. Pt states she began having stabbing chest pains in her chest yesterday. Skin warm and dry no distress noted.

## 2018-10-17 NOTE — ED Notes (Signed)
Pt reports she was recently admitted for chest pain, discharged yesterday. Pt reports prior to discharge she started having central CP and dizziness. Pt reports the pain is intermittent and has worsened since yesterday. She denies N/V, SHOB, or diaphoresis, reports dizziness.

## 2018-10-17 NOTE — Discharge Instructions (Addendum)
Follow-up as previously arranged on Monday.  For persistent chest pain, sweating, if you pass out, persistent shortness of breath or other new concerning symptoms please return to the emergency room.

## 2018-10-19 ENCOUNTER — Ambulatory Visit: Payer: BLUE CROSS/BLUE SHIELD

## 2018-10-19 ENCOUNTER — Telehealth: Payer: Self-pay

## 2018-10-19 DIAGNOSIS — I4729 Other ventricular tachycardia: Secondary | ICD-10-CM

## 2018-10-19 DIAGNOSIS — I472 Ventricular tachycardia: Secondary | ICD-10-CM

## 2018-10-19 NOTE — Telephone Encounter (Signed)
LVM for pt to contact office to finish her TOC

## 2018-10-20 LAB — ALDOSTERONE + RENIN ACTIVITY W/ RATIO
ALDO / PRA Ratio: 4.9 (ref 0.0–30.0)
Aldosterone: 1.3 ng/dL (ref 0.0–30.0)
PRA LC/MS/MS: 0.267 ng/mL/hr (ref 0.167–5.380)

## 2018-10-20 NOTE — Telephone Encounter (Signed)
Location of hospitalization: [place] Reason for hospitalization: Pt was having shortness of breath Date of discharge: 10/16/2018 Date of first communication with patient: today Person contacting patient: Me Current symptoms: None, Pt stated she is trying to "get her energy up." Said she is very fatigued. Pt stated she was experiencing sharp pains, and went back to ER on Saturday, no signs of stroke. Do you understand why you were in the Hospital: Yes Questions regarding discharge instructions: None  Where were you discharged to: Home  Medications reviewed: Yes Allergies reviewed: Yes Dietary changes reviewed: Yes. Discussed low fat and low salt diet.   Referals reviewed: NA Activities of Daily Living: Pt is feeling fatigued. Any transportation issues/concerns: None Any patient concerns: None Confirmed importance & date/time of Follow up appt: Yes Confirmed with patient if condition begins to worsen call. Pt was given the office number and encouraged to call back with questions or concerns: Yes

## 2018-10-20 NOTE — Telephone Encounter (Signed)
-----   Message from [facility], MD sent at 10/15/2018  1:00 PM EST ----- Regarding: RE: TOC Correction: She will be discharged on 03/06.  Thanks MJP  ----- Message ----- From: Hulen Luster Sent: 10/15/2018   9:39 AM EST To: Elder Negus, MD, # Subject: RE: TOC                                        Both follow up and Stress tests appointments are confirmed for:  Stress test 10/19/18  Follow up 10/26/18   I will resend this message to [facility] clinical as Administration cannot do TOC.  Thank you. ----- Message ----- From: Elder Negus, MD Sent: 10/15/2018   9:05 AM EST To: Toniann Fail, NP, # Subject: [facility]                                            Discharge follow up: TOC: Needed Follow up appt: 10/26/2018 with [facility] (Stress test on 10/19/2018) Discharge diagnosis: Hypertensive urgency Discharge date: 10/15/2018.  Thanks MJP

## 2018-10-20 NOTE — Telephone Encounter (Signed)
Pt is feeling concerned about her fatigue. Wants to know if she is supposed to be at home resting for a couple days.  Please Advise.   KC

## 2018-10-20 NOTE — Telephone Encounter (Signed)
That is okay with me. I reviewed her stress test. No evidence of significant blockages in heart arteries, which is good. Will need to continue working on medications to control her blood pressure and get her heart stronger.  Thanks MJP

## 2018-10-21 ENCOUNTER — Telehealth: Payer: Self-pay

## 2018-10-21 NOTE — Telephone Encounter (Signed)
Pt called wanting to know if its okay for her to take ibuprofen 600mg  for her HA;s and theraflu also she thinks she has the flu

## 2018-10-21 NOTE — Telephone Encounter (Signed)
I would recommend tylenol, instead of ibuprofen. Please ask her to contact her primary care doctor for prescription for flu. She has an appt next week. I would recommend rescheduling the appt if she is sick.  Thanks MJP

## 2018-10-23 ENCOUNTER — Emergency Department (HOSPITAL_COMMUNITY)
Admission: EM | Admit: 2018-10-23 | Discharge: 2018-10-24 | Disposition: A | Discharge status: Home / Self Care | Payer: BLUE CROSS/BLUE SHIELD | Attending: Emergency Medicine | Admitting: Emergency Medicine

## 2018-10-23 ENCOUNTER — Encounter (HOSPITAL_COMMUNITY): Payer: Self-pay | Admitting: Emergency Medicine

## 2018-10-23 ENCOUNTER — Other Ambulatory Visit: Payer: Self-pay

## 2018-10-23 DIAGNOSIS — R059 Cough, unspecified: Secondary | ICD-10-CM

## 2018-10-23 DIAGNOSIS — Z87891 Personal history of nicotine dependence: Secondary | ICD-10-CM | POA: Diagnosis not present

## 2018-10-23 DIAGNOSIS — I110 Hypertensive heart disease with heart failure: Secondary | ICD-10-CM | POA: Diagnosis not present

## 2018-10-23 DIAGNOSIS — Z79899 Other long term (current) drug therapy: Secondary | ICD-10-CM | POA: Insufficient documentation

## 2018-10-23 DIAGNOSIS — I501 Left ventricular failure: Secondary | ICD-10-CM | POA: Diagnosis not present

## 2018-10-23 DIAGNOSIS — R05 Cough: Secondary | ICD-10-CM

## 2018-10-23 DIAGNOSIS — I10 Essential (primary) hypertension: Secondary | ICD-10-CM | POA: Diagnosis not present

## 2018-10-23 DIAGNOSIS — Z952 Presence of prosthetic heart valve: Secondary | ICD-10-CM | POA: Insufficient documentation

## 2018-10-23 DIAGNOSIS — R509 Fever, unspecified: Secondary | ICD-10-CM | POA: Diagnosis not present

## 2018-10-23 DIAGNOSIS — J101 Influenza due to other identified influenza virus with other respiratory manifestations: Secondary | ICD-10-CM | POA: Diagnosis not present

## 2018-10-23 NOTE — ED Triage Notes (Signed)
C/o non-productive cough, fever, generalized body aches, and sore throat (when coughing) since Tuesday.

## 2018-10-23 NOTE — Progress Notes (Deleted)
Subjective:   Sydney Jackson, female    DOB: 10-31-1970, 48 y.o.   MRN: [id number]  Harlan Stains, MD:  No chief complaint on file.   HPI: Sydney Jackson  is a 48 y.o. female  with hypertension, depression, history of SVT status post ablation referred to [place] for palpitations.  Patient reports for the last few months she has had episodes of heart racing associated with feeling anxious, chest discomfort, dyspnea and nausea but can occur at random times such as having a conversation and can also occur while walking. Symptoms have progressively worsened and are now occuring daily. Symptoms are similar to her previos SVT episodes; however, has not had syncope or dizziness like before. SVT ablation was performed in [place] around age 60-30. She has been started on metoprolol both for palpitations and her blood pressure.  No history of diabetes or thyroid disorders. No former tobacco use. She does not exercise, but has tried to be more active with walking around at her job.   Past Medical History:  Diagnosis Date   Depression    Herpes simplex disease    History of syncope    s/p  heart valve repair  1995   Hypertension    NSVT (nonsustained ventricular tachycardia) (HCC)    Palpitations    Right ACL tear    Right knee meniscal tear    S/P heart valve repair    1995--  pt does not remember which one    Past Surgical History:  Procedure Laterality Date   [place] (approx.  age 56's)   pt does not remember which valve   KNEE ARTHROSCOPY Right 01/21/2014   Procedure: RIGHT  KNEE ARTHROSCOPY WITH DEBRIDEMENT PARTIAL MENISECTOMY AND AUTOGRAPH ACL RECONSTRUCTION;  Surgeon: Sydnee Cabal, MD;  Location: [place];  Service: Orthopedics;  Laterality: Right;   KNEE ARTHROSCOPY N/A 01/2014   KNEE REPAIR EXTENSOR MECHANISM  2015   RETINAL DETACHMENT REPAIR W/ SCLERAL BUCKLE LE Left 08-27-2006    Family History    Problem Relation Age of Onset   Hypertension Mother    Diabetes Father    Hypertension Brother    Congestive Heart Failure Sister     Social History   Socioeconomic History   Marital status: Single    Spouse name: Not on file   Number of children: Not on file   Years of education: Not on file   Highest education level: Not on file  Occupational History   Occupation: customer service    Employer: [employer]   Financial resource strain: Not on file   Food insecurity:    Worry: Not on file    Inability: Not on file   Transportation needs:    Medical: Not on file    Non-medical: Not on file  Tobacco Use   Smoking status: Former Smoker   Smokeless tobacco: Never Used   Tobacco comment: quit 2010  Substance and Sexual Activity   Alcohol use: Yes    Comment: Glass or two of wine on occasion, past history of heavy use    Drug use: No    Comment: hx   THC use, ended 10+ years ago-- (01-17-2014   DENIES   Sexual activity: Not Currently    Partners: Male  Lifestyle   Physical activity:    Days per week: Not on file    Minutes per session: Not on  file   Stress: Not on file  Relationships   Social connections:    Talks on phone: Not on file    Gets together: Not on file    Attends religious service: Not on file    Active member of club or organization: Not on file    Attends meetings of clubs or organizations: Not on file    Relationship status: Not on file   Intimate partner violence:    Fear of current or ex partner: Not on file    Emotionally abused: Not on file    Physically abused: Not on file    Forced sexual activity: Not on file  Other Topics Concern   Not on file  Social History Narrative   ** Merged History Encounter **       [name] was born and grew up in [place]. She reports that her childhood was "okay." Her biological father left when she was 28 or 80 years of age. She has 7 half-siblings by her  father, and one half-sibling by her mother. She graduated high school and attended one year of college. She has never been married. She has 4 children, a 26 year old son, a 5 year old son, a 61 year old daughter, and her 2 year old daughter. Her 49 year old and 66 year old live with her now. 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 She denies any legal problems. She affiliates as a Financial trader. She reports that she has one friend who she uses for social support.    No outpatient medications have been marked as taking for the 10/26/18 encounter (Appointment) with Miquel Dunn, NP.     Review of Systems  Constitution: Negative for decreased appetite, malaise/fatigue, weight gain and weight loss.  Eyes: Negative for visual disturbance.  Cardiovascular: Positive for chest pain and dyspnea on exertion (with heart racing). Negative for claudication, leg swelling, orthopnea, palpitations and syncope.       Rapid heart rate (can happen with rest or also with exertion but does not occur every time exertion)  Respiratory: Negative for hemoptysis and wheezing.   Endocrine: Negative for cold intolerance and heat intolerance.  Hematologic/Lymphatic: Does not bruise/bleed easily.  Skin: Negative for nail changes.  Musculoskeletal: Negative for muscle weakness and myalgias.  Gastrointestinal: Negative for abdominal pain, change in bowel habit, nausea and vomiting.  Neurological: Negative for difficulty with concentration, dizziness, focal weakness and headaches.  Psychiatric/Behavioral: Negative for altered mental status and suicidal ideas.  All other systems reviewed and are negative.      Objective:     Last menstrual period 10/05/2018.  Cardiac studies:  ED EKG 10/24/18: Sinus rhythm Probable left atrial enlargement Borderline T wave abnormalities No significant change since last tracing.  Chest X-Ray ED  03/14/20120: 1. Mild hazy left basilar opacity. Atelectasis is favored, although a small infiltrate could be considered in the correct clinical setting. 2. Stable cardiomegaly.   Lexiscan Sestamibi stress test 10/19/2018: 1. Lexiscan stress test was performed. Exercise capacity was not assessed. Resting blood pressure was 170/100 mmHg and peak effect blood pressure was 160/90 mmHg. Stress EKG is non diagnostic for ischemia as it is a pharmacologic stress. In addition, the stress electrocardiogram showed sinus tachycardia, normal stress conduction, no stress arrhythmias and nonspecific ST-T changes.  2. The overall quality of the study is good. There is no evidence of abnormal lung activity. Stress and rest SPECT images demonstrate homogeneous tracer distribution throughout the myocardium. LV cavity is dilated  on both rest and stress images. Gated SPECT imaging reveals global decrease in myocardial thickening and wall motion. The left ventricular ejection fraction was reduced at 29%.  3. High risk study due to reduced LVEF. No evidence of ischemia/ infarction seen.  Vascular US Renal Artery Duplex at [facility] 10/14/2018:  Renal: Right: Normal size right kidney. Normal right Resisitive Index. 1-59% stenosis of the right renal artery. Left:  Normal size of left kidney. Normal left Resistive Index. 1-59% stenosis of the left renal artery.  14 day event monitor 09/01/2018-09/14/2018: NSR. 2 autodetected events. 1 episode of sinus rhythm with PVC. 1 episodes of sinus tachycardia with 12 beats of NSVT on day 5 at 11:15AM. No symptoms. No A fib or SVT.  Echocardiogram 09/01/2018: Left ventricle cavity is normal in size. Moderate asymmetric hypertrophy of the left ventricle, with posterior wall measuring 1.6 cm. Severe decrease in global wall motion. Visual LVEF 25-30%. Grade 2 diastolic dysfunction with elevated left atrial pressure. Calculated EF 30%. Left atrial cavity is moderately dilated.  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 Moderate (Grade II) mitral regurgitation. Inadequate TR jet to estimate PA systolic pressure. Normal right atrial pressure.  EKG 08/17/2018: Sinus rhythm at 95 bpm with borderline left atrial enlargement, normal axis, poor early progression cannot exclude anterior infarct old. No evidence of ischemia.  S/P ablation for SVT performed in [place], [place] in her 20's-30's   Recent Labs:  *** CMP Latest Ref Rng & Units 10/24/2018  Glucose 70 - 99 mg/dL 95  BUN 6 - 20 mg/dL 10  Creatinine 0.44 - 1.00 mg/dL 1.22(H)  Sodium 135 - 145 mmol/L 134(L)  Potassium 3.5 - 5.1 mmol/L 4.4  Chloride 98 - 111 mmol/L 101  CO2 22 - 32 mmol/L 22  Calcium 8.9 - 10.3 mg/dL 8.6(L)  Total Protein 6.5 - 8.1 g/dL 7.2  Total Bilirubin 0.3 - 1.2 mg/dL 0.4  Alkaline Phos 38 - 126 U/L 47  AST 15 - 41 U/L 18  ALT 0 - 44 U/L 16  eGFR Af Amer >31m/min >60    CBC Latest Ref Rng & Units 10/24/2018  WBC 4.0 - 10.5 K/uL 4.3  Hemoglobin 12.0 - 15.0 g/dL 11.6(L)  Hematocrit 36.0 - 46.0 % 39.4  Platelets 150 - 400 K/uL 334   08/03/2018: Cholesterol 140, triglycerides 56, HDL 46, LDL 78.  BNP   10/14/18  10/24/18   BNP 461.7* 25.3   Troponin    10/24/18   TROPIPOC 0.01    Lab Results  Component Value Date   TSH 0.861 10/15/2018      Physical Exam  Constitutional: She appears well-developed and well-nourished. No distress.  Morbidly obese  HENT:  Head: Atraumatic.  Eyes: Conjunctivae are normal.  Neck: Neck supple. No JVD present. No thyromegaly present.  Short neck and difficult to evaluate JVP  Cardiovascular: Normal rate, regular rhythm and intact distal pulses. Exam reveals gallop, S4 and an opening snap (Mitral area).  No murmur heard. Pulses:      Carotid pulses are 2+ on the right side and 2+ on the left side.      Dorsalis pedis pulses are 2+ on the right side and 2+ on the left side.       Posterior tibial pulses are 2+ on the right side and 2+  on the left side.  Femoral and popliteal pulse difficult to feel due to patient's body habitus.   Pulmonary/Chest: Effort normal and breath sounds normal.  Abdominal: Soft. Bowel sounds are normal.  Obese. Pannus present  Musculoskeletal: Normal range of motion.        General: No edema.  Neurological: She is alert.  Skin: Skin is warm and dry.  Psychiatric: She has a normal mood and affect.         Assessment & Recommendations:  There are no diagnoses linked to this encounter.   Patients symptoms are concerning for reoccurence of SVT. I will place her on 48 hour holter monitor for further evaluation. If she has reoccurence of SVT, may need repeat EP evaluation. No changes were made to her medications at this time. Will wait until monitor has been complete.  Has S4 on physical exam that is likely related to her hypertension, will obtain echocardiogram for further evaluation and to exclude structural abnormalities. No clinical evidence of heart failure. Blood pressure is elevated today; however, reports that she has not taken her medications this morning. Will reevaluate at her next office visit.  She may need stress testing at somepoint for further evaluation; however, will obtain echocardiogram and monitor first. Discussed diet modifications to promote weight loss. I will see her back after the test for further recommendations and reevaluation.  *I have discussed this case with Dr. Einar Gip and he personally examined the patient and participated in formulating the plan.Jeri Lager, MSN, APRN, FNP-C [facility], [place] Office: [phone] Fax: [phone]

## 2018-10-24 ENCOUNTER — Emergency Department (HOSPITAL_COMMUNITY): Payer: BLUE CROSS/BLUE SHIELD

## 2018-10-24 ENCOUNTER — Other Ambulatory Visit: Payer: Self-pay

## 2018-10-24 DIAGNOSIS — R05 Cough: Secondary | ICD-10-CM | POA: Diagnosis not present

## 2018-10-24 LAB — COMPREHENSIVE METABOLIC PANEL
ALT: 16 U/L (ref 0–44)
AST: 18 U/L (ref 15–41)
Albumin: 3.6 g/dL (ref 3.5–5.0)
Alkaline Phosphatase: 47 U/L (ref 38–126)
Anion gap: 11 (ref 5–15)
BUN: 10 mg/dL (ref 6–20)
CO2: 22 mmol/L (ref 22–32)
Calcium: 8.6 mg/dL — ABNORMAL LOW (ref 8.9–10.3)
Chloride: 101 mmol/L (ref 98–111)
Creatinine, Ser: 1.22 mg/dL — ABNORMAL HIGH (ref 0.44–1.00)
GFR calc Af Amer: >60 mL/min (ref >60)
GFR calc non Af Amer: 52 mL/min — ABNORMAL LOW (ref >60)
Glucose, Bld: 95 mg/dL (ref 70–99)
Potassium: 4.4 mmol/L (ref 3.5–5.1)
Sodium: 134 mmol/L — ABNORMAL LOW (ref 135–145)
Total Bilirubin: 0.4 mg/dL (ref 0.3–1.2)
Total Protein: 7.2 g/dL (ref 6.5–8.1)

## 2018-10-24 LAB — CBC WITH DIFFERENTIAL/PLATELET
Abs Immature Granulocytes: 0.01 10*3/uL (ref 0.00–0.07)
Basophils Absolute: 0 10*3/uL (ref 0.0–0.1)
Basophils Relative: 1 %
Eosinophils Absolute: 0 10*3/uL (ref 0.0–0.5)
Eosinophils Relative: 1 %
HCT: 39.4 % (ref 36.0–46.0)
Hemoglobin: 11.6 g/dL — ABNORMAL LOW (ref 12.0–15.0)
Immature Granulocytes: 0 %
Lymphocytes Relative: 53 %
Lymphs Abs: 2.3 10*3/uL (ref 0.7–4.0)
MCH: 23.5 pg — ABNORMAL LOW (ref 26.0–34.0)
MCHC: 29.4 g/dL — ABNORMAL LOW (ref 30.0–36.0)
MCV: 79.8 fL — ABNORMAL LOW (ref 80.0–100.0)
Monocytes Absolute: 0.6 10*3/uL (ref 0.1–1.0)
Monocytes Relative: 13 %
Neutro Abs: 1.4 10*3/uL — ABNORMAL LOW (ref 1.7–7.7)
Neutrophils Relative %: 32 %
Platelets: 334 10*3/uL (ref 150–400)
RBC: 4.94 MIL/uL (ref 3.87–5.11)
RDW: 17.1 % — ABNORMAL HIGH (ref 11.5–15.5)
WBC: 4.3 10*3/uL (ref 4.0–10.5)
nRBC: 0 % (ref 0.0–0.2)

## 2018-10-24 LAB — BRAIN NATRIURETIC PEPTIDE: B Natriuretic Peptide: 25.3 pg/mL (ref 0.0–100.0)

## 2018-10-24 LAB — I-STAT TROPONIN, ED: Troponin i, poc: 0.01 ng/mL (ref 0.00–0.08)

## 2018-10-24 LAB — INFLUENZA PANEL BY PCR (TYPE A & B)
Influenza A By PCR: POSITIVE — AB
Influenza B By PCR: NEGATIVE

## 2018-10-24 MED ORDER — OSELTAMIVIR PHOSPHATE 75 MG PO CAPS: 75.0000 mg | ORAL_CAPSULE | Freq: Two times a day (BID) | ORAL | 0 refills | Status: AC

## 2018-10-24 MED ORDER — IPRATROPIUM-ALBUTEROL 0.5-2.5 (3) MG/3ML IN SOLN: 3.0000 mL | Freq: Once | RESPIRATORY_TRACT | Status: DC

## 2018-10-24 MED ORDER — OSELTAMIVIR PHOSPHATE 75 MG PO CAPS
75.0000 mg | ORAL_CAPSULE | Freq: Once | ORAL | Status: AC
  Administered 2018-10-24: 75 mg via ORAL
  Filled 2018-10-24: qty 1

## 2018-10-24 MED ORDER — ALBUTEROL SULFATE HFA 108 (90 BASE) MCG/ACT IN AERS
2.0000 | INHALATION_SPRAY | Freq: Once | RESPIRATORY_TRACT | Status: AC
  Administered 2018-10-24: 2 via RESPIRATORY_TRACT
  Filled 2018-10-24: qty 6.7

## 2018-10-24 NOTE — Discharge Instructions (Signed)
You may take over-the-counter medicine for symptomatic relief, such as Tylenol, Motrin, Coricidin HBP , or black elderberry, etc. Please limit acetaminophen (Tylenol) to 4000 mg and Ibuprofen (Motrin, Advil, etc.) to 2400 mg for a 24hr period. Please note that other over-the-counter medicine may contain acetaminophen or ibuprofen as a component of their ingredients.

## 2018-10-24 NOTE — ED Provider Notes (Signed)
[facility] EMERGENCY DEPARTMENT Provider Note  CSN: [id number] Arrival date & time: 10/23/18 2328  Chief Complaint(s) Cough and Fever  HPI Sydney Jackson is a 48 y.o. female   The history is provided by the patient.  Cough  Cough characteristics:  Harsh and non-productive Severity:  Moderate Onset quality:  Gradual Duration:  3 days Timing:  Intermittent Progression:  Waxing and waning Chronicity:  New Smoker: no   Context comment:  Recently admitted for new HF Relieved by:  Nothing Worsened by:  Nothing Associated symptoms: chills, fever (101), myalgias, rhinorrhea and wheezing   Fever  Associated symptoms: chills, cough, myalgias and rhinorrhea    Reports that she was seen by her PCP earlier today and tested negative for influenza with a rapid flu test.  Sent here for further work-up.   Past Medical History Past Medical History:  Diagnosis Date  . Depression   . Herpes simplex disease   . History of syncope    s/p  heart valve repair  1995  . Hypertension   . NSVT (nonsustained ventricular tachycardia) (HCC)   . Palpitations   . Right ACL tear   . Right knee meniscal tear   . S/P heart valve repair    1995--  pt does not remember which one   Patient Active Problem List   Diagnosis Date Noted  . HFrEF (heart failure with reduced ejection fraction) (HCC) 10/14/2018  . S/P ACL reconstruction 01/21/2014  . Hypertension 07/14/2012  . Generalized anxiety disorder 07/14/2012  . Depression 07/14/2012  . Retinal detachment 07/14/2012   Home Medication(s) Prior to Admission medications   Medication Sig Start Date End Date Taking? Authorizing Provider  albuterol (PROVENTIL) (2.5 MG/3ML) 0.083% nebulizer solution Take 3 mLs (2.5 mg total) by nebulization every 6 (six) hours as needed for wheezing or shortness of breath. Patient not taking: Reported on 10/14/2018 03/26/16   Jaynie Crumble, PA-C  carvedilol (COREG) 12.5 MG tablet Take 1 tablet  (12.5 mg total) by mouth 2 (two) times daily with a meal. 10/15/18   Patwardhan, Manish J, MD  nitroGLYCERIN (NITROSTAT) 0.4 MG SL tablet Place 1 tablet (0.4 mg total) under the tongue every 5 (five) minutes as needed for chest pain (CP or SOB). 10/15/18   Patwardhan, Anabel Bene, MD  oseltamivir (TAMIFLU) 75 MG capsule Take 1 capsule (75 mg total) by mouth every 12 (twelve) hours for 5 days. 10/24/18 10/29/18  Evalene Vath, Amadeo Garnet, MD  sacubitril-valsartan (ENTRESTO) 49-51 MG Take 1 tablet by mouth 2 (two) times daily. 10/15/18   Patwardhan, Anabel Bene, MD  sertraline (ZOLOFT) 100 MG tablet Take 100 mg by mouth daily.    [provider]  spironolactone (ALDACTONE) 50 MG tablet Take 1 tablet (50 mg total) by mouth daily. 10/15/18   Patwardhan, Anabel Bene, MD  Past Surgical History Past Surgical History:  Procedure Laterality Date  . ABLATION    . CARDIAC VALVE SURGERY  1995 (approx.  age 16's)   pt does not remember which valve  . KNEE ARTHROSCOPY Right 01/21/2014   Procedure: RIGHT  KNEE ARTHROSCOPY WITH DEBRIDEMENT PARTIAL MENISECTOMY AND AUTOGRAPH ACL RECONSTRUCTION;  Surgeon: Eugenia Mcalpine, MD;  Location: [facility] [place];  Service: Orthopedics;  Laterality: Right;  . KNEE ARTHROSCOPY N/A 01/2014  . KNEE REPAIR EXTENSOR MECHANISM  2015  . RETINAL DETACHMENT REPAIR W/ SCLERAL BUCKLE LE Left 08-27-2006   Family History Family History  Problem Relation Age of Onset  . Hypertension Mother   . Diabetes Father   . Hypertension Brother   . Congestive Heart Failure Sister     Social History Social History   Tobacco Use  . Smoking status: Former Games developer  . Smokeless tobacco: Never Used  . Tobacco comment: quit 2010  Substance Use Topics  . Alcohol use: Yes    Comment: Glass or two of wine on occasion, past history of heavy use   . Drug use: No     Comment: hx   THC use, ended 10+ years ago-- (01-17-2014   DENIES   Allergies Patient has no known allergies.  Review of Systems Review of Systems  Constitutional: Positive for chills and fever (101).  HENT: Positive for rhinorrhea.   Respiratory: Positive for cough and wheezing.   Musculoskeletal: Positive for myalgias.   All other systems are reviewed and are negative for acute change except as noted in the HPI  Physical Exam Vital Signs  I have reviewed the triage vital signs BP 126/87   Pulse 86   Temp 98.4 F (36.9 C) (Oral)   Resp 18   Ht  (1.651 m)   Wt 98.4 kg   LMP 10/05/2018   SpO2 100%   BMI 36.11 kg/m   Physical Exam Vitals signs reviewed.  Constitutional:      General: She is not in acute distress.    Appearance: She is well-developed. She is not diaphoretic.  HENT:     Head: Normocephalic and atraumatic.     Nose: Nose normal.  Eyes:     General: No scleral icterus.       Right eye: No discharge.        Left eye: No discharge.     Conjunctiva/sclera: Conjunctivae normal.     Pupils: Pupils are equal, round, and reactive to light.  Neck:     Musculoskeletal: Normal range of motion and neck supple.  Cardiovascular:     Rate and Rhythm: Normal rate and regular rhythm.     Heart sounds: No murmur. No friction rub. No gallop.   Pulmonary:     Effort: Pulmonary effort is normal. No respiratory distress.     Breath sounds: No stridor. Wheezing (faint exp; throughout) present. No rales.  Abdominal:     General: There is no distension.     Palpations: Abdomen is soft.     Tenderness: There is no abdominal tenderness.  Musculoskeletal:        General: No tenderness.  Skin:    General: Skin is warm and dry.     Findings: No erythema or rash.  Neurological:     Mental Status: She is alert and oriented to person, place, and time.     ED Results and Treatments Labs (all labs ordered are listed, but only abnormal results are displayed) Labs  Reviewed  CBC WITH DIFFERENTIAL/PLATELET - Abnormal; Notable for the following components:      Result Value   Hemoglobin 11.6 (*)    MCV 79.8 (*)    MCH 23.5 (*)    MCHC 29.4 (*)    RDW 17.1 (*)    Neutro Abs 1.4 (*)    All other components within normal limits  COMPREHENSIVE METABOLIC PANEL - Abnormal; Notable for the following components:   Sodium 134 (*)    Creatinine, Ser 1.22 (*)    Calcium 8.6 (*)    GFR calc non Af Amer 52 (*)    All other components within normal limits  INFLUENZA PANEL BY PCR (TYPE A & B) - Abnormal; Notable for the following components:   Influenza A By PCR POSITIVE (*)    All other components within normal limits  BRAIN NATRIURETIC PEPTIDE  I-STAT TROPONIN, ED                                                                                                                         EKG  EKG Interpretation  Date/Time:  Saturday October 24 2018 02:45:05 EDT Ventricular Rate:  83 PR Interval:    QRS Duration: 100 QT Interval:  380 QTC Calculation: 447 R Axis:   73 Text Interpretation:  Sinus rhythm Probable left atrial enlargement Borderline T wave abnormalities No significant change since last tracing Confirmed by Drema Pry [phone]) on 10/24/2018 3:56:04 AM      Radiology Dg Chest 2 View  Result Date: 10/24/2018 CLINICAL DATA:  Initial evaluation for acute cough. EXAM: CHEST - 2 VIEW COMPARISON:  Prior radiograph from 10/14/2018 FINDINGS: Cardiomegaly, stable. Mediastinal silhouette within normal limits. The lungs are normally inflated. Mild hazy left basilar opacity, which could reflect atelectasis or possibly mild infiltrate. No acute osseous abnormality identified. IMPRESSION: 1. Mild hazy left basilar opacity. Atelectasis is favored, although a small infiltrate could be considered in the correct clinical setting. 2. Stable cardiomegaly. Electronically Signed   By: Rise Mu M.D.   On: 10/24/2018 03:04   Pertinent labs & imaging results  that were available during my care of the patient were reviewed by me and considered in my medical decision making (see chart for details).  Medications Ordered in ED Medications  albuterol (PROVENTIL HFA;VENTOLIN HFA) 108 (90 Base) MCG/ACT inhaler 2 puff (has no administration in time range)  oseltamivir (TAMIFLU) capsule 75 mg (has no administration in time range)  Procedures Procedures  (including critical care time)  Medical Decision Making / ED Course I have reviewed the nursing notes for this encounter and the patient's prior records (if available in EHR or on provided paperwork).    Work-up reassuring without leukocytosis or significant anemia.  Chest x-ray without evidence of pneumonia or pulmonary edema.  Influenza PCR positive for influenza A.  Given her recent comorbidity, discussed Tamiflu with the patient which she opted for.  Patient was otherwise hemodynamically stable and without respiratory distress, appropriate for outpatient management.  The patient appears reasonably screened and/or stabilized for discharge and I doubt any other medical condition or other [facility] requiring further screening, evaluation, or treatment in the ED at this time prior to discharge.  The patient is safe for discharge with strict return precautions.   Final Clinical Impression(s) / ED Diagnoses Final diagnoses:  Cough  Influenza A    Disposition: Discharge  Condition: Good  I have discussed the results, Dx and Tx plan with the patient who expressed understanding and agree(s) with the plan. Discharge instructions discussed at great length. The patient was given strict return precautions who verbalized understanding of the instructions. No further questions at time of discharge.    ED Discharge Orders         Ordered    oseltamivir (TAMIFLU) 75 MG capsule   Every 12 hours     10/24/18 0456           Follow Up: Laurann Montana, MD [phone] [address] [place] [address] [phone]  Schedule an appointment as soon as possible for a visit  in 3-5 days, If symptoms do not improve or  worsen     This chart was dictated using voice recognition software.  Despite best efforts to proofread,  errors can occur which can change the documentation meaning.   Nira Conn, MD 10/24/18 [phone]

## 2018-10-26 ENCOUNTER — Ambulatory Visit: Payer: BLUE CROSS/BLUE SHIELD | Admitting: Cardiology

## 2018-10-26 ENCOUNTER — Other Ambulatory Visit: Payer: Self-pay

## 2018-10-27 ENCOUNTER — Telehealth: Payer: Self-pay | Admitting: Cardiology

## 2018-10-27 DIAGNOSIS — I110 Hypertensive heart disease with heart failure: Secondary | ICD-10-CM | POA: Diagnosis not present

## 2018-10-27 NOTE — Telephone Encounter (Signed)
Discussed stress test results with patient over the phone. Patient was not seen yesterday, 03/16, due to recent flu. She was started on [facility] during hospital stay. Tolerating medication. Suspect her cardiomyopathy is related to uncontrolled hypertension. Also consider amyloidosis. Will further discuss at her OV. Will reschedule her appt for early next week for close monitoring, but allow time to recover from illness. Discussed avoiding salt in her diet. Patient verbalized understanding. Encouraged to contact us for any new or worsening problems.

## 2018-11-03 ENCOUNTER — Ambulatory Visit: Payer: BLUE CROSS/BLUE SHIELD | Admitting: Cardiology

## 2018-11-04 ENCOUNTER — Telehealth (INDEPENDENT_AMBULATORY_CARE_PROVIDER_SITE_OTHER): Payer: BLUE CROSS/BLUE SHIELD | Admitting: Cardiology

## 2018-11-04 DIAGNOSIS — I472 Ventricular tachycardia: Secondary | ICD-10-CM

## 2018-11-04 DIAGNOSIS — I4729 Other ventricular tachycardia: Secondary | ICD-10-CM

## 2018-11-04 DIAGNOSIS — I5022 Chronic systolic (congestive) heart failure: Secondary | ICD-10-CM

## 2018-11-04 DIAGNOSIS — I10 Essential (primary) hypertension: Secondary | ICD-10-CM

## 2018-11-04 DIAGNOSIS — J101 Influenza due to other identified influenza virus with other respiratory manifestations: Secondary | ICD-10-CM

## 2018-11-04 NOTE — Progress Notes (Signed)
Virtual Visit via Video Note   Subjective:   Sydney Jackson, female    DOB: July 08, 1971, 48 y.o.   MRN: [id number]   I intended to connect with the patient on 11/04/18  by a video enabled telemedicine application and verified that I am speaking with the correct person using two identifiers. However, due to technical difficulties at patient end, I carried a telephone encounter.     I discussed the limitations of evaluation and management by telemedicine and the availability of in person appointments. The patient expressed understanding and agreed to proceed.    48 year old African-American female with hypertension, history of SVT status post ablation, new diagnosis of cardiomyopathy EF 25-30%, grade 2 diastolic dysfunction, grade 2 mitral regurgitation, nonsustained VT seen on event monitor.  Patient underwent Lexiscan nuclear stress test that was suggestive of nonischemic cardiomyopathy without any SPECT evidence of ischemia or infarction.  Patient subsequently had ED visit with cough and fatigue, as well as objective fever.  She was found to be positive with influenza a.  She was treated with Tamiflu.  She does not have any fever, chills at this time.  However, she continues to have dry cough, fatigue and generalized weakness.  She does not have a blood pressure readings with her today.  Reportedly, she saw her primary care provider last week and was told to have low blood pressure.  Creatinine was mildly elevated at 1.2.  She has not had any worsening shortness of breath, orthopnea, PND, leg edema.  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  However, she continues to have severe fatigue and post flu illness.  She has requested me to fill up her FMLA paperwork.  Cardiovascular studies:  Echocardiogram 09/01/2018: Left ventricle cavity is normal in size. Moderate asymmetric hypertrophy of the left ventricle, with posterior wall measuring 1.6 cm. Severe decrease in global wall  motion. Visual LVEF 25-30%. Grade 2 diastolic dysfunction with elevated left atrial pressure.  Calculated EF 30%. Left atrial cavity is moderately dilated. 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 Moderate (Grade II) mitral regurgitation. Inadequate TR jet to estimate PA systolic pressure. Normal right atrial pressure.  Lexiscan Sestamibi stress test 10/19/2018: 1. Lexiscan stress test was performed. Exercise capacity was not assessed. Resting blood pressure was 170/100 mmHg and peak effect blood pressure was 160/90 mmHg. Stress EKG is non diagnostic for ischemia as it is a pharmacologic stress. In addition, the stress electrocardiogram showed sinus tachycardia, normal stress conduction, no stress arrhythmias and nonspecific ST-T changes.   2. The overall quality of the study is good. There is no evidence of abnormal lung activity. Stress and rest SPECT images demonstrate homogeneous tracer distribution throughout the myocardium. LV cavity is dilated on both rest and stress images. Gated SPECT imaging reveals global decrease in myocardial thickening and wall motion. The left ventricular ejection fraction was reduced at 29%.   3. High risk study due to reduced LVEF. No evidence of ischemia/ infarction seen.  Recent labs:  Results for Sydney Jackson (MRN [id number]) as of 11/05/2018 08:41  Ref. Range 10/24/2018 02:43  COMPREHENSIVE METABOLIC PANEL Unknown Rpt (A)  Sodium Latest Ref Range: 135 - 145 mmol/L 134 (L)  Potassium Latest Ref Range: 3.5 - 5.1 mmol/L 4.4  Chloride Latest Ref Range: 98 - 111 mmol/L 101  CO2 Latest Ref Range: 22 - 32 mmol/L 22  Glucose Latest Ref Range: 70 - 99 mg/dL 95  BUN Latest Ref Range: 6 - 20 mg/dL 10  Creatinine Latest Ref Range: 0.44 - 1.00 mg/dL 1.61 (H)  Calcium Latest Ref Range: 8.9 - 10.3 mg/dL 8.6 (L)  Anion gap Latest Ref Range: 5 - 15  11  Alkaline Phosphatase Latest Ref Range: 38 - 126 U/L 47  Albumin Latest Ref Range: 3.5 - 5.0 g/dL 3.6  AST Latest  Ref Range: 15 - 41 U/L 18  ALT Latest Ref Range: 0 - 44 U/L 16  Total Protein Latest Ref Range: 6.5 - 8.1 g/dL 7.2  Total Bilirubin Latest Ref Range: 0.3 - 1.2 mg/dL 0.4  GFR, Est Non African American Latest Ref Range: >60 mL/min 52 (L)  GFR, Est African American Latest Ref Range: >60 mL/min >60  B Natriuretic Peptide Latest Ref Range: 0.0 - 100.0 pg/mL 25.3   Results for Sydney Jackson (MRN [id number]) as of 11/05/2018 08:41  Ref. Range 10/24/2018 02:43  WBC Latest Ref Range: 4.0 - 10.5 K/uL 4.3  RBC Latest Ref Range: 3.87 - 5.11 MIL/uL 4.94  Hemoglobin Latest Ref Range: 12.0 - 15.0 g/dL 81.1 (L)  HCT Latest Ref Range: 36.0 - 46.0 % 39.4  MCV Latest Ref Range: 80.0 - 100.0 fL 79.8 (L)  MCH Latest Ref Range: 26.0 - 34.0 pg 23.5 (L)  MCHC Latest Ref Range: 30.0 - 36.0 g/dL 91.4 (L)  RDW Latest Ref Range: 11.5 - 15.5 % 17.1 (H)  Platelets Latest Ref Range: 150 - 400 K/uL 334  nRBC Latest Ref Range: 0.0 - 0.2 % 0.0   Results for Sydney Jackson (MRN [id number]) as of 11/05/2018 08:41  Ref. 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 NSVT (nonsustained ventricular tachycardia) (HCC) Dur to nonischemic cardiomyopathy. Continue guideline directed heart failure maangmeent  2. Chronic systolic (congestive) heart failure (HCC) Continue Entresto 49-51 mg bid, carvedilol 12.5 mg bid, spironolactone 25 mg daily. When she does have cough, it is unclear if it could be due to [facility] or as a residual cough after her recent flu illness.  I recommend that we wait for 2-3 weeks to see if post influenza cough would recede.  If cough persists in  spite of no other symptoms, I will then consider switching Entresto to losartan.  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  I have encouraged the patient to obtain blood pressure monitor for regular monitoring. Repeat echocardiogram in 3 months.  3. Essential hypertension As above.  4. Influenza B She has post flu illness at this time.  In view of her cardiomyopathy, she may need longer recovery period.  I have completed her FMLA paperwork.  I will see her back in 3 weeks.    Elder Negus, MD [facility] Pager: [phone] Office: [phone] If no answer Cell [phone]

## 2018-11-05 DIAGNOSIS — J101 Influenza due to other identified influenza virus with other respiratory manifestations: Secondary | ICD-10-CM | POA: Insufficient documentation

## 2018-11-05 DIAGNOSIS — I4729 Other ventricular tachycardia: Secondary | ICD-10-CM | POA: Insufficient documentation

## 2018-11-05 DIAGNOSIS — I472 Ventricular tachycardia: Secondary | ICD-10-CM | POA: Insufficient documentation

## 2018-11-19 ENCOUNTER — Encounter: Payer: Self-pay | Admitting: Cardiology

## 2018-11-19 ENCOUNTER — Other Ambulatory Visit: Payer: Self-pay

## 2018-11-19 DIAGNOSIS — I5022 Chronic systolic (congestive) heart failure: Secondary | ICD-10-CM

## 2018-11-19 MED ORDER — CARVEDILOL 12.5 MG PO TABS: 12.5000 mg | ORAL_TABLET | Freq: Two times a day (BID) | ORAL | 0 refills | Status: DC

## 2018-11-23 ENCOUNTER — Other Ambulatory Visit: Payer: Self-pay

## 2018-11-23 ENCOUNTER — Encounter: Payer: Self-pay | Admitting: Cardiology

## 2018-11-23 ENCOUNTER — Ambulatory Visit: Payer: BLUE CROSS/BLUE SHIELD | Admitting: Cardiology

## 2018-11-23 VITALS — BP 159/90 | Ht 65.0 in | Wt 220.0 lb

## 2018-11-23 DIAGNOSIS — I5022 Chronic systolic (congestive) heart failure: Secondary | ICD-10-CM | POA: Diagnosis not present

## 2018-11-23 DIAGNOSIS — Z6836 Body mass index (BMI) 36.0-36.9, adult: Secondary | ICD-10-CM

## 2018-11-23 DIAGNOSIS — R400 Somnolence: Secondary | ICD-10-CM | POA: Diagnosis not present

## 2018-11-23 DIAGNOSIS — I428 Other cardiomyopathies: Secondary | ICD-10-CM | POA: Diagnosis not present

## 2018-11-23 DIAGNOSIS — I10 Essential (primary) hypertension: Secondary | ICD-10-CM | POA: Diagnosis not present

## 2018-11-23 MED ORDER — CARVEDILOL 25 MG PO TABS: 25.0000 mg | ORAL_TABLET | Freq: Two times a day (BID) | ORAL | 1 refills | Status: DC

## 2018-11-23 MED ORDER — SACUBITRIL-VALSARTAN 97-103 MG PO TABS: 1.0000 | ORAL_TABLET | Freq: Two times a day (BID) | ORAL | 1 refills | Status: DC

## 2018-11-23 NOTE — Progress Notes (Signed)
Virtual Visit via Video Note: This visit type was conducted due to national recommendations for restrictions regarding the COVID-19 Pandemic (e.g. social distancing).  This format is felt to be most appropriate for this patient at this time.  All issues noted in this document were discussed and addressed.  No physical exam was performed (except for noted visual exam findings with Telehealth visits).  The patient has consented to conduct a Telehealth visit and understands insurance will be billed.   I connected with@, on 11/23/18 at  by a video enabled telemedicine application and verified that I am speaking with the correct person using two identifiers.   I discussed the limitations of evaluation and management by telemedicine and the availability of in person appointments. The patient expressed understanding and agreed to proceed.   I have discussed with patient regarding the safety during COVID Pandemic and steps and precautions to be taken including social distancing, frequent hand wash and use of detergent soap, gels with the patient. I asked the patient to avoid touching mouth, nose, eyes, ears with the hands. I encouraged regular walking around the neighborhood and exercise and regular diet, as long as social distancing can be maintained.   Subjective:  Primary Physician/Referring:  Sydney Montana, MD  Patient ID: Sydney Jackson, female    DOB: 1971-07-11, 48 y.o.   MRN: [id number]  Chief Complaint  Patient presents with   Hypertension   Follow-up    HPI: Sydney Jackson  is a 48 y.o. female  with hypertension, history of SVT status post ablation at age 71-25 years in [place], [place], new diagnosis of cardiomyopathy diagnosed in Jan 2020 with EF 25-30%, grade 2 diastolic dysfunction, grade 2 mitral regurgitation, nonsustained VT seen on event monitor. with normal perfusion and low EF by nuclear stress in March 2020.   Following diagnosis in March, she was admitted with influenza and  was treated with Tamiflu.  She still has mild cough and very mild dyspnea but overall has started to feel well.  This is her one-month office visit and follow-up of cardiomyopathy and to discuss hypertension.  She has not had any worsening shortness of breath, orthopnea, PND, leg edema.  Past Medical History:  Diagnosis Date   Depression    Herpes simplex disease    History of syncope    s/p  heart valve repair  1995   Hypertension    NSVT (nonsustained ventricular tachycardia) (HCC)    Palpitations    Right ACL tear    Right knee meniscal tear    S/P heart valve repair    1995--  pt does not remember which one    Past Surgical History:  Procedure Laterality Date   ABLATION     CARDIAC VALVE SURGERY  1995 (approx.  age 65's)   pt does not remember which valve   KNEE ARTHROSCOPY Right 01/21/2014   Procedure: RIGHT  KNEE ARTHROSCOPY WITH DEBRIDEMENT PARTIAL MENISECTOMY AND AUTOGRAPH ACL RECONSTRUCTION;  Surgeon: Sydney Mcalpine, MD;  Location: [facility] [place];  Service: Orthopedics;  Laterality: Right;   KNEE ARTHROSCOPY N/A 01/2014   KNEE REPAIR EXTENSOR MECHANISM  2015   RETINAL DETACHMENT REPAIR W/ SCLERAL BUCKLE LE Left 08-27-2006    Social History   Socioeconomic History   Marital status: Single    Spouse name: Not on file   Number of children: 4   Years of education: Not on file   Highest education level: Not on file  Occupational History   Occupation: customer  service    Employer: [employer]  Social Needs   Financial resource strain: Not on file   Food insecurity:    Worry: Not on file    Inability: Not on file   Transportation needs:    Medical: Not on file    Non-medical: Not on file  Tobacco Use   Smoking status: Former Smoker    Types: Cigarettes   Smokeless tobacco: Never Used   Tobacco comment: quit 2010  Substance and Sexual Activity   Alcohol use: Yes    Comment: Glass or two of wine on occasion, past  history of heavy use    Drug use: No    Comment: hx   THC use, ended 10+ years ago-- (01-17-2014   DENIES   Sexual activity: Not Currently    Partners: Male  Lifestyle   Physical activity:    Days per week: Not on file    Minutes per session: Not on file   Stress: Not on file  Relationships   Social connections:    Talks on phone: Not on file    Gets together: Not on file    Attends religious service: Not on file    Active member of club or organization: Not on file    Attends meetings of clubs or organizations: Not on file    Relationship status: Not on file   Intimate partner violence:    Fear of current or ex partner: Not on file    Emotionally abused: Not on file    Physically abused: Not on file    Forced sexual activity: Not on file  Other Topics Concern   Not on file  Social History Narrative   ** Merged History Encounter **       Sydney Jackson was born and grew up in [place]. She reports that her childhood was "okay." Her biological father left when she was 69 or 71 years of age. She has 7 half-siblings by her father, and one half-sibling by her mother. She graduated high school and attended one year of college. She has never been married. She has 4 children, a 69 year old son, a 47 year old son, a 28 year old daughter, and her 37 year old daughter. Her 60 year old and 63 year old live with her now. 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 She denies any legal problems. She affiliates as a Loss adjuster, chartered. She reports that she has one friend who she uses for social support.    Current Outpatient Medications on File Prior to Visit  Medication Sig Dispense Refill   albuterol (PROVENTIL) (2.5 MG/3ML) 0.083% nebulizer solution Take 3 mLs (2.5 mg total) by nebulization every 6 (six) hours as needed for wheezing or shortness of breath. 75 mL 12   nitroGLYCERIN (NITROSTAT) 0.4 MG SL tablet Place 1  tablet (0.4 mg total) under the tongue every 5 (five) minutes as needed for chest pain (CP or SOB). 30 tablet 0   sertraline (ZOLOFT) 100 MG tablet Take 100 mg by mouth daily.     spironolactone (ALDACTONE) 50 MG tablet Take 1 tablet (50 mg total) by mouth daily. 30 tablet 0   No current facility-administered medications on file prior to visit.     Review of Systems  Constitution: Positive for malaise/fatigue (and day time somnolence). Negative for chills, decreased appetite and weight gain.  Cardiovascular: Positive for dyspnea on exertion (mild and stable). Negative for leg swelling, orthopnea and syncope.  Endocrine: Negative for cold  intolerance.  Hematologic/Lymphatic: Does not bruise/bleed easily.  Musculoskeletal: Negative for joint swelling.  Gastrointestinal: Negative for abdominal pain, anorexia and change in bowel habit.  Neurological: Negative for headaches and light-headedness.  Psychiatric/Behavioral: Negative for depression and substance abuse.  All other systems reviewed and are negative.     Objective:  Blood pressure (!) 159/90, height  (1.651 m), weight 220 lb (99.8 kg). Body mass index is 36.61 kg/m.  Physical Exam  Constitutional: She is oriented to person, place, and time. She appears well-developed.  Obese  Neck: Neck supple.  Pulmonary/Chest: Effort normal. No respiratory distress.  Neurological: She is alert and oriented to person, place, and time.   Radiology: No results found.  Laboratory Examination:  CMP Latest Ref Rng & Units 10/24/2018 10/17/2018 10/16/2018  Glucose 70 - 99 mg/dL 95 97 94  BUN 6 - 20 mg/dL Creatinine 0.44 - 1.00 mg/dL 1.61(W) 9.60 4.54  Sodium 135 - 145 mmol/L 134(L) 135 137  Potassium 3.5 - 5.1 mmol/L 4.4 3.9 3.6  Chloride 98 - 111 mmol/L 101 104 105  CO2 22 - 32 mmol/L Calcium 8.9 - 10.3 mg/dL 0.9(W) 1.1(B) 1.4(N)  Total Protein 6.5 - 8.1 g/dL 7.2 - -  Total Bilirubin 0.3 - 1.2 mg/dL 0.4 - -    Alkaline Phos 38 - 126 U/L 47 - -  AST 15 - 41 U/L 18 - -  ALT 0 - 44 U/L 16 - -   CBC Latest Ref Rng & Units 10/24/2018 10/17/2018 10/16/2018  WBC 4.0 - 10.5 K/uL 4.3 5.9 5.4  Hemoglobin 12.0 - 15.0 g/dL 11.6(L) 11.6(L) 11.9(L)  Hematocrit 36.0 - 46.0 % 39.4 37.9 37.9  Platelets 150 - 400 K/uL 334 539(H) 493(H)   Lipid Panel  No results found for: CHOL, TRIG, HDL, CHOLHDL, VLDL, LDLCALC, LDLDIRECT HEMOGLOBIN A1C No results found for: HGBA1C, MPG TSH Recent Labs    10/15/18 1342  TSH 0.861     Cardiac studies:   Echocardiogram 09/01/2018: Left ventricle cavity is normal in size. Moderate asymmetric hypertrophy of the left ventricle, with posterior wall measuring 1.6 cm. Severe decrease in global wall motion. Visual LVEF 25-30%. Grade 2 diastolic dysfunction with elevated left atrial pressure.  Calculated EF 30%. Left atrial cavity is moderately dilated. 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 Moderate (Grade II) mitral regurgitation. Inadequate TR jet to estimate PA systolic pressure. Normal right atrial pressure.  Lexiscan Sestamibi stress test 10/19/2018: 1. Lexiscan stress test was performed. Exercise capacity was not assessed. Resting blood pressure was 170/100 mmHg and peak effect blood pressure was 160/90 mmHg. Stress EKG is non diagnostic for ischemia as it is a pharmacologic stress. In addition, the stress electrocardiogram showed sinus tachycardia, normal stress conduction, no stress arrhythmias and nonspecific ST-T changes.   2. The overall quality of the study is good. There is no evidence of abnormal lung activity. Stress and rest SPECT images demonstrate homogeneous tracer distribution throughout the myocardium. LV cavity is dilated on both rest and stress images. Gated SPECT imaging reveals global decrease in myocardial thickening and wall motion. The left ventricular ejection fraction was reduced at 29%.   3. High risk study due to reduced LVEF. No evidence of  ischemia/ infarction seen.  Assessment:    Chronic systolic (congestive) heart failure (HCC) - Plan: sacubitril-valsartan (ENTRESTO) 97-103 MG, Basic Metabolic Panel (BMET), carvedilol (COREG) 25 MG tablet  Non-ischemic cardiomyopathy (HCC) - Plan: sacubitril-valsartan (ENTRESTO) 97-103 MG, carvedilol (COREG) 25 MG tablet  Essential hypertension  Uncontrolled daytime somnolence  Class 2 severe obesity due to excess calories with serious comorbidity and body mass index (BMI) of 36.0 to 36.9 in adult [facility])  EKG 10/14/2018: Sinus tachycardia 100 bpm. Left atrial enlargmeent. No ischemic changes.   Recommendations:    Patient is presently doing well and does not appear to be in acute distress and does not appear to be in florid congestive heart failure by physical exam on the video.   I have discussed with her regarding weight loss and the importance of weight loss with regard to cardiomyopathy.  Suspect hypertensive heart disease.  Blood pressure is still elevated, I'll go increase Entresto to 97/103 mg b.i.d. and after 4-5 days if she tolerates this I have advised her to increase carvedilol from 12.5 mg to 25 mg b.i.d.  She BMP in 10 days, she is also on spironolactone 50 mg daily and hence renal function and potassium closely monitored. Avoidance of excessive fruit is important.  She has daytime somnolence, moderate obesity, she probably has obstructive sleep apnea and in view of nonsustained ventricular tachycardia noted on event monitor and also cardiomyopathy and hypertension.  African-American female, she would benefit from sleep study which I will discuss with her next office visit in 2 weeks in the office.  She'll very soon need repeat echocardiogram to reevaluate her LV systolic function 4-6 weeks.  Yates Decamp, MD, [facility] 11/23/2018, 9:15 PM [facility] Pager: [phone] Office: [phone] If no answer Cell [phone]

## 2018-11-27 ENCOUNTER — Encounter: Payer: Self-pay | Admitting: Cardiology

## 2018-12-07 NOTE — Progress Notes (Signed)
Virtual Visit via Video Note: This visit type was conducted due to national recommendations for restrictions regarding the COVID-19 Pandemic (e.g. social distancing).  This format is felt to be most appropriate for this patient at this time.  All issues noted in this document were discussed and addressed.  No physical exam was performed (except for noted visual exam findings with Telehealth visits).  The patient has consented to conduct a Telehealth visit and understands insurance will be billed.   I connected with@, on 12/08/18 at  by a video enabled telemedicine application and verified that I am speaking with the correct person using two identifiers.   I discussed the limitations of evaluation and management by telemedicine and the availability of in person appointments. The patient expressed understanding and agreed to proceed.   I have discussed with patient regarding the safety during COVID Pandemic and steps and precautions to be taken including social distancing, frequent hand wash and use of detergent soap, gels with the patient. I asked the patient to avoid touching mouth, nose, eyes, ears with the hands. I encouraged regular walking around the neighborhood and exercise and regular diet, as long as social distancing can be maintained.   Subjective:  Primary Physician/Referring:  Laurann Montana, MD  Patient ID: Sydney Jackson, female    DOB: 1971/01/06, 48 y.o.   MRN: [id number]  Chief Complaint  Patient presents with   Congestive Heart Failure    2 week f/u , needs spironolactone refill    HPI: Sydney Jackson  is a 48 y.o. female  with hypertension, history of SVT status post ablation at age 93-25 years in [place], [place], new diagnosis of cardiomyopathy diagnosed in Jan 2020 with EF 25-30%, grade 2 diastolic dysfunction, grade 2 mitral regurgitation, nonsustained VT seen on event monitor with normal perfusion and low EF by nuclear stress in March 2020. She also has  hypertension.  Following diagnosis in March, she was admitted with influenza and was treated with Tamiflu.  She still has mild cough and very mild dyspnea but overall has started to feel well.  On her last office visit which I had seen her virtually, 2 weeks ago I'd increased Entresto to maximum dose along with carvedilol to maximum dose.  She underwent BMP and now presents for repeat virtually visit to discuss cardio myopathy and hypertension management.  She has not had any worsening shortness of breath, orthopnea, PND, leg edema. Now states cough is improved and overall has started to feel better.   Past Medical History:  Diagnosis Date   Depression    Herpes simplex disease    History of syncope    s/p  heart valve repair  1995   Hypertension    NSVT (nonsustained ventricular tachycardia) (HCC)    Palpitations    Right ACL tear    Right knee meniscal tear    S/P heart valve repair    1995--  pt does not remember which one    Past Surgical History:  Procedure Laterality Date   ABLATION     CARDIAC VALVE SURGERY  1995 (approx.  age 16's)   pt does not remember which valve   KNEE ARTHROSCOPY Right 01/21/2014   Procedure: RIGHT  KNEE ARTHROSCOPY WITH DEBRIDEMENT PARTIAL MENISECTOMY AND AUTOGRAPH ACL RECONSTRUCTION;  Surgeon: Eugenia Mcalpine, MD;  Location: [facility];  Service: Orthopedics;  Laterality: Right;   KNEE ARTHROSCOPY N/A 01/2014   KNEE REPAIR EXTENSOR MECHANISM  2015   RETINAL DETACHMENT REPAIR W/ SCLERAL  BUCKLE LE Left 08-27-2006    Social History   Socioeconomic History   Marital status: Single    Spouse name: Not on file   Number of children: 4   Years of education: Not on file   Highest education level: Not on file  Occupational History   Occupation: customer service    Employer: [employer]  Social Needs   Financial resource strain: Not on file   Food insecurity:    Worry: Not on file    Inability: Not on file    Transportation needs:    Medical: Not on file    Non-medical: Not on file  Tobacco Use   Smoking status: Former Smoker    Types: Cigarettes   Smokeless tobacco: Never Used   Tobacco comment: quit 2010  Substance and Sexual Activity   Alcohol use: Yes    Comment: Glass or two of wine on occasion, past history of heavy use    Drug use: No    Comment: hx   THC use, ended 10+ years ago-- (01-17-2014   DENIES   Sexual activity: Not Currently    Partners: Male  Lifestyle   Physical activity:    Days per week: Not on file    Minutes per session: Not on file   Stress: Not on file  Relationships   Social connections:    Talks on phone: Not on file    Gets together: Not on file    Attends religious service: Not on file    Active member of club or organization: Not on file    Attends meetings of clubs or organizations: Not on file    Relationship status: Not on file   Intimate partner violence:    Fear of current or ex partner: Not on file    Emotionally abused: Not on file    Physically abused: Not on file    Forced sexual activity: Not on file  Other Topics Concern   Not on file  Social History Narrative   ** Merged History Encounter **       [name] was born and grew up in [place]. She reports that her childhood was "okay." Her biological father left when she was 15 or 82 years of age. She has 7 half-siblings by her father, and one half-sibling by her mother. She graduated high school and attended one year of college. She has never been married. She has 4 children, a 36 year old son, a 84 year old son, a 82 year old daughter, and her 68 year old daughter. Her 65 year old and 63 year old live with her now. 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 She denies any legal problems. She affiliates as a Loss adjuster, chartered. She reports that she has one friend who she uses for social support.     Current Outpatient Medications on File Prior to Visit  Medication Sig Dispense Refill   albuterol (PROVENTIL) (2.5 MG/3ML) 0.083% nebulizer solution Take 3 mLs (2.5 mg total) by nebulization every 6 (six) hours as needed for wheezing or shortness of breath. 75 mL 12   carvedilol (COREG) 25 MG tablet Take 1 tablet (25 mg total) by mouth 2 (two) times daily. 180 tablet 1   nitroGLYCERIN (NITROSTAT) 0.4 MG SL tablet Place 1 tablet (0.4 mg total) under the tongue every 5 (five) minutes as needed for chest pain (CP or SOB). 30 tablet 0   sacubitril-valsartan (ENTRESTO) 97-103 MG Take 1 tablet by mouth 2 (two) times  daily. 180 tablet 1   sertraline (ZOLOFT) 100 MG tablet Take 100 mg by mouth daily.     No current facility-administered medications on file prior to visit.     Review of Systems  Constitution: Positive for malaise/fatigue (and day time somnolence). Negative for chills, decreased appetite and weight gain.  Cardiovascular: Positive for dyspnea on exertion (mild and stable). Negative for leg swelling, orthopnea and syncope.  Endocrine: Negative for cold intolerance.  Hematologic/Lymphatic: Does not bruise/bleed easily.  Musculoskeletal: Negative for joint swelling.  Gastrointestinal: Negative for abdominal pain, anorexia and change in bowel habit.  Neurological: Negative for headaches and light-headedness.  Psychiatric/Behavioral: Negative for depression and substance abuse.  All other systems reviewed and are negative.     Objective:  Blood pressure (!) 159/94, height 5\' 5"  (1.651 m), weight 230 lb (104.3 kg). Body mass index is 38.27 kg/m. Virtual Visit via Telephone Note: Patient unable to use video assisted device.  This visit type was conducted due to national recommendations for restrictions regarding the COVID-19 Pandemic (e.g. social distancing).  This format is felt to be most appropriate for this patient at this time.  All issues noted in this document were  discussed and addressed.  No physical exam was performed.  The patient has consented to conduct a Telehealth visit and understands insurance will be billed.   I connected with@, on 12/08/18 at  by TELEPHONE and verified that I am speaking with the correct person using two identifiers.   I discussed the limitations of evaluation and management by telemedicine and the availability of in person appointments. The patient expressed understanding and agreed to proceed.   I have discussed with patient regarding the safety during COVID Pandemic and steps and precautions to be taken including social distancing, frequent hand wash and use of detergent soap, gels with the patient. I asked the patient to avoid touching mouth, nose, eyes, ears with the hands. I encouraged regular walking around the neighborhood and exercise and regular diet, as long as social distancing can be maintained.  Physical Exam  Constitutional: She is oriented to person, place, and time. She appears well-developed.  Obese  Neck: Neck supple.  Pulmonary/Chest: Effort normal. No respiratory distress.  Neurological: She is alert and oriented to person, place, and time.   Radiology: No results found.  Laboratory Examination: GFR calc Af Amer 10/24/2018 >60 mL/min >60     CMP Latest Ref Rng & Units 10/24/2018 10/17/2018 10/16/2018  Glucose 70 - 99 mg/dL 95 97 94  BUN 6 - 20 mg/dL 10 17 16   Creatinine 0.44 - 1.00 mg/dL 6.76(H) 2.09 4.70  Sodium 135 - 145 mmol/L 134(L) 135 137  Potassium 3.5 - 5.1 mmol/L 4.4 3.9 3.6  Chloride 98 - 111 mmol/L 101 104 105  CO2 22 - 32 mmol/L 22 24 24   Calcium 8.9 - 10.3 mg/dL 9.6(G) 8.3(M) 6.2(H)  Total Protein 6.5 - 8.1 g/dL 7.2 - -  Total Bilirubin 0.3 - 1.2 mg/dL 0.4 - -  Alkaline Phos 38 - 126 U/L 47 - -  AST 15 - 41 U/L 18 - -  ALT 0 - 44 U/L 16 - -   CBC Latest Ref Rng & Units 10/24/2018 10/17/2018 10/16/2018  WBC 4.0 - 10.5 K/uL 4.3 5.9 5.4  Hemoglobin 12.0 - 15.0 g/dL 11.6(L) 11.6(L) 11.9(L)   Hematocrit 36.0 - 46.0 % 39.4 37.9 37.9  Platelets 150 - 400 K/uL 334 539(H) 493(H)    Recent Labs    10/15/18 1342  TSH 0.861   Cardiac studies:   Echocardiogram 09/01/2018: Left ventricle cavity is normal in size. Moderate asymmetric hypertrophy of the left ventricle, with posterior wall measuring 1.6 cm. Severe decrease in global wall motion. Visual LVEF 25-30%. Grade 2 diastolic dysfunction with elevated left atrial pressure.  Calculated EF 30%. Left atrial cavity is moderately dilated. 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 Moderate (Grade II) mitral regurgitation. Inadequate TR jet to estimate PA systolic pressure. Normal right atrial pressure.  Lexiscan Sestamibi stress test 10/19/2018: 1. Lexiscan stress test was performed. Exercise capacity was not assessed. Resting blood pressure was 170/100 mmHg and peak effect blood pressure was 160/90 mmHg. Stress EKG is non diagnostic for ischemia as it is a pharmacologic stress. In addition, the stress electrocardiogram showed sinus tachycardia, normal stress conduction, no stress arrhythmias and nonspecific ST-T changes.   2. The overall quality of the study is good. There is no evidence of abnormal lung activity. Stress and rest SPECT images demonstrate homogeneous tracer distribution throughout the myocardium. LV cavity is dilated on both rest and stress images. Gated SPECT imaging reveals global decrease in myocardial thickening and wall motion. The left ventricular ejection fraction was reduced at 29%.   3. High risk study due to reduced LVEF. No evidence of ischemia/ infarction seen.  Assessment:    Chronic systolic (congestive) heart failure (HCC)  Non-ischemic cardiomyopathy (HCC) - Plan: Ambulatory referral to Sleep Studies  Essential hypertension  Class 2 severe obesity due to excess calories with serious comorbidity and body mass index (BMI) of 38.0 to 38.9 in adult [facility]) - Plan: Ambulatory referral to Sleep  Studies  Malaise and fatigue  EKG 10/14/2018: Sinus tachycardia 100 bpm. Left atrial enlargmeent. No ischemic changes.   Recommendations:    Patient is presently doing well and does not appear to be in acute distress and does not appear to be in florid congestive heart failure by physical exam on the video.   I had increased Entresto to the maximum dose along with increasing carvedilol to 25 mg b.i.d. She is also on Aldactone 50 mg daily for CHF and hypertension. I reviewed the results of the recently performed labs, which revealed very mild renal insufficiency.  The benefits of being on guidelines directed medical therapy was discussed at length including mortality benefit.  Patient is willing to continue the same medications for now, I'll like to see her back in the office in 3 months and will repeat echocardiogram to reevaluate LV systolic function.  She has had history of NSVT in the past but there is no syncope or dizziness.  Suspect hypertensive heart disease to be etiology for cardiomyopathy along with obesity.  She has daytime somnolence, moderate obesity, she probably has obstructive sleep apnea and in view of nonsustained ventricular tachycardia noted on event monitor and also cardiomyopathy and hypertension, African-American race, she would benefit from sleep study which I Will refer to be evaluated by Dr. Frances Furbish.  Her blood pressure is elevated today, she is all of spironolactone for the past 10 days, will restart this, she'll obtain BMP in 10 days.  She'll bring me lipid profile testing from her PCP on her next office visit which will be in 8-10 weeks after repeat echocardiogram.  Weight loss was discussed extensively.   Yates Decamp, MD, [facility] 12/08/2018, 12:15 PM [facility] Pager: [phone] Office: [phone] If no answer Cell [phone]

## 2018-12-08 ENCOUNTER — Ambulatory Visit: Payer: Managed Care, Other (non HMO) | Admitting: Cardiology

## 2018-12-08 ENCOUNTER — Other Ambulatory Visit: Payer: Self-pay

## 2018-12-08 ENCOUNTER — Encounter: Payer: Self-pay | Admitting: Cardiology

## 2018-12-08 VITALS — BP 159/94 | Ht 65.0 in | Wt 230.0 lb

## 2018-12-08 DIAGNOSIS — R5381 Other malaise: Secondary | ICD-10-CM

## 2018-12-08 DIAGNOSIS — Z6838 Body mass index (BMI) 38.0-38.9, adult: Secondary | ICD-10-CM

## 2018-12-08 DIAGNOSIS — I428 Other cardiomyopathies: Secondary | ICD-10-CM | POA: Diagnosis not present

## 2018-12-08 DIAGNOSIS — R5383 Other fatigue: Secondary | ICD-10-CM

## 2018-12-08 DIAGNOSIS — I5022 Chronic systolic (congestive) heart failure: Secondary | ICD-10-CM

## 2018-12-08 DIAGNOSIS — I10 Essential (primary) hypertension: Secondary | ICD-10-CM | POA: Diagnosis not present

## 2018-12-08 MED ORDER — SPIRONOLACTONE 50 MG PO TABS: 50.0000 mg | ORAL_TABLET | Freq: Every day | ORAL | 3 refills | Status: DC

## 2018-12-12 ENCOUNTER — Emergency Department (HOSPITAL_COMMUNITY): Payer: BLUE CROSS/BLUE SHIELD

## 2018-12-12 ENCOUNTER — Other Ambulatory Visit: Payer: Self-pay

## 2018-12-12 ENCOUNTER — Encounter (HOSPITAL_COMMUNITY): Payer: Self-pay | Admitting: Emergency Medicine

## 2018-12-12 ENCOUNTER — Emergency Department (HOSPITAL_COMMUNITY)
Admission: EM | Admit: 2018-12-12 | Discharge: 2018-12-12 | Disposition: A | Discharge status: Home / Self Care | Payer: BLUE CROSS/BLUE SHIELD | Attending: Emergency Medicine | Admitting: Emergency Medicine

## 2018-12-12 DIAGNOSIS — R1013 Epigastric pain: Secondary | ICD-10-CM | POA: Diagnosis not present

## 2018-12-12 DIAGNOSIS — I110 Hypertensive heart disease with heart failure: Secondary | ICD-10-CM | POA: Insufficient documentation

## 2018-12-12 DIAGNOSIS — R112 Nausea with vomiting, unspecified: Secondary | ICD-10-CM | POA: Diagnosis not present

## 2018-12-12 DIAGNOSIS — I10 Essential (primary) hypertension: Secondary | ICD-10-CM | POA: Diagnosis not present

## 2018-12-12 DIAGNOSIS — Z87891 Personal history of nicotine dependence: Secondary | ICD-10-CM | POA: Insufficient documentation

## 2018-12-12 DIAGNOSIS — K2980 Duodenitis without bleeding: Secondary | ICD-10-CM | POA: Diagnosis not present

## 2018-12-12 DIAGNOSIS — Z79899 Other long term (current) drug therapy: Secondary | ICD-10-CM | POA: Diagnosis not present

## 2018-12-12 DIAGNOSIS — R1110 Vomiting, unspecified: Secondary | ICD-10-CM | POA: Diagnosis not present

## 2018-12-12 DIAGNOSIS — I5022 Chronic systolic (congestive) heart failure: Secondary | ICD-10-CM | POA: Diagnosis not present

## 2018-12-12 LAB — COMPREHENSIVE METABOLIC PANEL
ALT: 12 U/L (ref 0–44)
AST: 14 U/L — ABNORMAL LOW (ref 15–41)
Albumin: 3.6 g/dL (ref 3.5–5.0)
Alkaline Phosphatase: 62 U/L (ref 38–126)
Anion gap: 13 (ref 5–15)
BUN: 6 mg/dL (ref 6–20)
CO2: 25 mmol/L (ref 22–32)
Calcium: 9.2 mg/dL (ref 8.9–10.3)
Chloride: 99 mmol/L (ref 98–111)
Creatinine, Ser: 0.92 mg/dL (ref 0.44–1.00)
GFR calc Af Amer: >60 mL/min (ref >60)
GFR calc non Af Amer: >60 mL/min (ref >60)
Glucose, Bld: 113 mg/dL — ABNORMAL HIGH (ref 70–99)
Potassium: 3.6 mmol/L (ref 3.5–5.1)
Sodium: 137 mmol/L (ref 135–145)
Total Bilirubin: 0.5 mg/dL (ref 0.3–1.2)
Total Protein: 7.3 g/dL (ref 6.5–8.1)

## 2018-12-12 LAB — CBC
HCT: 38.9 % (ref 36.0–46.0)
Hemoglobin: 12 g/dL (ref 12.0–15.0)
MCH: 24.8 pg — ABNORMAL LOW (ref 26.0–34.0)
MCHC: 30.8 g/dL (ref 30.0–36.0)
MCV: 80.4 fL (ref 80.0–100.0)
Platelets: 450 10*3/uL — ABNORMAL HIGH (ref 150–400)
RBC: 4.84 MIL/uL (ref 3.87–5.11)
RDW: 18.4 % — ABNORMAL HIGH (ref 11.5–15.5)
WBC: 7.2 10*3/uL (ref 4.0–10.5)
nRBC: 0 % (ref 0.0–0.2)

## 2018-12-12 LAB — I-STAT BETA HCG BLOOD, ED (MC, WL, AP ONLY): I-stat hCG, quantitative: <5 m[IU]/mL (ref <5)

## 2018-12-12 LAB — URINALYSIS, ROUTINE W REFLEX MICROSCOPIC
Bacteria, UA: NONE SEEN
Bilirubin Urine: NEGATIVE
Glucose, UA: NEGATIVE mg/dL
Hgb urine dipstick: NEGATIVE
Ketones, ur: 5 mg/dL — AB
Nitrite: NEGATIVE
Protein, ur: 30 mg/dL — AB
Specific Gravity, Urine: 1.012 (ref 1.005–1.030)
pH: 6 (ref 5.0–8.0)

## 2018-12-12 LAB — LIPASE, BLOOD: Lipase: 34 U/L (ref 11–51)

## 2018-12-12 LAB — TROPONIN I: Troponin I: <0.03 ng/mL (ref <0.03)

## 2018-12-12 MED ORDER — HYDROCODONE-ACETAMINOPHEN 5-325 MG PO TABS: 1.0000 | ORAL_TABLET | Freq: Four times a day (QID) | ORAL | 0 refills | Status: DC | PRN

## 2018-12-12 MED ORDER — IOHEXOL 300 MG/ML  SOLN
100.0000 mL | Freq: Once | INTRAMUSCULAR | Status: AC | PRN
  Administered 2018-12-12: 100 mL via INTRAVENOUS

## 2018-12-12 MED ORDER — ONDANSETRON HCL 4 MG/2ML IJ SOLN
4.0000 mg | Freq: Once | INTRAMUSCULAR | Status: AC
  Administered 2018-12-12: 07:00:00 4 mg via INTRAVENOUS
  Filled 2018-12-12: qty 2

## 2018-12-12 MED ORDER — SUCRALFATE 1 G PO TABS: 1.0000 g | ORAL_TABLET | Freq: Three times a day (TID) | ORAL | 0 refills | Status: DC

## 2018-12-12 MED ORDER — PROMETHAZINE HCL 25 MG PO TABS: 25.0000 mg | ORAL_TABLET | Freq: Three times a day (TID) | ORAL | 0 refills | Status: DC | PRN

## 2018-12-12 MED ORDER — PANTOPRAZOLE SODIUM 40 MG IV SOLR
40.0000 mg | Freq: Once | INTRAVENOUS | Status: AC
  Administered 2018-12-12: 40 mg via INTRAVENOUS
  Filled 2018-12-12: qty 40

## 2018-12-12 MED ORDER — SODIUM CHLORIDE 0.9 % IV SOLN
Freq: Once | INTRAVENOUS | Status: AC
  Administered 2018-12-12: 09:00:00 via INTRAVENOUS

## 2018-12-12 MED ORDER — PANTOPRAZOLE SODIUM 40 MG PO TBEC: 40.0000 mg | DELAYED_RELEASE_TABLET | Freq: Every day | ORAL | 0 refills | Status: DC

## 2018-12-12 MED ORDER — LIDOCAINE VISCOUS HCL 2 % MT SOLN
15.0000 mL | Freq: Once | OROMUCOSAL | Status: AC
  Administered 2018-12-12: 15 mL via ORAL
  Filled 2018-12-12: qty 15

## 2018-12-12 MED ORDER — SACUBITRIL-VALSARTAN 97-103 MG PO TABS
1.0000 | ORAL_TABLET | Freq: Once | ORAL | Status: DC
  Filled 2018-12-12: qty 1

## 2018-12-12 MED ORDER — SODIUM CHLORIDE 0.9% FLUSH: 3.0000 mL | Freq: Once | INTRAVENOUS | Status: DC

## 2018-12-12 MED ORDER — CARVEDILOL 12.5 MG PO TABS
25.0000 mg | ORAL_TABLET | Freq: Once | ORAL | Status: AC
  Administered 2018-12-12: 25 mg via ORAL
  Filled 2018-12-12: qty 2

## 2018-12-12 MED ORDER — ALUM & MAG HYDROXIDE-SIMETH 200-200-20 MG/5ML PO SUSP
30.0000 mL | Freq: Once | ORAL | Status: AC
  Administered 2018-12-12: 30 mL via ORAL
  Filled 2018-12-12: qty 30

## 2018-12-12 MED ORDER — HYDROMORPHONE HCL 1 MG/ML IJ SOLN
1.0000 mg | Freq: Once | INTRAMUSCULAR | Status: AC
  Administered 2018-12-12: 08:00:00 1 mg via INTRAVENOUS
  Filled 2018-12-12: qty 1

## 2018-12-12 NOTE — ED Triage Notes (Signed)
Patient with epigastric pain, states that it has been hurting for three days.  She has had some nausea and vomiting with it.  The pain is consistent, sharp in nature.  It does get better after vomiting.

## 2018-12-12 NOTE — ED Provider Notes (Signed)
[facility]   CSN: [id number] Arrival date & time: 12/12/18  [id number]    History   Chief Complaint Chief Complaint  Patient presents with   Abdominal Pain    HPI Sydney Jackson is a 48 y.o. female.     HPI   48 yo F with PMHx HFrEF, HTN, here with abdominal pain. Pt reports gradual onset of persistent aching, stabbing, epigastric/RUQ pain 3 days ago. Since then, the pain has been mostly constant, and unrelenting limiting here ability to sleep. She's had associated nausea but just began vomiting yesterday, and has subsequently been unable to keep anything down - including her meds. No CP, SOB. Pain is epigastric and RUQ, no radiation. Her vomit has been NB, NB. She does have slight improvement in pain after vomiting, otherwise denies any alleviating factors. Pain and nausea are made worse w/ eating. No other complaints. No fever or chills. No diarrhea or constipation. No suspicious food intake.   Past Medical History:  Diagnosis Date   Depression    Herpes simplex disease    History of syncope    s/p  heart valve repair  1995   Hypertension    NSVT (nonsustained ventricular tachycardia) (HCC)    Palpitations    Right ACL tear    Right knee meniscal tear    S/P heart valve repair    1995--  pt does not remember which one    Patient Active Problem List   Diagnosis Date Noted   NSVT (nonsustained ventricular tachycardia) (HCC) 11/05/2018   Influenza B 11/05/2018   Chronic systolic (congestive) heart failure (HCC) 10/14/2018   S/P ACL reconstruction 01/21/2014   Essential hypertension 07/14/2012   Generalized anxiety disorder 07/14/2012   Depression 07/14/2012   Retinal detachment 07/14/2012    Past Surgical History:  Procedure Laterality Date   ABLATION     CARDIAC VALVE SURGERY  1995 (approx.  age 48's)   pt does not remember which valve   KNEE ARTHROSCOPY Right 01/21/2014   Procedure: RIGHT   KNEE ARTHROSCOPY WITH DEBRIDEMENT PARTIAL MENISECTOMY AND AUTOGRAPH ACL RECONSTRUCTION;  Surgeon: Eugenia Mcalpineobert Collins, MD;  [facility];  Service: Orthopedics;  Laterality: Right;   KNEE ARTHROSCOPY N/A 01/2014   KNEE REPAIR EXTENSOR MECHANISM  2015   RETINAL DETACHMENT REPAIR W/ SCLERAL BUCKLE LE Left 08-27-2006     OB History   No obstetric history on file.      Home Medications    Prior to Admission medications   Medication Sig Start Date End Date Taking? Authorizing Provider  albuterol (PROVENTIL) (2.5 MG/3ML) 0.083% nebulizer solution Take 3 mLs (2.5 mg total) by nebulization every 6 (six) hours as needed for wheezing or shortness of breath. 03/26/16   Kirichenko, Tatyana, PA-C  carvedilol (COREG) 25 MG tablet Take 1 tablet (25 mg total) by mouth 2 (two) times daily. 11/23/18 02/21/19  Yates DecampGanji, Jay, MD  HYDROcodone-acetaminophen (NORCO/VICODIN) 5-325 MG tablet Take 1-2 tablets by mouth every 6 (six) hours as needed for moderate pain or severe pain. 12/12/18   Shaune PollackIsaacs, Jaya Lapka, MD  nitroGLYCERIN (NITROSTAT) 0.4 MG SL tablet Place 1 tablet (0.4 mg total) under the tongue every 5 (five) minutes as needed for chest pain (CP or SOB). 10/15/18   Patwardhan, Anabel BeneManish J, MD  pantoprazole (PROTONIX) 40 MG tablet Take 1 tablet (40 mg total) by mouth daily for 14 days. 12/12/18 12/26/18  Shaune PollackIsaacs, Queenie Aufiero, MD  promethazine (PHENERGAN) 25 MG tablet Take 1 tablet (  25 mg total) by mouth every 8 (eight) hours as needed for nausea or vomiting (can also take for migraine headache). 12/12/18   Shaune Pollack, MD  sacubitril-valsartan (ENTRESTO) 97-103 MG Take 1 tablet by mouth 2 (two) times daily. 11/23/18   Yates Decamp, MD  sertraline (ZOLOFT) 100 MG tablet Take 100 mg by mouth daily.    [provider]  spironolactone (ALDACTONE) 50 MG tablet Take 1 tablet (50 mg total) by mouth daily. 12/08/18   Yates Decamp, MD  sucralfate (CARAFATE) 1 g tablet Take 1 tablet (1 g total) by mouth 4 (four)  times daily -  with meals and at bedtime for 7 days. 12/12/18 12/19/18  Shaune Pollack, MD    Family History Family History  Problem Relation Age of Onset   Hypertension Mother    Diabetes Father    Hypertension Brother    Congestive Heart Failure Sister     Social History Social History   Tobacco Use   Smoking status: Former Smoker    Types: Cigarettes   Smokeless tobacco: Never Used   Tobacco comment: quit 2010  Substance Use Topics   Alcohol use: Yes    Comment: Glass or two of wine on occasion, past history of heavy use    Drug use: No    Comment: hx   THC use, ended 10+ years ago-- (01-17-2014   DENIES     Allergies   Patient has no known allergies.   Review of Systems Review of Systems  Constitutional: Positive for fatigue. Negative for chills and fever.  HENT: Negative for congestion, rhinorrhea and sore throat.   Eyes: Negative for visual disturbance.  Respiratory: Negative for cough, shortness of breath and wheezing.   Cardiovascular: Negative for chest pain and leg swelling.  Gastrointestinal: Positive for abdominal pain, nausea and vomiting. Negative for diarrhea.  Genitourinary: Negative for dysuria, flank pain, vaginal bleeding and vaginal discharge.  Musculoskeletal: Negative for neck pain.  Skin: Negative for rash.  Allergic/Immunologic: Negative for immunocompromised state.  Neurological: Negative for syncope and headaches.  Hematological: Does not bruise/bleed easily.     Physical Exam Updated Vital Signs BP 102/73    Pulse 69    Temp 98.2 F (36.8 C) (Oral)    Resp 12    LMP 11/30/2018 (Approximate)    SpO2 98%   Physical Exam Vitals signs and nursing note reviewed.  Constitutional:      General: She is not in acute distress.    Appearance: She is well-developed.  HENT:     Head: Normocephalic and atraumatic.  Eyes:     Conjunctiva/sclera: Conjunctivae normal.  Neck:     Musculoskeletal: Neck supple.  Cardiovascular:     Rate  and Rhythm: Normal rate and regular rhythm.     Heart sounds: Normal heart sounds. No murmur. No friction rub.  Pulmonary:     Effort: Pulmonary effort is normal. No respiratory distress.     Breath sounds: Normal breath sounds. No wheezing or rales.  Abdominal:     General: There is no distension.     Palpations: Abdomen is soft.     Tenderness: There is abdominal tenderness (TTP in epigastric and RUQ, but no overt Murphy's sign, no reboudn) in the right upper quadrant and epigastric area. There is guarding. There is no rebound. Negative signs include Murphy's sign.  Skin:    General: Skin is warm.     Capillary Refill: Capillary refill takes less than 2 seconds.  Neurological:  Mental Status: She is alert and oriented to person, place, and time.     Motor: No abnormal muscle tone.      ED Treatments / Results  Labs (all labs ordered are listed, but only abnormal results are displayed) Labs Reviewed  COMPREHENSIVE METABOLIC PANEL - Abnormal; Notable for the following components:      Result Value   Glucose, Bld 113 (*)    AST 14 (*)    All other components within normal limits  CBC - Abnormal; Notable for the following components:   MCH 24.8 (*)    RDW 18.4 (*)    Platelets 450 (*)    All other components within normal limits  URINALYSIS, ROUTINE W REFLEX MICROSCOPIC - Abnormal; Notable for the following components:   APPearance HAZY (*)    Ketones, ur 5 (*)    Protein, ur 30 (*)    Leukocytes,Ua SMALL (*)    All other components within normal limits  LIPASE, BLOOD  TROPONIN I  I-STAT BETA HCG BLOOD, ED ([facility], [facility], [facility] ONLY)    EKG EKG Interpretation  Date/Time:  Saturday Dec 12 2018 07:38:12 EDT Ventricular Rate:  82 PR Interval:    QRS Duration: 98 QT Interval:  395 QTC Calculation: 462 R Axis:   67 Text Interpretation:  Sinus rhythm Consider anterior infarct No significant change since last tracing Confirmed by Shaune Pollack [phone]) on 12/12/2018 7:55:26  AM   Radiology Ct Abdomen Pelvis W Contrast  Result Date: 12/12/2018 CLINICAL DATA:  48 year old with nausea and vomiting. Epigastric pain. EXAM: CT ABDOMEN AND PELVIS WITH CONTRAST TECHNIQUE: Multidetector CT imaging of the abdomen and pelvis was performed using the standard protocol following bolus administration of intravenous contrast. CONTRAST:  OMNIPAQUE IOHEXOL 300 MG/ML  SOLN COMPARISON:  Abdominal ultrasound 03/26/2016 FINDINGS: Lower chest: Lung bases are clear. Hepatobiliary: Normal appearance of the liver, gallbladder and portal venous system. No biliary dilatation Pancreas: Mild soft tissue stranding just anterior to the pancreatic head region on sequence 3, image 31. Mild heterogeneity in the pancreatic neck region on sequence 3, image 27. No pancreatic duct dilatation. Spleen: Normal in size without focal abnormality. Adrenals/Urinary Tract: Minimal fullness in the right adrenal gland is nonspecific. No significant adrenal abnormality. Urinary bladder is unremarkable. Negative for hydronephrosis. No suspicious renal lesion. Stomach/Bowel: Mild wall thickening near the pylorus and duodenal bulb but no significant stranding in this area. Inflammatory changes along the descending duodenum and pancreatic head region. No evidence for bowel dilatation. Normal appendix. Normal appearance of the terminal ileum. Right side of the transverse colon is adjacent to the mild inflammatory changes in the upper abdomen. There may be a small colonic diverticulum in this area but the nidus of the inflammation does not appear to be coming from the colon. Vascular/Lymphatic: No significant vascular findings are present. No enlarged abdominal or pelvic lymph nodes. Reproductive: Uterus and bilateral adnexa are unremarkable. Other: Negative for ascites.  Negative for free air. Musculoskeletal: Degenerative facet disease in the lower lumbar spine particularly at L4-L5. IMPRESSION: Mild inflammatory changes in the  central upper abdomen. Inflammation is centered around the pancreatic head and duodenum. Differential diagnosis includes pancreatitis and/or duodenitis. No focal fluid collections. No evidence for free air. Electronically Signed   By: Richarda Overlie M.D.   On: 12/12/2018 09:17    Procedures Procedures (including critical care time)  Medications Ordered in ED Medications  sodium chloride flush (NS) 0.9 % injection 3 mL (has no administration in time range)  pantoprazole (PROTONIX) injection 40 mg (has no administration in time range)  HYDROmorphone (DILAUDID) injection 1 mg (1 mg Intravenous Given 12/12/18 0739)  ondansetron (ZOFRAN) injection 4 mg (4 mg Intravenous Given 12/12/18 0725)  alum & mag hydroxide-simeth (MAALOX/MYLANTA) 200-200-20 MG/5ML suspension 30 mL (30 mLs Oral Given 12/12/18 0748)    And  lidocaine (XYLOCAINE) 2 % viscous mouth solution 15 mL (15 mLs Oral Given 12/12/18 0748)  carvedilol (COREG) tablet 25 mg (25 mg Oral Given 12/12/18 0738)  0.9 %  sodium chloride infusion ( Intravenous New Bag/Given 12/12/18 0832)  iohexol (OMNIPAQUE) 300 MG/ML solution 100 mL (100 mLs Intravenous Contrast Given 12/12/18 0844)     Initial Impression / Assessment and Plan / ED Course  I have reviewed the triage vital signs and the nursing notes.  Pertinent labs & imaging results that were available during my care of the patient were reviewed by me and considered in my medical decision making (see chart for details).  Clinical Course as of Dec 11 953  Sat Dec 12, 2018  0720 48 yo F h   [CI]  59 48 yo F here w/ epigastric and RUQ pain. DDx includes gastritis, PUD, pancreatitis, cholecystitis, biliary colic. No CP, SOB, or sx to suggest referred pain from cardiac source. She is hypertensive but has vomited up her meds. Will give analgesia, antiemetics, and f/u labs. Will likely need U/S vs CT depending on labs.  Pulse Rate: 96 [CI]  0724 Normal WBC, Hgb.  CBC(!) [CI]  0725 Neg UPT  I-Stat beta hCG  blood, ED [CI]  0729 Has not taken meds this AM - will give dose of coreg, hold on entresto pending renal function  BP(!): 182/113 [CI]  0803 CMP reassuring, normal LFTs, Bili. Lipase wnl. Trop pending. F/u CT.   [CI]  0819 BP, HR improved w/ pain control and coreg.   [CI]  [id number] Reassuring with pain constant x 3 days, doubt ACS.  Troponin I - ONCE - STAT [CI]  0838 Mild dehydration, but no signs of UTI or stone. Gentle fluids running in setting of CHF.  Urinalysis, Routine w reflex microscopic(!) [CI]  [id number] CT scan reviewed by myself and Radiology. Suspect duodenitis/distal gastritis. Lipase normal, doubt pancreatitis. Pt feeling better in ED, tolerating PO. She now admits to taking ibuprofen 800s for her HAs. Will have her d/c NSAIDs, start on antacids, refer for PCP and ultiamtely GI prn.   CT ABDOMEN PELVIS W CONTRAST [CI]    Clinical Course User Index [CI] Shaune Pollack, MD        Final Clinical Impressions(s) / ED Diagnoses   Final diagnoses:  Duodenitis    ED Discharge Orders         Ordered    pantoprazole (PROTONIX) 40 MG tablet  Daily     12/12/18 0953    sucralfate (CARAFATE) 1 g tablet  3 times daily with meals & bedtime     12/12/18 0953    HYDROcodone-acetaminophen (NORCO/VICODIN) 5-325 MG tablet  Every 6 hours PRN     12/12/18 0953    promethazine (PHENERGAN) 25 MG tablet  Every 8 hours PRN     12/12/18 0953           Shaune Pollack, MD 12/12/18 [phone]

## 2018-12-12 NOTE — ED Notes (Signed)
Pt given sandwich and saltine crackers per request.

## 2018-12-12 NOTE — ED Notes (Signed)
Patient transported to CT 

## 2018-12-12 NOTE — Discharge Instructions (Signed)
Follow-up with Dr. Cliffton Asters in 1-2 weeks. If symptoms do not improve or return, you will likely need referral to a GI (stomach) specialist  Do not take ANY ibuprofen, aspirin, advil, naproxen, or other NSAID medications. Avoid alcohol, spicy foods, or foods high in acid such as tomato sauce or fruit juices.  Take the meds as prescribed.  The Norco can be used for stomach pain and headache, although I'd also recommend taking Phenergan for migraines and nausea.

## 2018-12-12 NOTE — ED Notes (Signed)
Patient verbalizes understanding of discharge instructions. Opportunity for questioning and answers were provided. Pt discharged from ED. 

## 2018-12-28 ENCOUNTER — Encounter: Payer: Self-pay | Admitting: Cardiology

## 2019-02-02 ENCOUNTER — Emergency Department (HOSPITAL_COMMUNITY): Payer: BC Managed Care – PPO

## 2019-02-02 ENCOUNTER — Emergency Department (HOSPITAL_COMMUNITY)
Admission: EM | Admit: 2019-02-02 | Discharge: 2019-02-03 | Disposition: A | Discharge status: Home / Self Care | Payer: BC Managed Care – PPO | Attending: Emergency Medicine | Admitting: Emergency Medicine

## 2019-02-02 ENCOUNTER — Encounter (HOSPITAL_COMMUNITY): Payer: Self-pay

## 2019-02-02 ENCOUNTER — Other Ambulatory Visit: Payer: Self-pay

## 2019-02-02 DIAGNOSIS — R05 Cough: Secondary | ICD-10-CM | POA: Diagnosis not present

## 2019-02-02 DIAGNOSIS — J40 Bronchitis, not specified as acute or chronic: Secondary | ICD-10-CM | POA: Diagnosis not present

## 2019-02-02 DIAGNOSIS — Z79899 Other long term (current) drug therapy: Secondary | ICD-10-CM | POA: Insufficient documentation

## 2019-02-02 DIAGNOSIS — Z20828 Contact with and (suspected) exposure to other viral communicable diseases: Secondary | ICD-10-CM | POA: Insufficient documentation

## 2019-02-02 DIAGNOSIS — I110 Hypertensive heart disease with heart failure: Secondary | ICD-10-CM | POA: Insufficient documentation

## 2019-02-02 DIAGNOSIS — I5022 Chronic systolic (congestive) heart failure: Secondary | ICD-10-CM | POA: Diagnosis not present

## 2019-02-02 DIAGNOSIS — Z952 Presence of prosthetic heart valve: Secondary | ICD-10-CM | POA: Diagnosis not present

## 2019-02-02 DIAGNOSIS — Z87891 Personal history of nicotine dependence: Secondary | ICD-10-CM | POA: Diagnosis not present

## 2019-02-02 LAB — CBC
HCT: 37.6 % (ref 36.0–46.0)
Hemoglobin: 11.7 g/dL — ABNORMAL LOW (ref 12.0–15.0)
MCH: 26.4 pg (ref 26.0–34.0)
MCHC: 31.1 g/dL (ref 30.0–36.0)
MCV: 84.7 fL (ref 80.0–100.0)
Platelets: 384 10*3/uL (ref 150–400)
RBC: 4.44 MIL/uL (ref 3.87–5.11)
RDW: 16.2 % — ABNORMAL HIGH (ref 11.5–15.5)
WBC: 6.2 10*3/uL (ref 4.0–10.5)
nRBC: 0 % (ref 0.0–0.2)

## 2019-02-02 LAB — I-STAT BETA HCG BLOOD, ED (NOT ORDERABLE): I-stat hCG, quantitative: <5 m[IU]/mL (ref <5)

## 2019-02-02 LAB — BASIC METABOLIC PANEL
Anion gap: 11 (ref 5–15)
BUN: 20 mg/dL (ref 6–20)
CO2: 22 mmol/L (ref 22–32)
Calcium: 8.4 mg/dL — ABNORMAL LOW (ref 8.9–10.3)
Chloride: 105 mmol/L (ref 98–111)
Creatinine, Ser: 0.99 mg/dL (ref 0.44–1.00)
GFR calc Af Amer: >60 mL/min (ref >60)
GFR calc non Af Amer: >60 mL/min (ref >60)
Glucose, Bld: 98 mg/dL (ref 70–99)
Potassium: 3.7 mmol/L (ref 3.5–5.1)
Sodium: 138 mmol/L (ref 135–145)

## 2019-02-02 LAB — TROPONIN I (HIGH SENSITIVITY): Troponin I (High Sensitivity): 4 ng/L (ref <18)

## 2019-02-02 NOTE — ED Triage Notes (Signed)
Pt arrived stating she has been coughing for about a month, pt has hx of CHF. Since last Thursday has been having chest tightness that worsens when she lays down on her back. Pt in no distress at this time.

## 2019-02-03 MED ORDER — ALBUTEROL SULFATE HFA 108 (90 BASE) MCG/ACT IN AERS
8.0000 | INHALATION_SPRAY | RESPIRATORY_TRACT | Status: AC
  Administered 2019-02-03: 8 via RESPIRATORY_TRACT
  Filled 2019-02-03: qty 6.7

## 2019-02-03 MED ORDER — PREDNISONE 20 MG PO TABS
60.0000 mg | ORAL_TABLET | Freq: Once | ORAL | Status: AC
  Administered 2019-02-03: 60 mg via ORAL
  Filled 2019-02-03: qty 3

## 2019-02-03 MED ORDER — DOXYCYCLINE HYCLATE 100 MG PO CAPS: 100.0000 mg | ORAL_CAPSULE | Freq: Two times a day (BID) | ORAL | 0 refills | Status: DC

## 2019-02-03 MED ORDER — PREDNISONE 20 MG PO TABS: 40.0000 mg | ORAL_TABLET | Freq: Every day | ORAL | 0 refills | Status: DC

## 2019-02-03 NOTE — ED Provider Notes (Signed)
[facility] DEPT Provider Note   CSN: [id number] Arrival date & time: 02/02/19  1844    History   Chief Complaint Chief Complaint  Patient presents with  . Cough  . Chest Pain    HPI Sydney Jackson is a 48 y.o. female.     Patient presents to the emergency department for evaluation of cough and shortness of breath with chest tightness.  Patient reports that she has been sick for more than a month.  She was told that she had the flu approximately a month ago but her cough has been persistent.  She has been using her inhaler without much improvement.  Cough is nonproductive.  She feels tight in the chest like she cannot get a breath in and out.       Past Medical History:  Diagnosis Date  . Depression   . Herpes simplex disease   . History of syncope    s/p  heart valve repair  1995  . Hypertension   . NSVT (nonsustained ventricular tachycardia) ([place])   . Palpitations   . Right ACL tear   . Right knee meniscal tear     Patient Active Problem List   Diagnosis Date Noted  . NSVT (nonsustained ventricular tachycardia) ([place]) 11/05/2018  . Influenza B 11/05/2018  . Chronic systolic (congestive) heart failure ([place]) 10/14/2018  . S/P ACL reconstruction 01/21/2014  . Essential hypertension 07/14/2012  . Generalized anxiety disorder 07/14/2012  . Depression 07/14/2012  . Retinal detachment 07/14/2012    Past Surgical History:  Procedure Laterality Date  . ABLATION    . [place] (approx.  age 20's)   pt does not remember which valve  . KNEE ARTHROSCOPY Right 01/21/2014   Procedure: RIGHT  KNEE ARTHROSCOPY WITH DEBRIDEMENT PARTIAL MENISECTOMY AND AUTOGRAPH ACL RECONSTRUCTION;  Surgeon: Sydnee Cabal, MD;  Location: [employer];  Service: Orthopedics;  Laterality: Right;  . KNEE ARTHROSCOPY N/A 01/2014  . KNEE REPAIR EXTENSOR MECHANISM  2015  . RETINAL DETACHMENT REPAIR W/ SCLERAL BUCKLE LE Left 08-27-2006      OB History   No obstetric history on file.      Home Medications    Prior to Admission medications   Medication Sig Start Date End Date Taking? Authorizing Provider  albuterol (VENTOLIN HFA) 108 (90 Base) MCG/ACT inhaler Inhale 1 puff into the lungs every 6 (six) hours as needed for wheezing or shortness of breath.   Yes [provider]  carvedilol (COREG) 25 MG tablet Take 1 tablet (25 mg total) by mouth 2 (two) times daily. 11/23/18 02/21/19 Yes Adrian Prows, MD  nitroGLYCERIN (NITROSTAT) 0.4 MG SL tablet Place 1 tablet (0.4 mg total) under the tongue every 5 (five) minutes as needed for chest pain (CP or SOB). 10/15/18  Yes Patwardhan, Manish J, MD  sacubitril-valsartan (ENTRESTO) 97-103 MG Take 1 tablet by mouth 2 (two) times daily. 11/23/18  Yes Adrian Prows, MD  sertraline (ZOLOFT) 100 MG tablet Take 100 mg by mouth daily.   Yes [provider]  spironolactone (ALDACTONE) 50 MG tablet Take 1 tablet (50 mg total) by mouth daily. 12/08/18  Yes Adrian Prows, MD  albuterol (PROVENTIL) (2.5 MG/3ML) 0.083% nebulizer solution Take 3 mLs (2.5 mg total) by nebulization every 6 (six) hours as needed for wheezing or shortness of breath. Patient not taking: Reported on 02/03/2019 03/26/16   Jeannett Senior, PA-C  doxycycline (VIBRAMYCIN) 100 MG capsule Take 1 capsule (100 mg total) by  mouth 2 (two) times daily. 02/03/19   Gilda CreasePollina, Telina Kleckley J, MD  HYDROcodone-acetaminophen (NORCO/VICODIN) 5-325 MG tablet Take 1-2 tablets by mouth every 6 (six) hours as needed for moderate pain or severe pain. Patient not taking: Reported on 02/03/2019 12/12/18   Shaune PollackIsaacs, Cameron, MD  pantoprazole (PROTONIX) 40 MG tablet Take 1 tablet (40 mg total) by mouth daily for 14 days. Patient not taking: Reported on 02/03/2019 12/12/18 12/26/18  Shaune PollackIsaacs, Cameron, MD  predniSONE (DELTASONE) 20 MG tablet Take 2 tablets (40 mg total) by mouth daily with breakfast. 02/03/19   Legend Pecore, Canary Brimhristopher J, MD  promethazine  (PHENERGAN) 25 MG tablet Take 1 tablet (25 mg total) by mouth every 8 (eight) hours as needed for nausea or vomiting (can also take for migraine headache). Patient not taking: Reported on 02/03/2019 12/12/18   Shaune PollackIsaacs, Cameron, MD  sucralfate (CARAFATE) 1 g tablet Take 1 tablet (1 g total) by mouth 4 (four) times daily -  with meals and at bedtime for 7 days. Patient not taking: Reported on 02/03/2019 12/12/18 12/19/18  Shaune PollackIsaacs, Cameron, MD    Family History Family History  Problem Relation Age of Onset  . Hypertension Mother   . Diabetes Father   . Hypertension Brother   . Congestive Heart Failure Sister     Social History Social History   Tobacco Use  . Smoking status: Former Smoker    Types: Cigarettes  . Smokeless tobacco: Never Used  . Tobacco comment: quit 2010  Substance Use Topics  . Alcohol use: Yes    Comment: Glass or two of wine on occasion, past history of heavy use   . Drug use: No    Comment: hx   THC use, ended 10+ years ago-- (01-17-2014   DENIES     Allergies   Patient has no known allergies.   Review of Systems Review of Systems  Respiratory: Positive for cough and chest tightness.   All other systems reviewed and are negative.    Physical Exam Updated Vital Signs BP (!) 173/89   Pulse 87   Temp 99.1 F (37.3 C) (Oral)   Resp 20   Ht 5\' 5"  (1.651 m)   LMP 01/14/2019   SpO2 100%   BMI 38.27 kg/m   Physical Exam Vitals signs and nursing note reviewed.  Constitutional:      General: She is not in acute distress.    Appearance: Normal appearance. She is well-developed.  HENT:     Head: Normocephalic and atraumatic.     Right Ear: Hearing normal.     Left Ear: Hearing normal.     Nose: Nose normal.  Eyes:     Conjunctiva/sclera: Conjunctivae normal.     Pupils: Pupils are equal, round, and reactive to light.  Neck:     Musculoskeletal: Normal range of motion and neck supple.  Cardiovascular:     Rate and Rhythm: Regular rhythm.     Heart  sounds: S1 normal and S2 normal. No murmur. No friction rub. No gallop.   Pulmonary:     Effort: Pulmonary effort is normal. No respiratory distress.     Breath sounds: Normal breath sounds.  Chest:     Chest wall: No tenderness.  Abdominal:     General: Bowel sounds are normal.     Palpations: Abdomen is soft.     Tenderness: There is no abdominal tenderness. There is no guarding or rebound. Negative signs include Murphy's sign and McBurney's sign.     Hernia: No  hernia is present.  Musculoskeletal: Normal range of motion.  Skin:    General: Skin is warm and dry.     Findings: No rash.  Neurological:     Mental Status: She is alert and oriented to person, place, and time.     GCS: GCS eye subscore is 4. GCS verbal subscore is 5. GCS motor subscore is 6.     Cranial Nerves: No cranial nerve deficit.     Sensory: No sensory deficit.     Coordination: Coordination normal.  Psychiatric:        Speech: Speech normal.        Behavior: Behavior normal.        Thought Content: Thought content normal.      ED Treatments / Results  Labs (all labs ordered are listed, but only abnormal results are displayed) Labs Reviewed  BASIC METABOLIC PANEL - Abnormal; Notable for the following components:      Result Value   Calcium 8.4 (*)    All other components within normal limits  CBC - Abnormal; Notable for the following components:   Hemoglobin 11.7 (*)    RDW 16.2 (*)    All other components within normal limits  NOVEL CORONAVIRUS, NAA (HOSPITAL ORDER, SEND-OUT TO REF LAB)  TROPONIN I (HIGH SENSITIVITY)  TROPONIN I (HIGH SENSITIVITY)  I-STAT BETA HCG BLOOD, ED ([facility], [facility], [facility] ONLY)  I-STAT BETA HCG BLOOD, ED (NOT ORDERABLE)    EKG EKG Interpretation  Date/Time:  Tuesday February 02 2019 19:07:27 EDT Ventricular Rate:  91 PR Interval:    QRS Duration: 94 QT Interval:  349 QTC Calculation: 430 R Axis:   81 Text Interpretation:  Sinus rhythm Baseline wander in lead(s) V5 V6 Otherwise  within normal limits Confirmed by Gilda CreasePollina, Renee Beale J [phone]) on 02/03/2019 12:04:51 AM   Radiology Dg Chest 2 View  Result Date: 02/02/2019 CLINICAL DATA:  Chest tightness and cough EXAM: CHEST - 2 VIEW COMPARISON:  None. FINDINGS: The heart size and mediastinal contours are within normal limits. Both lungs are clear. The visualized skeletal structures are unremarkable. IMPRESSION: No active cardiopulmonary disease. Electronically Signed   By: Deatra RobinsonKevin  Herman M.D.   On: 02/02/2019 20:22    Procedures Procedures (including critical care time)  Medications Ordered in ED Medications  albuterol (VENTOLIN HFA) 108 (90 Base) MCG/ACT inhaler 8 puff (8 puffs Inhalation Given 02/03/19 0031)  predniSONE (DELTASONE) tablet 60 mg (60 mg Oral Given 02/03/19 0029)     Initial Impression / Assessment and Plan / ED Course  I have reviewed the triage vital signs and the nursing notes.  Pertinent labs & imaging results that were available during my care of the patient were reviewed by me and considered in my medical decision making (see chart for details).        Patient presents to the emergency department for evaluation of cough and shortness of breath.  Patient is concerned because she has a history of congestive heart failure.  She does not, however, appear to have any decompensation from a standpoint of her CHF.  She does not have any swelling on exam, no gallop, no JVD.  Lungs are clear.  Chest x-ray does not show any edema.  Her BNP is low.  She also has a history of chronic lung disease and this is likely causing her symptoms.  She has had this cough for a month.  She reports COVID testing at the beginning that was negative.  This is likely a bronchitis  with persistent bronchospasm.  Repeat [place] testing sent.  She will be placed on steroid and continue albuterol.  Final Clinical Impressions(s) / ED Diagnoses   Final diagnoses:  Bronchitis    ED Discharge Orders         Ordered     predniSONE (DELTASONE) 20 MG tablet  Daily with breakfast     02/03/19 0154    doxycycline (VIBRAMYCIN) 100 MG capsule  2 times daily     02/03/19 0154           Gilda Crease, MD 02/03/19 [phone]

## 2019-02-03 NOTE — ED Notes (Signed)
Pt provided with warm blanket. 

## 2019-02-04 LAB — NOVEL CORONAVIRUS, NAA (HOSP ORDER, SEND-OUT TO REF LAB; TAT 18-24 HRS): SARS-CoV-2, NAA: NOT DETECTED

## 2019-02-09 ENCOUNTER — Other Ambulatory Visit: Payer: Managed Care, Other (non HMO)

## 2019-02-16 ENCOUNTER — Institutional Professional Consult (permissible substitution): Payer: Managed Care, Other (non HMO) | Admitting: Neurology

## 2019-02-16 ENCOUNTER — Encounter: Payer: Self-pay | Admitting: Neurology

## 2019-02-16 ENCOUNTER — Telehealth: Payer: Self-pay

## 2019-02-16 NOTE — Telephone Encounter (Signed)
Pt did not show for their appt with Dr. Athar today.  

## 2019-02-18 ENCOUNTER — Ambulatory Visit: Payer: BLUE CROSS/BLUE SHIELD | Admitting: Cardiology

## 2019-03-19 ENCOUNTER — Other Ambulatory Visit: Payer: Self-pay

## 2019-03-19 ENCOUNTER — Ambulatory Visit (INDEPENDENT_AMBULATORY_CARE_PROVIDER_SITE_OTHER): Payer: Managed Care, Other (non HMO)

## 2019-03-19 ENCOUNTER — Encounter

## 2019-03-19 DIAGNOSIS — I5022 Chronic systolic (congestive) heart failure: Secondary | ICD-10-CM | POA: Diagnosis not present

## 2019-03-29 ENCOUNTER — Ambulatory Visit: Payer: Managed Care, Other (non HMO) | Admitting: Cardiology

## 2019-04-08 ENCOUNTER — Encounter: Payer: Self-pay | Admitting: Cardiology

## 2019-04-08 NOTE — Progress Notes (Deleted)
Subjective:  Primary Physician/Referring:  Laurann Montana, MD  Patient ID: Sydney Jackson, female    DOB: 10-27-1970, 48 y.o.   MRN: [id number]  No chief complaint on file.   HPI: Sydney Jackson  is a 48 y.o. female  with hypertension, history of SVT status post ablation at age 45-25 years in [place] [employer], [place], new diagnosis of cardiomyopathy diagnosed in Jan 2020 with EF 25-30%, grade 2 diastolic dysfunction, grade 2 mitral regurgitation, nonsustained VT seen on event monitor with normal perfusion and low EF by nuclear stress in March 2020. She is on Entresto to maximum dose along with carvedilol to maximum dose.   She has not had any worsening shortness of breath, orthopnea, PND, leg edema. Underwent echocardiogram and presents for a 3 month f/u.    Past Medical History:  Diagnosis Date   Chronic systolic (congestive) heart failure (HCC) 10/14/2018   Echocardiogram 03/19/2019: Low normal LV systolic function with visual EF 50-55%.   Depression    Herpes simplex disease    History of syncope    s/p  heart valve repair  1995   Hypertension    NSVT (nonsustained ventricular tachycardia) (HCC)    Palpitations    Right ACL tear    Right knee meniscal tear     Past Surgical History:  Procedure Laterality Date   ABLATION     CARDIAC VALVE SURGERY  1995 (approx.  age 5's)   pt does not remember which valve   KNEE ARTHROSCOPY Right 01/21/2014   Procedure: RIGHT  KNEE ARTHROSCOPY WITH DEBRIDEMENT PARTIAL MENISECTOMY AND AUTOGRAPH ACL RECONSTRUCTION;  Surgeon: Eugenia Mcalpine, MD;  Location: [facility];  Service: Orthopedics;  Laterality: Right;   KNEE ARTHROSCOPY N/A 01/2014   KNEE REPAIR EXTENSOR MECHANISM  2015   RETINAL DETACHMENT REPAIR W/ SCLERAL BUCKLE LE Left 08-27-2006    Social History   Socioeconomic History   Marital status: Single    Spouse name: Not on file   Number of children: 4   Years of education: Not on file   Highest  education level: Not on file  Occupational History   Occupation: customer service    Employer: [employer]  Social Needs   Financial resource strain: Not on file   Food insecurity    Worry: Not on file    Inability: Not on file   Transportation needs    Medical: Not on file    Non-medical: Not on file  Tobacco Use   Smoking status: Former Smoker    Types: Cigarettes   Smokeless tobacco: Never Used   Tobacco comment: quit 2010  Substance and Sexual Activity   Alcohol use: Yes    Comment: Glass or two of wine on occasion, past history of heavy use    Drug use: No    Comment: hx   THC use, ended 10+ years ago-- (01-17-2014   DENIES   Sexual activity: Not Currently    Partners: Male  Lifestyle   Physical activity    Days per week: Not on file    Minutes per session: Not on file   Stress: Not on file  Relationships   Social connections    Talks on phone: Not on file    Gets together: Not on file    Attends religious service: Not on file    Active member of club or organization: Not on file    Attends meetings of clubs or organizations: Not on file  Relationship status: Not on file   Intimate partner violence    Fear of current or ex partner: Not on file    Emotionally abused: Not on file    Physically abused: Not on file    Forced sexual activity: Not on file  Other Topics Concern   Not on file  Social History Narrative   ** Merged History Encounter **       Sydney Jackson was born and grew up in [place]. She reports that her childhood was "okay." Her biological father left when she was 10 or 49 years of age. She has 7 half-siblings by her father, and one half-sibling by her mother. She graduated high school and attended one year of college. She has never been married. She has 4 children, a 55 year old son, a 31 year old son, a 3 year old daughter, and her 38 year old daughter. Her 8 year old and 58 year old live with her now. 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 She denies any legal problems. She affiliates as a Financial trader. She reports that she has one friend who she uses for social support.    Current Outpatient Medications on File Prior to Visit  Medication Sig Dispense Refill   albuterol (PROVENTIL) (2.5 MG/3ML) 0.083% nebulizer solution Take 3 mLs (2.5 mg total) by nebulization every 6 (six) hours as needed for wheezing or shortness of breath. (Patient not taking: Reported on 02/03/2019) 75 mL 12   albuterol (VENTOLIN HFA) 108 (90 Base) MCG/ACT inhaler Inhale 1 puff into the lungs every 6 (six) hours as needed for wheezing or shortness of breath.     carvedilol (COREG) 25 MG tablet Take 1 tablet (25 mg total) by mouth 2 (two) times daily. 180 tablet 1   doxycycline (VIBRAMYCIN) 100 MG capsule Take 1 capsule (100 mg total) by mouth 2 (two) times daily. 20 capsule 0   HYDROcodone-acetaminophen (NORCO/VICODIN) 5-325 MG tablet Take 1-2 tablets by mouth every 6 (six) hours as needed for moderate pain or severe pain. (Patient not taking: Reported on 02/03/2019) 12 tablet 0   nitroGLYCERIN (NITROSTAT) 0.4 MG SL tablet Place 1 tablet (0.4 mg total) under the tongue every 5 (five) minutes as needed for chest pain (CP or SOB). 30 tablet 0   pantoprazole (PROTONIX) 40 MG tablet Take 1 tablet (40 mg total) by mouth daily for 14 days. (Patient not taking: Reported on 02/03/2019) 14 tablet 0   predniSONE (DELTASONE) 20 MG tablet Take 2 tablets (40 mg total) by mouth daily with breakfast. 10 tablet 0   promethazine (PHENERGAN) 25 MG tablet Take 1 tablet (25 mg total) by mouth every 8 (eight) hours as needed for nausea or vomiting (can also take for migraine headache). (Patient not taking: Reported on 02/03/2019) 20 tablet 0   sacubitril-valsartan (ENTRESTO) 97-103 MG Take 1 tablet by mouth 2 (two) times daily. 180 tablet 1   sertraline (ZOLOFT) 100 MG tablet Take  100 mg by mouth daily.     spironolactone (ALDACTONE) 50 MG tablet Take 1 tablet (50 mg total) by mouth daily. 30 tablet 3   sucralfate (CARAFATE) 1 g tablet Take 1 tablet (1 g total) by mouth 4 (four) times daily -  with meals and at bedtime for 7 days. (Patient not taking: Reported on 02/03/2019) 28 tablet 0   No current facility-administered medications on file prior to visit.     Review of Systems  Constitution: Positive for malaise/fatigue (and day time somnolence). Negative for  chills, decreased appetite and weight gain.  Cardiovascular: Positive for dyspnea on exertion (mild and stable). Negative for leg swelling, orthopnea and syncope.  Endocrine: Negative for cold intolerance.  Hematologic/Lymphatic: Does not bruise/bleed easily.  Musculoskeletal: Negative for joint swelling.  Gastrointestinal: Negative for abdominal pain, anorexia and change in bowel habit.  Neurological: Negative for headaches and light-headedness.  Psychiatric/Behavioral: Negative for depression and substance abuse.  All other systems reviewed and are negative.     Objective:  There were no vitals taken for this visit. There is no height or weight on file to calculate BMI.   Physical Exam  Constitutional: She is oriented to person, place, and time. She appears well-developed.  Obese  Neck: Neck supple.  Pulmonary/Chest: Effort normal. No respiratory distress.  Neurological: She is alert and oriented to person, place, and time.   Radiology: No results found.  Laboratory Examination: GFR calc Af Amer 10/24/2018 >60 mL/min >60     CMP Latest Ref Rng & Units 02/02/2019 12/12/2018 10/24/2018  Glucose 70 - 99 mg/dL 98 960(A113(H) 95  BUN 6 - 20 mg/dL 20 6 10   Creatinine 0.44 - 1.00 mg/dL 5.400.99 9.810.92 1.91(Y1.22(H)  Sodium 135 - 145 mmol/L 138 137 134(L)  Potassium 3.5 - 5.1 mmol/L 3.7 3.6 4.4  Chloride 98 - 111 mmol/L 105 99 101  CO2 22 - 32 mmol/L 22 25 22   Calcium 8.9 - 10.3 mg/dL 7.8(G8.4(L) 9.2 9.5(A8.6(L)  Total Protein  6.5 - 8.1 g/dL - 7.3 7.2  Total Bilirubin 0.3 - 1.2 mg/dL - 0.5 0.4  Alkaline Phos 38 - 126 U/L - 62 47  AST 15 - 41 U/L - 14(L) 18  ALT 0 - 44 U/L - 12 16   CBC Latest Ref Rng & Units 02/02/2019 12/12/2018 10/24/2018  WBC 4.0 - 10.5 K/uL 6.2 7.2 4.3  Hemoglobin 12.0 - 15.0 g/dL 11.7(L) 12.0 11.6(L)  Hematocrit 36.0 - 46.0 % 37.6 38.9 39.4  Platelets 150 - 400 K/uL 384 450(H) 334    Recent Labs    10/15/18 1342  TSH 0.861   Cardiac studies:   Echocardiogram 09/01/2018: Left ventricle cavity is normal in size. Moderate asymmetric hypertrophy of the left ventricle, with posterior wall measuring 1.6 cm. Severe decrease in global wall motion. Visual LVEF 25-30%. Grade 2 diastolic dysfunction with elevated left atrial pressure.  Calculated EF 30%. Left atrial cavity is moderately dilated. 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 Moderate (Grade II) mitral regurgitation. Inadequate TR jet to estimate PA systolic pressure. Normal right atrial pressure.  Lexiscan Sestamibi stress test 10/19/2018: 1. Lexiscan stress test was performed. Exercise capacity was not assessed. Resting blood pressure was 170/100 mmHg and peak effect blood pressure was 160/90 mmHg. Stress EKG is non diagnostic for ischemia as it is a pharmacologic stress. In addition, the stress electrocardiogram showed sinus tachycardia, normal stress conduction, no stress arrhythmias and nonspecific ST-T changes.   2. The overall quality of the study is good. There is no evidence of abnormal lung activity. Stress and rest SPECT images demonstrate homogeneous tracer distribution throughout the myocardium. LV cavity is dilated on both rest and stress images. Gated SPECT imaging reveals global decrease in myocardial thickening and wall motion. The left ventricular ejection fraction was reduced at 29%.   3. High risk study due to reduced LVEF. No evidence of ischemia/ infarction seen.  Assessment:      ICD-10-CM   1. Non-ischemic  cardiomyopathy (HCC)  I42.8   2. Essential hypertension  I10   3. Class  2 severe obesity due to excess calories with serious comorbidity and body mass index (BMI) of 38.0 to 38.9 in adult [facility](HCC)  E66.01    Z68.38     EKG 10/14/2018: Sinus tachycardia 100 bpm. Left atrial enlargmeent. No ischemic changes.   Recommendations:    Ms. 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 She is on Entresto to maximum dose along with carvedilol to maximum dose.   Patient is presently doing well and does not appear to be in acute distress and does not appear to be in florid congestive heart failure by physical exam, her EF has now normalized. 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   She is also on Aldactone 50 mg daily for CHF and hypertension. She has had history of NSVT in the past but there is no syncope or dizziness, also now her EF has improved.  Suspect hypertensive heart disease to be etiology for cardiomyopathy along with obesity vs viral cardiomyopathy. She was referred for sleep study, she did not keep the appointment due to COVID.  Yates DecampJay Hazyl Marseille, MD, [facility] 04/08/2019, 10:19 PM [facility] Pager: [phone] Office: [phone] If no answer Cell [phone]

## 2019-04-09 ENCOUNTER — Ambulatory Visit: Payer: BC Managed Care – PPO | Admitting: Cardiology

## 2019-04-12 ENCOUNTER — Ambulatory Visit (INDEPENDENT_AMBULATORY_CARE_PROVIDER_SITE_OTHER): Payer: Managed Care, Other (non HMO) | Admitting: Cardiology

## 2019-04-12 ENCOUNTER — Other Ambulatory Visit: Payer: Self-pay

## 2019-04-12 ENCOUNTER — Encounter: Payer: Self-pay | Admitting: Cardiology

## 2019-04-12 VITALS — BP 140/93 | HR 96 | Temp 97.3°F | Ht 64.0 in | Wt 236.0 lb

## 2019-04-12 DIAGNOSIS — I10 Essential (primary) hypertension: Secondary | ICD-10-CM

## 2019-04-12 DIAGNOSIS — I5022 Chronic systolic (congestive) heart failure: Secondary | ICD-10-CM

## 2019-04-12 DIAGNOSIS — I428 Other cardiomyopathies: Secondary | ICD-10-CM

## 2019-04-12 DIAGNOSIS — Z6838 Body mass index (BMI) 38.0-38.9, adult: Secondary | ICD-10-CM

## 2019-04-12 MED ORDER — CARVEDILOL 25 MG PO TABS: 37.5000 mg | ORAL_TABLET | Freq: Two times a day (BID) | ORAL | 3 refills | Status: DC

## 2019-04-12 NOTE — Progress Notes (Signed)
Subjective:  Primary Physician/Referring:  Harlan Stains, MD  Patient ID: Sydney Jackson, female    DOB: 1971-03-14, 48 y.o.   MRN: [id number]  Chief Complaint  Patient presents with  . Congestive Heart Failure  . Results    echo  . Follow-up    60mo  . Hypertension    HPI: [name]  is a 48 y.o. female  with hypertension, history of SVT status post ablation at age 30-25 years in [place], [place], new diagnosis of cardiomyopathy diagnosed in Jan 2020 with EF 40-98%, grade 2 diastolic dysfunction, grade 2 mitral regurgitation, nonsustained VT seen on event monitor with normal perfusion with low EF by nuclear stress in March 2020. She is on Entresto to maximum dose along with carvedilol to maximum dose.   She has not had any worsening shortness of breath, orthopnea, PND, leg edema. Underwent echocardiogram and presents for a 3 month f/u.  She has had occasional chest pain occasionally and states it feels like something is poking her. No exertional component. She has pain also when she is laying down.  Has had 3-4 episodes in the past 1-2 weeks.   Past Medical History:  Diagnosis Date  . Chronic systolic (congestive) heart failure ([place]) 10/14/2018   Echocardiogram 03/19/2019: Low normal LV systolic function with visual EF 50-55%.  . Depression   . Herpes simplex disease   . History of syncope    s/p  heart valve repair  1995  . Hypertension   . NSVT (nonsustained ventricular tachycardia) ([place])   . Palpitations   . Right ACL tear   . Right knee meniscal tear     Past Surgical History:  Procedure Laterality Date  . ABLATION     SVT at age 62 years  . KNEE ARTHROSCOPY Right 01/21/2014   Procedure: RIGHT  KNEE ARTHROSCOPY WITH DEBRIDEMENT PARTIAL MENISECTOMY AND AUTOGRAPH ACL RECONSTRUCTION;  Surgeon: Sydnee Cabal, MD;  Location: [place];  Service: Orthopedics;  Laterality: Right;  . KNEE ARTHROSCOPY N/A 01/2014  . KNEE REPAIR EXTENSOR MECHANISM  2015   . RETINAL DETACHMENT REPAIR W/ SCLERAL BUCKLE LE Left 08-27-2006  . SVT ABLATION  1995 (approx.  age 48's)    Social History   Socioeconomic History  . Marital status: Single    Spouse name: Not on file  . Number of children: 4  . Years of education: Not on file  . Highest education level: Not on file  Occupational History  . Occupation: Research scientist (physical sciences): [employer]  . Financial resource strain: Not on file  . Food insecurity    Worry: Not on file    Inability: Not on file  . Transportation needs    Medical: Not on file    Non-medical: Not on file  Tobacco Use  . Smoking status: Former Smoker    Types: Cigarettes    Quit date: 2010    Years since quitting: 10.6  . Smokeless tobacco: Never Used  . Tobacco comment: social smoker  Substance and Sexual Activity  . Alcohol use: Yes    Comment: Glass or two of wine on occasion, past history of heavy use   . Drug use: No    Comment: hx   THC use, ended 10+ years ago-- (01-17-2014   DENIES  . Sexual activity: Not Currently    Partners: Male  Lifestyle  . Physical activity    Days per week: Not on file  Minutes per session: Not on file  . Stress: Not on file  Relationships  . Social Musicianconnections    Talks on phone: Not on file    Gets together: Not on file    Attends religious service: Not on file    Active member of club or organization: Not on file    Attends meetings of clubs or organizations: Not on file    Relationship status: Not on file  . Intimate partner violence    Fear of current or ex partner: Not on file    Emotionally abused: Not on file    Physically abused: Not on file    Forced sexual activity: Not on file  Other Topics Concern  . Not on file  Social History Narrative   ** Merged History Encounter **       [place]ydney [place] She reports that her childhood was "okay." Her biological father left when she was 89 or 89 years of age. She  has 7 half-siblings by her father, and one half-sibling by her mother. She graduated high school and attended one year of college. She has never been married. She has 4 children, a 49 year old son, a 49 year old son, a 246 year old daughter, and her 49 year old daughter. Her 306 year old and 49 year old live with her now. 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 She denies any legal problems. She affiliates as a Loss adjuster, charterednondenominational Christian. She reports that she has one friend who she uses for social support.    Current Outpatient Medications on File Prior to Visit  Medication Sig Dispense Refill  . albuterol (VENTOLIN HFA) 108 (90 Base) MCG/ACT inhaler Inhale 1 puff into the lungs every 6 (six) hours as needed for wheezing or shortness of breath.    . carvedilol (COREG) 25 MG tablet Take 1 tablet (25 mg total) by mouth 2 (two) times daily. 180 tablet 1  . nitroGLYCERIN (NITROSTAT) 0.4 MG SL tablet Place 1 tablet (0.4 mg total) under the tongue every 5 (five) minutes as needed for chest pain (CP or SOB). 30 tablet 0  . sacubitril-valsartan (ENTRESTO) 97-103 MG Take 1 tablet by mouth 2 (two) times daily. 180 tablet 1  . sertraline (ZOLOFT) 100 MG tablet Take 100 mg by mouth daily.    Marland Kitchen. spironolactone (ALDACTONE) 50 MG tablet Take 1 tablet (50 mg total) by mouth daily. 30 tablet 3   No current facility-administered medications on file prior to visit.    Review of Systems  Constitution: Positive for malaise/fatigue (and day time somnolence). Negative for chills, decreased appetite and weight gain.  Cardiovascular: Positive for chest pain and dyspnea on exertion (mild and stable). Negative for leg swelling, orthopnea and syncope.  Endocrine: Negative for cold intolerance.  Hematologic/Lymphatic: Does not bruise/bleed easily.  Musculoskeletal: Negative for joint swelling.  Gastrointestinal: Negative for abdominal pain, anorexia and change in bowel  habit.  Neurological: Negative for headaches and light-headedness.  Psychiatric/Behavioral: Negative for depression and substance abuse.  All other systems reviewed and are negative.  Objective:  Blood pressure (!) 140/93, pulse 96, temperature (!) 97.3 F (36.3 C), height 5\' 4"  (1.626 m), weight 236 lb (107 kg), SpO2 97 %. Body mass index is 40.51 kg/m.   Physical Exam  Constitutional: She appears well-developed. No distress.  Morbidly obese  HENT:  Head: Atraumatic.  Eyes: Conjunctivae are normal.  Neck: Neck supple. No thyromegaly present.  Short neck and difficult to  evaluate JVP  Cardiovascular: Normal rate, regular rhythm, intact distal pulses and normal pulses. Exam reveals no gallop.  No murmur heard. Femoral and popliteal pulse difficult to feel due to patient's body habitus.  No edema.   Pulmonary/Chest: Effort normal and breath sounds normal.  Abdominal: Soft. Bowel sounds are normal.  Obese. Pannus present  Musculoskeletal: Normal range of motion.  Neurological: She is alert.  Skin: Skin is warm and dry.  Psychiatric: She has a normal mood and affect.     Radiology: No results found.  Laboratory Examination: CMP Latest Ref Rng & Units 02/02/2019 12/12/2018 10/24/2018  Glucose 70 - 99 mg/dL 98 782(N113(H) 95  BUN 6 - 20 mg/dL 20 6 10   Creatinine 0.44 - 1.00 mg/dL 5.620.99 1.300.92 8.65(H1.22(H)  Sodium 135 - 145 mmol/L 138 137 134(L)  Potassium 3.5 - 5.1 mmol/L 3.7 3.6 4.4  Chloride 98 - 111 mmol/L 105 99 101  CO2 22 - 32 mmol/L 22 25 22   Calcium 8.9 - 10.3 mg/dL 8.4(O8.4(L) 9.2 9.6(E8.6(L)  Total Protein 6.5 - 8.1 g/dL - 7.3 7.2  Total Bilirubin 0.3 - 1.2 mg/dL - 0.5 0.4  Alkaline Phos 38 - 126 U/L - 62 47  AST 15 - 41 U/L - 14(L) 18  ALT 0 - 44 U/L - 12 16   CBC Latest Ref Rng & Units 02/02/2019 12/12/2018 10/24/2018  WBC 4.0 - 10.5 K/uL 6.2 7.2 4.3  Hemoglobin 12.0 - 15.0 g/dL 11.7(L) 12.0 11.6(L)  Hematocrit 36.0 - 46.0 % 37.6 38.9 39.4  Platelets 150 - 400 K/uL 384 450(H) 334    Lipid Panel  No results found for: CHOL, TRIG, HDL, CHOLHDL, VLDL, LDLCALC, LDLDIRECT HEMOGLOBIN A1C No results found for: HGBA1C, MPG TSH Recent Labs    10/15/18 1342  TSH 0.861    Cardiac studies:   Event Monitor for 30 days: Critical event report 09/05/2018 at 1115: NSR with 12 beats of VT that was auto-triggered. No reported symptoms- Nuclear stress ordered  Lexiscan Sestamibi stress test 10/19/2018: 1. Lexiscan stress test was performed. Exercise capacity was not assessed. Resting blood pressure was 170/100 mmHg and peak effect blood pressure was 160/90 mmHg. Stress EKG is non diagnostic for ischemia as it is a pharmacologic stress. In addition, the stress electrocardiogram showed sinus tachycardia, normal stress conduction, no stress arrhythmias and nonspecific ST-T changes.   2. The overall quality of the study is good. There is no evidence of abnormal lung activity. Stress and rest SPECT images demonstrate homogeneous tracer distribution throughout the myocardium. LV cavity is dilated on both rest and stress images. Gated SPECT imaging reveals global decrease in myocardial thickening and wall motion. The left ventricular ejection fraction was reduced at 29%.   3. High risk study due to reduced LVEF. No evidence of ischemia/ infarction seen.  Echocardiogram 03/19/2019: Left ventricle cavity is normal in size. Moderate concentric hypertrophy of the left ventricle. Normal global wall motion. Low normal LV systolic function with visual EF 50-55%. Normal diastolic filling pattern.  No significant valvular abnormalities. Compared to previous study on 09/01/2018, there is significant improvement in LVEF, mitral regurgitation not appreciated.   Assessment:      ICD-10-CM   1. Chronic systolic (congestive) heart failure (HCC)  I50.22   2. Non-ischemic cardiomyopathy (HCC)  I42.8   3. Essential hypertension  I10   4. Class 2 severe obesity due to excess calories with serious comorbidity  and body mass index (BMI) of 38.0 to 38.9 in adult (HCC)  E66.01  Z68.38     EKG 10/14/2018: Sinus tachycardia 100 bpm. Left atrial enlargmeent. No ischemic changes.   Recommendations:    Ms. 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 On aggressive medical therapy her ejection fraction is improved by recent echocardiogram In August 2020.  She is on Entresto to maximum dose along with carvedilol to maximum dose.  Patient is presently doing well and does not appear to be in acute distress and does not appear to be in  congestive heart failure by physical exam, her EF has now normalized. 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   She is also on Aldactone 50 mg daily for CHF and hypertension. I have reviewed the results of the echocardiogram, also prior event monitor with the patient, the gravity of morbid obesity was discussed with the patient in detail.  I'll increase her to cut down on the foods that have high calories, she'll continue to restrict her salt intake, I'll like to see her back in 6 weeks.  I increased the dose of carvedilol from 25 mg b.i.d. to 37.5 mg p.o. b.i.d. She has had history of NSVT in the past but there is no syncope or dizziness, also now her EF has improved.   Yates Decamp, MD, [facility] 04/12/2019, 4:48 PM [facility] Pager: [phone] Office: [phone] If no answer Cell [phone]

## 2019-05-06 DIAGNOSIS — Z23 Encounter for immunization: Secondary | ICD-10-CM | POA: Diagnosis not present

## 2019-05-06 DIAGNOSIS — I509 Heart failure, unspecified: Secondary | ICD-10-CM | POA: Diagnosis not present

## 2019-05-06 DIAGNOSIS — I110 Hypertensive heart disease with heart failure: Secondary | ICD-10-CM | POA: Diagnosis not present

## 2019-05-06 DIAGNOSIS — N921 Excessive and frequent menstruation with irregular cycle: Secondary | ICD-10-CM | POA: Diagnosis not present

## 2019-05-10 ENCOUNTER — Other Ambulatory Visit: Payer: BLUE CROSS/BLUE SHIELD

## 2019-05-11 ENCOUNTER — Other Ambulatory Visit: Payer: Self-pay | Admitting: Family Medicine

## 2019-05-11 DIAGNOSIS — N921 Excessive and frequent menstruation with irregular cycle: Secondary | ICD-10-CM

## 2019-05-18 ENCOUNTER — Ambulatory Visit
Admission: RE | Admit: 2019-05-18 | Discharge: 2019-05-18 | Disposition: A | Discharge status: Home / Self Care | Payer: BC Managed Care – PPO | Source: Ambulatory Visit | Attending: Family Medicine | Admitting: Family Medicine

## 2019-05-18 DIAGNOSIS — N921 Excessive and frequent menstruation with irregular cycle: Secondary | ICD-10-CM

## 2019-05-18 DIAGNOSIS — N920 Excessive and frequent menstruation with regular cycle: Secondary | ICD-10-CM | POA: Diagnosis not present

## 2019-05-24 ENCOUNTER — Ambulatory Visit: Payer: BC Managed Care – PPO | Admitting: Cardiology

## 2019-06-11 IMAGING — CT CT ABDOMEN AND PELVIS WITH CONTRAST
2 of 5 series · 16 of 46 positions shown, 18 images · IV contrast (Omni 300)
Comparison: Abdominal ultrasound 03/26/2016

CLINICAL DATA: 48-year-old with nausea and vomiting. Epigastric
pain.

EXAM:
CT ABDOMEN AND PELVIS WITH CONTRAST
TECHNIQUE: Multidetector CT imaging of the abdomen and pelvis was performed
using the standard protocol following bolus administration of
intravenous contrast.
CONTRAST:  100mL OMNIPAQUE IOHEXOL 300 MG/ML  SOLN

[Series 3: a/p w/ 5mm · axial · 0.70mm/px · z∈[+936,+1311]mm · 13 of 85 slices shown, 15 images]
[im 5/85  soft-tissue]
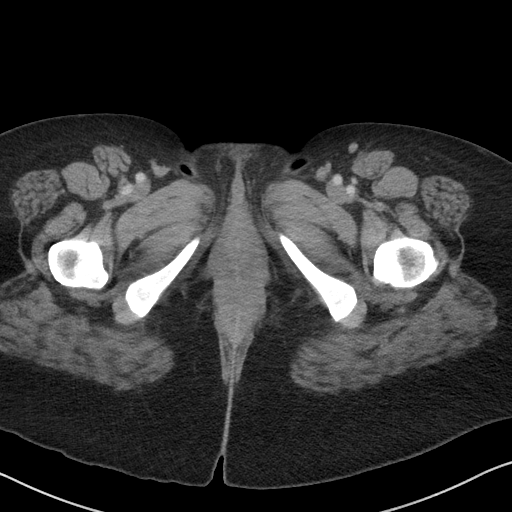
[im 5/85  bone]
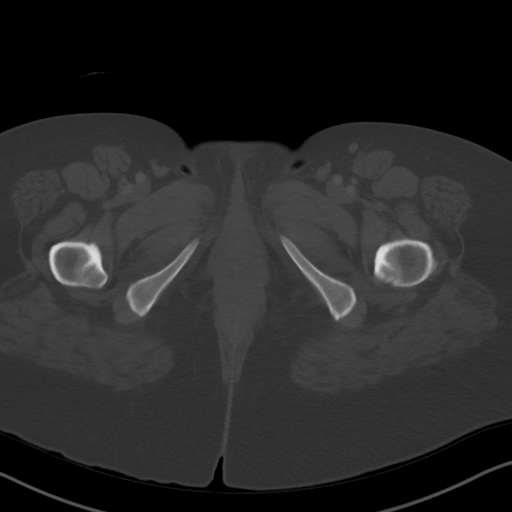
[im 13/85  soft-tissue]
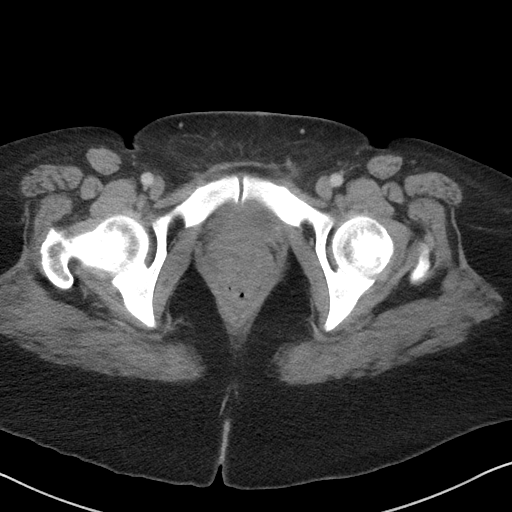
[im 17/85  soft-tissue]
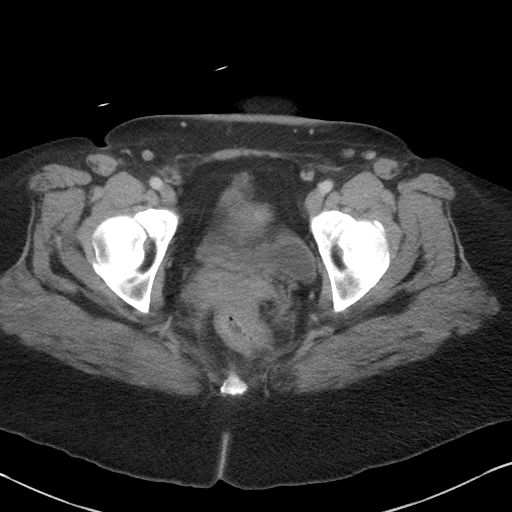
[im 26/85  soft-tissue]
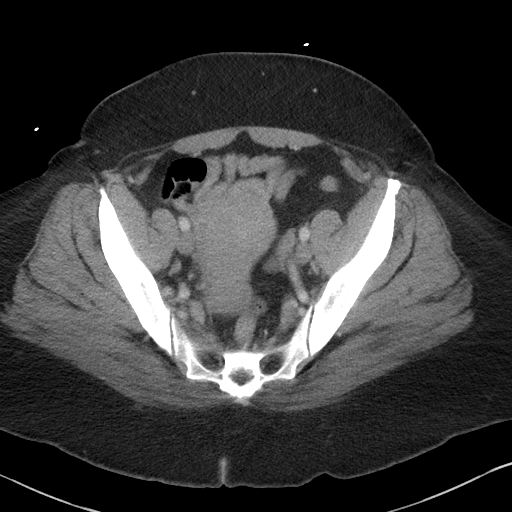
[im 30/85  soft-tissue]
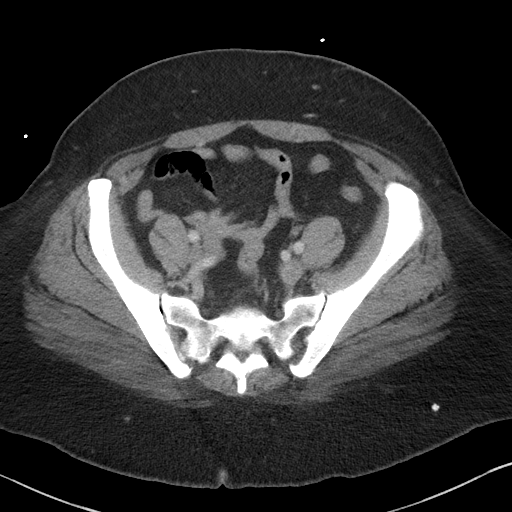
[im 38/85  soft-tissue]
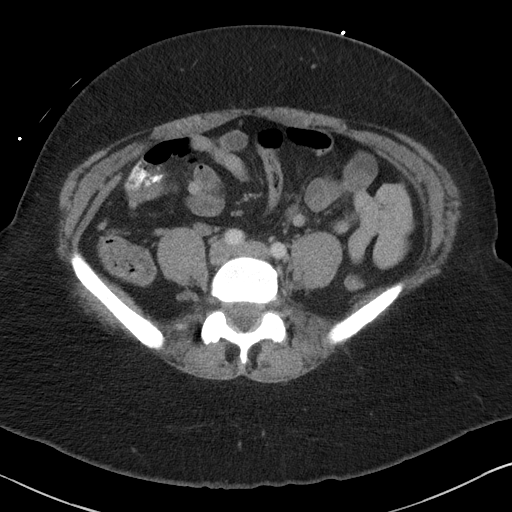
[im 43/85  soft-tissue]
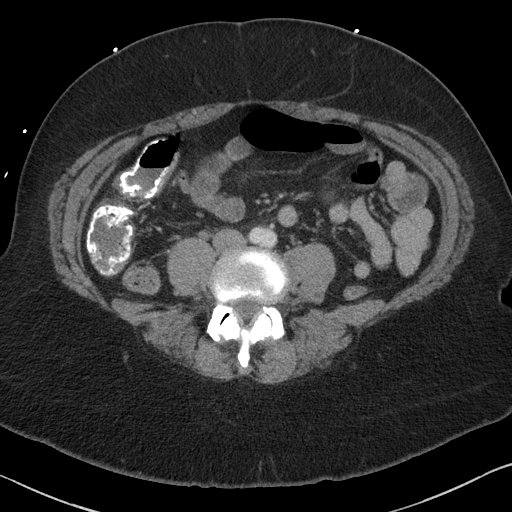
[im 47/85  soft-tissue]
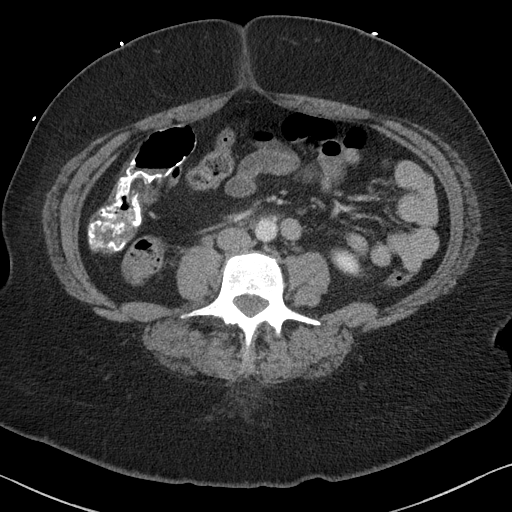
[im 55/85  soft-tissue]
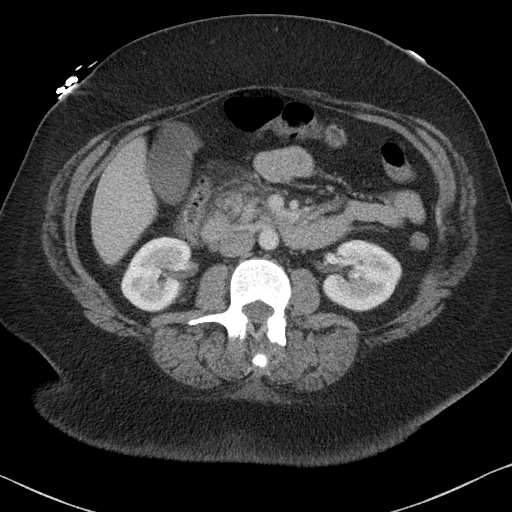
[im 55/85  bone]
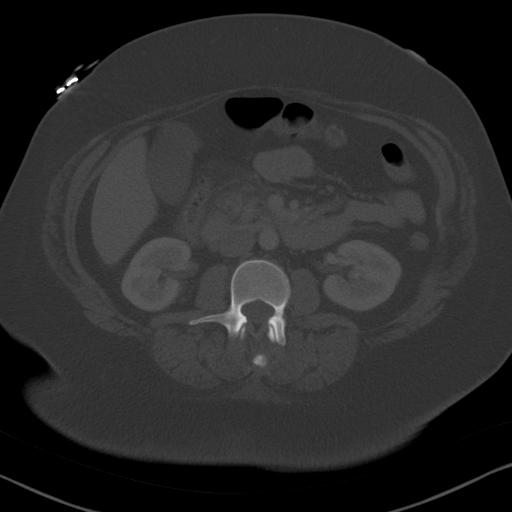
[im 59/85  soft-tissue]
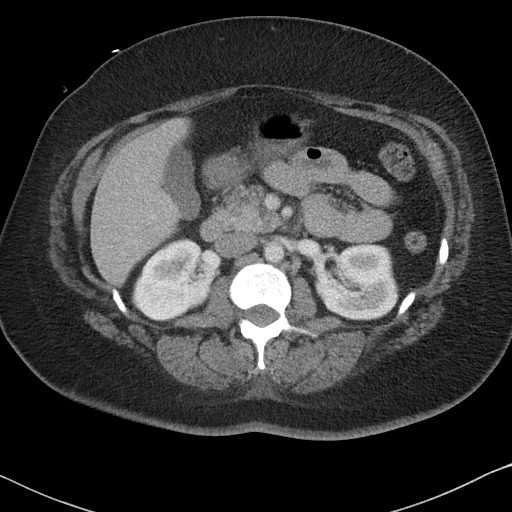
[im 68/85  soft-tissue]
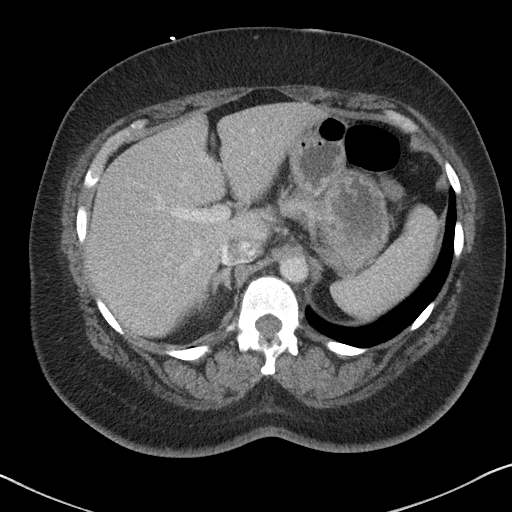
[im 72/85  soft-tissue]
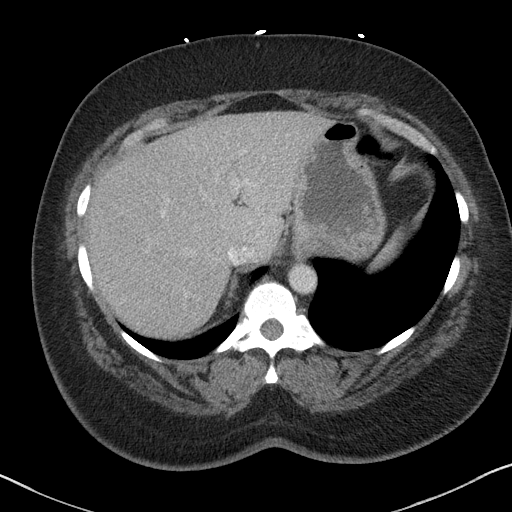
[im 80/85  soft-tissue]
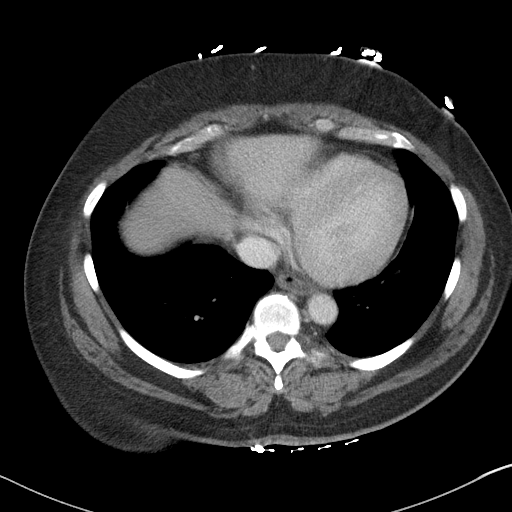

[Series 6: a/p w/ cor · coronal · 0.71mm/px · 3 of 148 slices shown]
[im 50/148  soft-tissue]
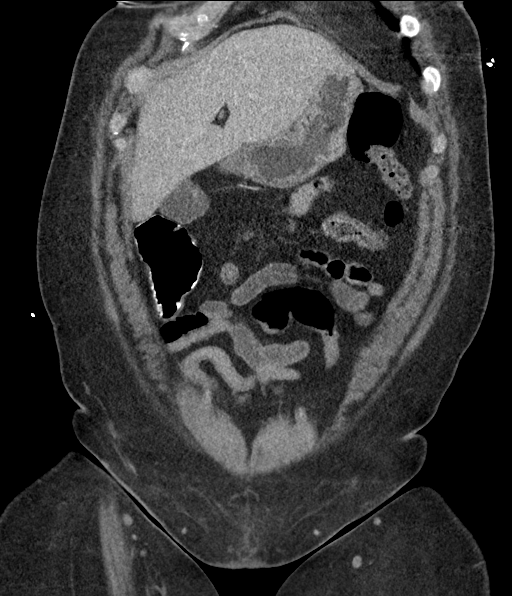
[im 66/148  soft-tissue]
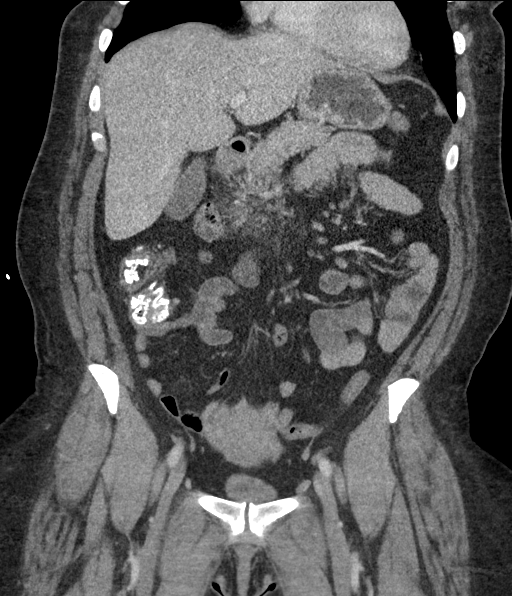
[im 82/148  soft-tissue]
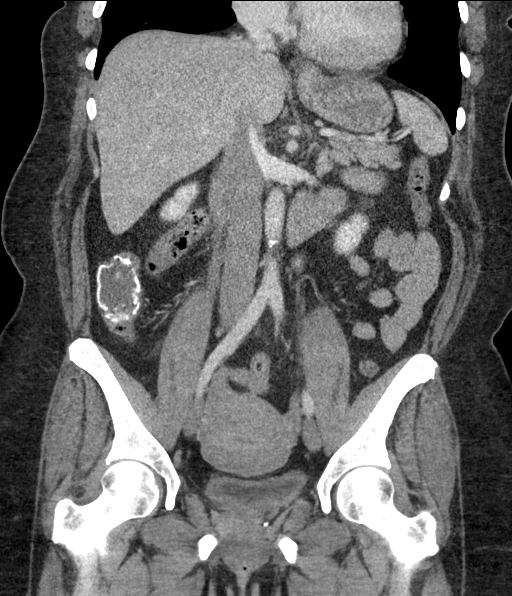

[16 of 46 positions shown; findings below may reference images not displayed]

FINDINGS: Lower chest: Lung bases are clear.

Hepatobiliary: Normal appearance of the liver, gallbladder and
portal venous system. No biliary dilatation

Pancreas: Mild soft tissue stranding just anterior to the pancreatic
head region on sequence 3, image 31. Mild heterogeneity in the
pancreatic neck region on sequence 3, image 27. No pancreatic duct
dilatation.

Spleen: Normal in size without focal abnormality.

Adrenals/Urinary Tract: Minimal fullness in the right adrenal gland
is nonspecific. No significant adrenal abnormality. Urinary bladder
is unremarkable. Negative for hydronephrosis. No suspicious renal
lesion.

Stomach/Bowel: Mild wall thickening near the pylorus and duodenal
bulb but no significant stranding in this area. Inflammatory changes
along the descending duodenum and pancreatic head region. No
evidence for bowel dilatation. Normal appendix. Normal appearance of
the terminal ileum. Right side of the transverse colon is adjacent
to the mild inflammatory changes in the upper abdomen. There may be
a small colonic diverticulum in this area but the nidus of the
inflammation does not appear to be coming from the colon.

Vascular/Lymphatic: No significant vascular findings are present. No
enlarged abdominal or pelvic lymph nodes.

Reproductive: Uterus and bilateral adnexa are unremarkable.

Other: Negative for ascites.  Negative for free air.

Musculoskeletal: Degenerative facet disease in the lower lumbar
spine particularly at L4-L5.
IMPRESSION: Mild inflammatory changes in the central upper abdomen. Inflammation
is centered around the pancreatic head and duodenum. Differential
diagnosis includes pancreatitis and/or duodenitis. No focal fluid
collections. No evidence for free air.

## 2019-06-18 ENCOUNTER — Ambulatory Visit: Payer: BC Managed Care – PPO | Admitting: Cardiology

## 2019-06-18 ENCOUNTER — Other Ambulatory Visit: Payer: Self-pay

## 2019-06-18 ENCOUNTER — Encounter: Payer: Self-pay | Admitting: Cardiology

## 2019-06-18 VITALS — BP 140/80 | HR 92 | Ht 64.0 in | Wt 233.0 lb

## 2019-06-18 DIAGNOSIS — I5032 Chronic diastolic (congestive) heart failure: Secondary | ICD-10-CM

## 2019-06-18 DIAGNOSIS — I428 Other cardiomyopathies: Secondary | ICD-10-CM

## 2019-06-18 DIAGNOSIS — I10 Essential (primary) hypertension: Secondary | ICD-10-CM

## 2019-06-18 DIAGNOSIS — Z6838 Body mass index (BMI) 38.0-38.9, adult: Secondary | ICD-10-CM

## 2019-06-18 DIAGNOSIS — E66812 Obesity, class 2: Secondary | ICD-10-CM

## 2019-06-18 NOTE — Progress Notes (Signed)
Subjective:  Primary Physician/Referring:  Laurann Montana, MD  Patient ID: Yetta Barre, female    DOB: 03-22-1971, 48 y.o.   MRN: [id number]  Chief Complaint  Patient presents with  . Hypertension  . Cardiomyopathy  . Follow-up    HPI: LICETTE ANDRE  is a 48 y.o. female  with hypertension, history of SVT status post ablation at age 10-25 years in [place], [place], new diagnosis of cardiomyopathy diagnosed in Jan 2020 with EF 25-30%, grade 2 diastolic dysfunction, grade 2 mitral regurgitation, nonsustained VT seen on event monitor with normal perfusion with low EF by nuclear stress in March 2020. She is on Entresto to maximum dose along with carvedilol to maximum dose.   She has not had any worsening shortness of breath, orthopnea, PND, leg edema. Underwent echocardiogram and presents for a 3 month f/u.  She has had occasional chest pain occasionally and states it feels like something is poking her. No exertional component. She has pain also when she is laying down.  Has had 3-4 episodes in the past 1-2 weeks.   Past Medical History:  Diagnosis Date  . Chronic systolic (congestive) heart failure (HCC) 10/14/2018   Echocardiogram 03/19/2019: Low normal LV systolic function with visual EF 50-55%.  . Depression   . Herpes simplex disease   . History of syncope    s/p  heart valve repair  1995  . Hypertension   . NSVT (nonsustained ventricular tachycardia) (HCC)   . Palpitations   . Right ACL tear   . Right knee meniscal tear     Past Surgical History:  Procedure Laterality Date  . ABLATION     SVT at age 48 years  . KNEE ARTHROSCOPY Right 01/21/2014   Procedure: RIGHT  KNEE ARTHROSCOPY WITH DEBRIDEMENT PARTIAL MENISECTOMY AND AUTOGRAPH ACL RECONSTRUCTION;  Surgeon: Eugenia Mcalpine, MD;  Location: [facility];  Service: Orthopedics;  Laterality: Right;  . KNEE ARTHROSCOPY N/A 01/2014  . KNEE REPAIR EXTENSOR MECHANISM  2015  . RETINAL DETACHMENT REPAIR W/ SCLERAL  BUCKLE LE Left 08-27-2006  . SVT ABLATION  1995 (approx.  age 53's)    Social History   Socioeconomic History  . Marital status: Single    Spouse name: Not on file  . Number of children: 4  . Years of education: Not on file  . Highest education level: Not on file  Occupational History  . Occupation: Nutritional therapist: [employer]  Social Needs  . Financial resource strain: Not on file  . Food insecurity    Worry: Not on file    Inability: Not on file  . Transportation needs    Medical: Not on file    Non-medical: Not on file  Tobacco Use  . Smoking status: Former Smoker    Types: Cigarettes    Quit date: 2010    Years since quitting: 10.8  . Smokeless tobacco: Never Used  . Tobacco comment: social smoker  Substance and Sexual Activity  . Alcohol use: Yes    Comment: Glass or two of wine on occasion, past history of heavy use   . Drug use: No    Comment: hx   THC use, ended 10+ years ago-- (01-17-2014   DENIES  . Sexual activity: Not Currently    Partners: Male  Lifestyle  . Physical activity    Days per week: Not on file    Minutes per session: Not on file  . Stress: Not on  file  Relationships  . Social Musicianconnections    Talks on phone: Not on file    Gets together: Not on file    Attends religious service: Not on file    Active member of club or organization: Not on file    Attends meetings of clubs or organizations: Not on file    Relationship status: Not on file  . Intimate partner violence    Fear of current or ex partner: Not on file    Emotionally abused: Not on file    Physically abused: Not on file    Forced sexual activity: Not on file  Other Topics Concern  . Not on file  Social History Narrative   ** Merged History Encounter **       Andrey CampanileSandy was [place] She reports that her childhood was "okay." Her biological father left when she was 89 or 89 years of age. She has 7 half-siblings by her father, and  one half-sibling by her mother. She graduated high school and attended one year of college. She has never been married. She has 4 children, a 49 year old son, a 49 year old son, a 49 year old daughter, and her 49 year old daughter. Her 49 year old and 49 year old live with her now. 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 She denies any legal problems. She affiliates as a Loss adjuster, charterednondenominational Christian. She reports that she has one friend who she uses for social support.    Current Outpatient Medications on File Prior to Visit  Medication Sig Dispense Refill  . albuterol (VENTOLIN HFA) 108 (90 Base) MCG/ACT inhaler Inhale 1 puff into the lungs every 6 (six) hours as needed for wheezing or shortness of breath.    . carvedilol (COREG) 25 MG tablet Take 1.5 tablets (37.5 mg total) by mouth 2 (two) times daily. 270 tablet 3  . nitroGLYCERIN (NITROSTAT) 0.4 MG SL tablet Place 1 tablet (0.4 mg total) under the tongue every 5 (five) minutes as needed for chest pain (CP or SOB). 30 tablet 0  . sacubitril-valsartan (ENTRESTO) 97-103 MG Take 1 tablet by mouth 2 (two) times daily. 180 tablet 1  . sertraline (ZOLOFT) 100 MG tablet Take 100 mg by mouth daily.    Marland Kitchen. spironolactone (ALDACTONE) 50 MG tablet Take 1 tablet (50 mg total) by mouth daily. 30 tablet 3   No current facility-administered medications on file prior to visit.     Review of Systems  Constitution: Positive for malaise/fatigue (and day time somnolence). Negative for chills, decreased appetite and weight gain.  Cardiovascular: Positive for chest pain and dyspnea on exertion (mild and stable). Negative for leg swelling, orthopnea and syncope.  Endocrine: Negative for cold intolerance.  Hematologic/Lymphatic: Does not bruise/bleed easily.  Musculoskeletal: Negative for joint swelling.  Gastrointestinal: Negative for abdominal pain, anorexia and change in bowel habit.  Neurological: Negative for  headaches and light-headedness.  Psychiatric/Behavioral: Negative for depression and substance abuse.  All other systems reviewed and are negative.  Objective:   Vitals with BMI 06/18/2019 04/12/2019 02/03/2019  Height 5\' 4"  5\' 4"  -  Weight 233 lbs 236 lbs -  BMI 39.97 40.49 -  Systolic 140 140 [id number]  Diastolic 80 93 89  Pulse 92 96 87    Physical Exam  Constitutional: She appears well-developed. No distress.  Morbidly obese  HENT:  Head: Atraumatic.  Eyes: Conjunctivae are normal.  Neck: Neck supple. No thyromegaly present.  Short neck and difficult  to evaluate JVP  Cardiovascular: Normal rate, regular rhythm, intact distal pulses and normal pulses. Exam reveals no gallop.  No murmur heard. Femoral and popliteal pulse difficult to feel due to patient's body habitus.  No edema.   Pulmonary/Chest: Effort normal and breath sounds normal.  Abdominal: Soft. Bowel sounds are normal.  Obese. Pannus present  Musculoskeletal: Normal range of motion.  Neurological: She is alert.  Skin: Skin is warm and dry.  Psychiatric: She has a normal mood and affect.    Radiology: No results found.  Laboratory Examination: CMP Latest Ref Rng & Units 02/02/2019 12/12/2018 10/24/2018  Glucose 70 - 99 mg/dL 98 113(H) 95  BUN 6 - 20 mg/dL 20 6 10   Creatinine 0.44 - 1.00 mg/dL 0.99 0.92 1.22(H)  Sodium 135 - 145 mmol/L 138 137 134(L)  Potassium 3.5 - 5.1 mmol/L 3.7 3.6 4.4  Chloride 98 - 111 mmol/L 105 99 101  CO2 22 - 32 mmol/L 22 25 22   Calcium 8.9 - 10.3 mg/dL 8.4(L) 9.2 8.6(L)  Total Protein 6.5 - 8.1 g/dL - 7.3 7.2  Total Bilirubin 0.3 - 1.2 mg/dL - 0.5 0.4  Alkaline Phos 38 - 126 U/L - 62 47  AST 15 - 41 U/L - 14(L) 18  ALT 0 - 44 U/L - 12 16   CBC Latest Ref Rng & Units 02/02/2019 12/12/2018 10/24/2018  WBC 4.0 - 10.5 K/uL 6.2 7.2 4.3  Hemoglobin 12.0 - 15.0 g/dL 11.7(L) 12.0 11.6(L)  Hematocrit 36.0 - 46.0 % 37.6 38.9 39.4  Platelets 150 - 400 K/uL 384 450(H) 334   Lipid Panel  No results  found for: CHOL, TRIG, HDL, CHOLHDL, VLDL, LDLCALC, LDLDIRECT HEMOGLOBIN A1C No results found for: HGBA1C, MPG TSH Recent Labs    10/15/18 1342  TSH 0.861    Cardiac studies:   Event Monitor for 30 days: Critical event report 09/05/2018 at 1115: NSR with 12 beats of VT that was auto-triggered. No reported symptoms- Nuclear stress ordered  Lexiscan Sestamibi stress test 10/19/2018: 1. Lexiscan stress test was performed. Exercise capacity was not assessed. Resting blood pressure was 170/100 mmHg and peak effect blood pressure was 160/90 mmHg. Stress EKG is non diagnostic for ischemia as it is a pharmacologic stress. In addition, the stress electrocardiogram showed sinus tachycardia, normal stress conduction, no stress arrhythmias and nonspecific ST-T changes.   2. The overall quality of the study is good. There is no evidence of abnormal lung activity. Stress and rest SPECT images demonstrate homogeneous tracer distribution throughout the myocardium. LV cavity is dilated on both rest and stress images. Gated SPECT imaging reveals global decrease in myocardial thickening and wall motion. The left ventricular ejection fraction was reduced at 29%.   3. High risk study due to reduced LVEF. No evidence of ischemia/ infarction seen.  Echocardiogram 03/19/2019: Left ventricle cavity is normal in size. Moderate concentric hypertrophy of the left ventricle. Normal global wall motion. Low normal LV systolic function with visual EF 50-55%. Normal diastolic filling pattern.  No significant valvular abnormalities. Compared to previous study on 09/01/2018, there is significant improvement in LVEF, mitral regurgitation not appreciated.   Assessment:      ICD-10-CM   1. Non-ischemic cardiomyopathy (HCC)  I42.8 EKG 12-Lead  2. Chronic diastolic (congestive) heart failure (HCC)  I50.32   3. Essential hypertension  I10   4. Class 2 severe obesity due to excess calories with serious comorbidity and body mass  index (BMI) of 38.0 to 38.9 in adult (HCC)  E66.01  Z68.38     EKG 06/18/2019: Normal sinus rhythm at rate of 84 bpm, left atrial abnormality, poor R-wave progression, probably normal variant.  No evidence of ischemia. No significant change from  EKG 10/14/2018: Sinus tachycardia 100 bpm    Recommendations:    No orders of the defined types were placed in this encounter.   Ms. 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 On aggressive medical therapy her ejection fraction is improved by recent echocardiogram In August 2020.  She is on Entresto to maximum dose along with carvedilol to maximum dose. HR has improved. Patient is presently doing well. 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  She is seeing a Nutritionist, she has started phentermine for obesity.  Given normal LVEF, she could certainly use this for short period of time.  She is also on Aldactone 50 mg daily for CHF and hypertension.BP is now controlled and her CV risks except for obesity is controlled.  She will f/u with sleep study. Healthy sleeping habits discussed. OV in 6 months.   Yates Decamp, MD, [facility] 06/18/2019, 2:59 PM [facility] [place] Pager: [phone] Office: [phone] If no answer Cell [phone]

## 2019-06-30 DIAGNOSIS — R519 Headache, unspecified: Secondary | ICD-10-CM | POA: Diagnosis not present

## 2019-08-02 IMAGING — CR CHEST - 2 VIEW
2 series · 2 of 2 positions shown · non-contrast
Comparison: None.

CLINICAL DATA: Chest tightness and cough

EXAM:
CHEST - 2 VIEW

[w chest pa]
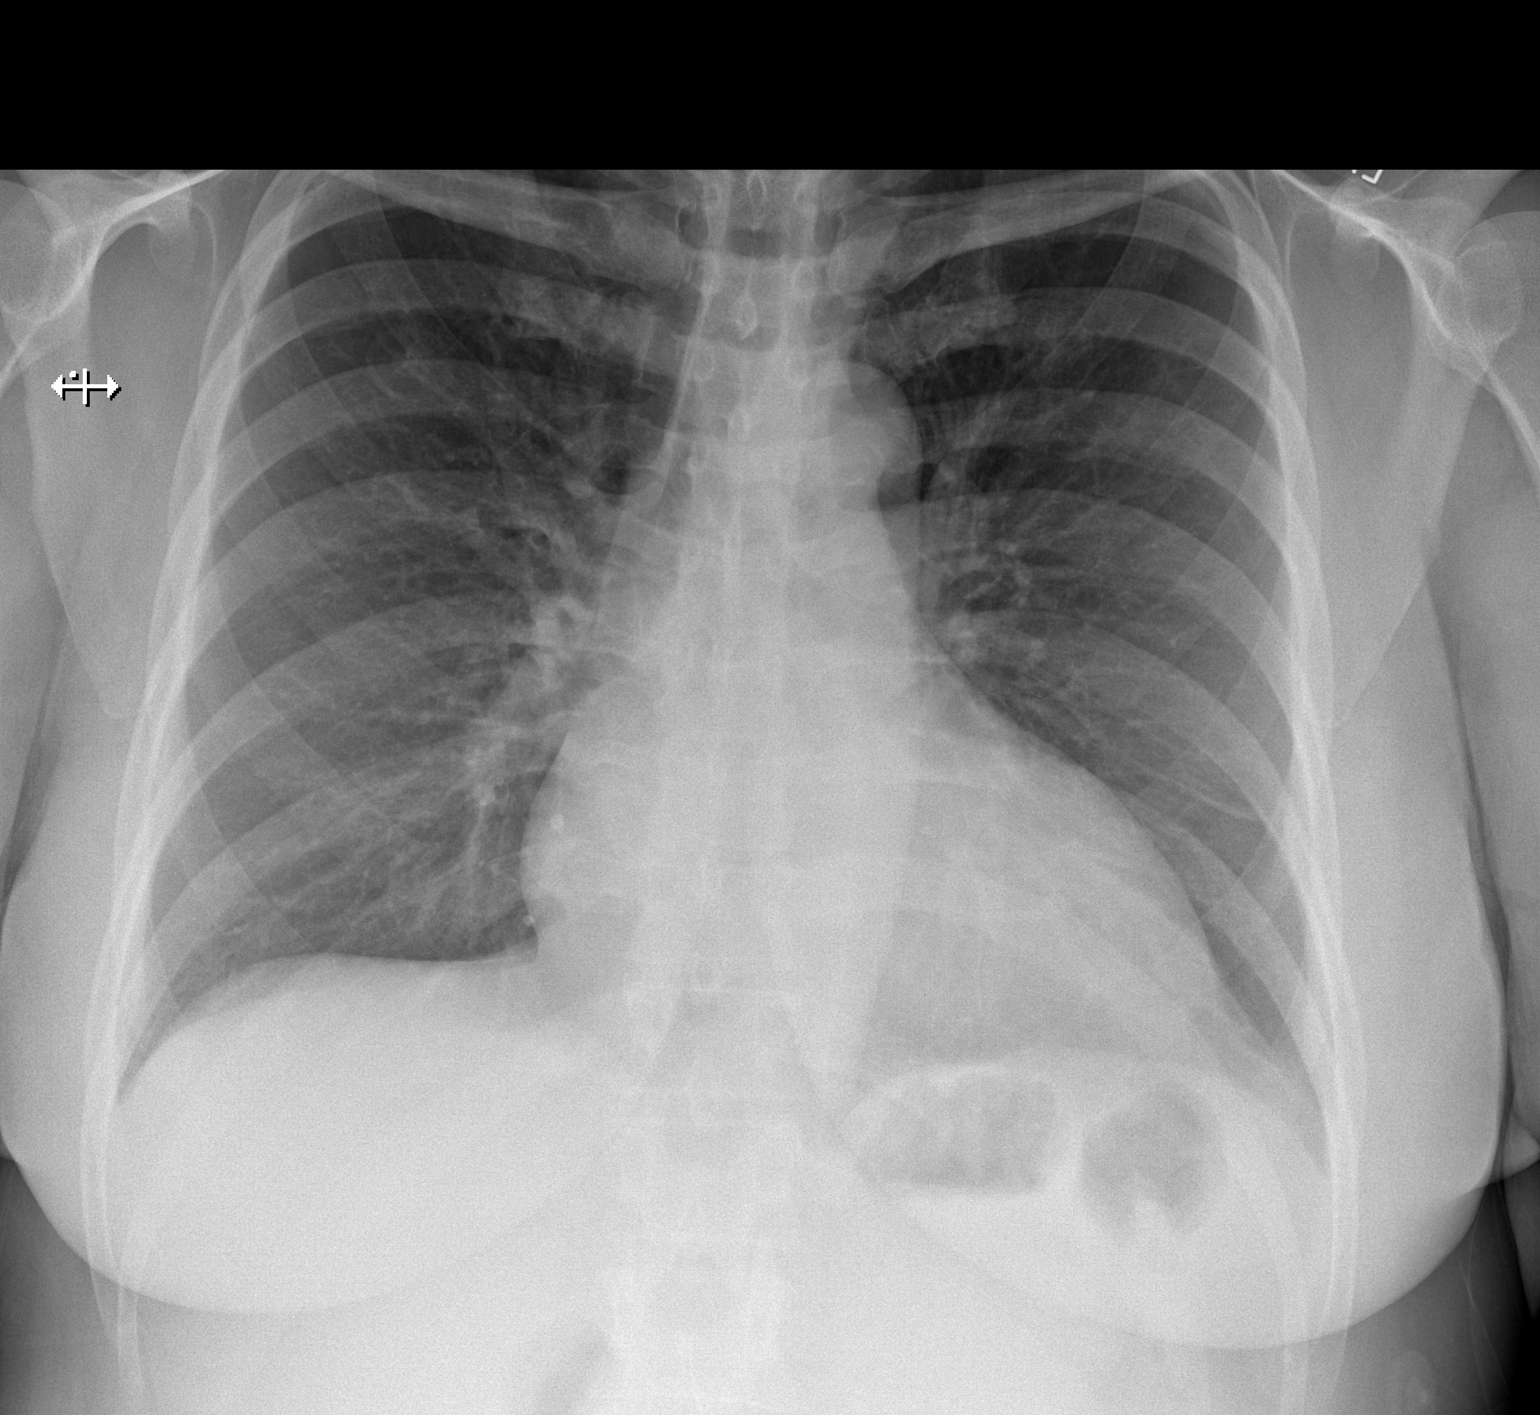

[w chest lat]
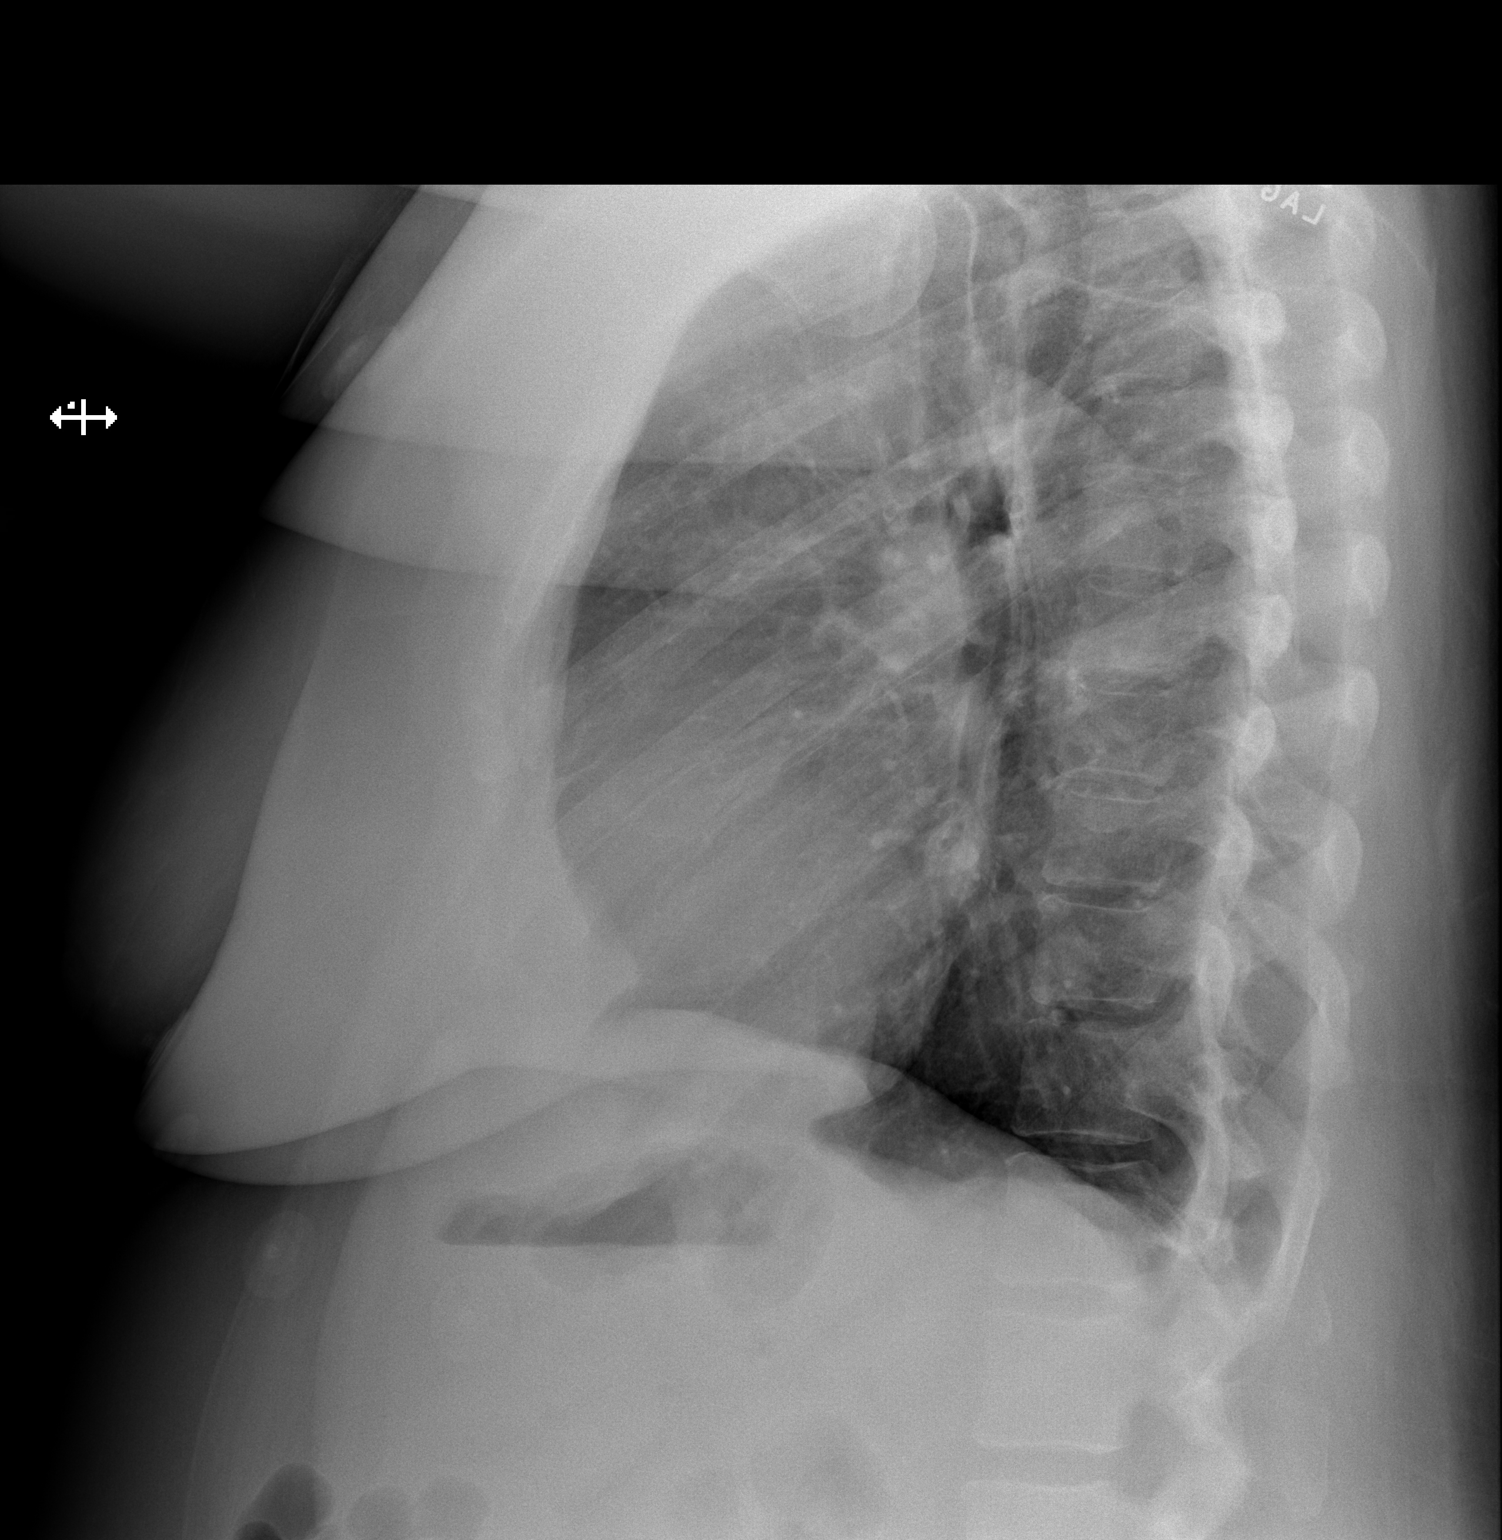

[2 of 2 positions shown; findings below may reference images not displayed]

FINDINGS: The heart size and mediastinal contours are within normal limits.
Both lungs are clear. The visualized skeletal structures are
unremarkable.
IMPRESSION: No active cardiopulmonary disease.

## 2019-08-23 DIAGNOSIS — I110 Hypertensive heart disease with heart failure: Secondary | ICD-10-CM | POA: Diagnosis not present

## 2019-08-23 DIAGNOSIS — Z0000 Encounter for general adult medical examination without abnormal findings: Secondary | ICD-10-CM | POA: Diagnosis not present

## 2019-08-23 DIAGNOSIS — N920 Excessive and frequent menstruation with regular cycle: Secondary | ICD-10-CM | POA: Diagnosis not present

## 2019-09-21 DIAGNOSIS — Z1231 Encounter for screening mammogram for malignant neoplasm of breast: Secondary | ICD-10-CM | POA: Diagnosis not present

## 2019-10-22 DIAGNOSIS — Z8679 Personal history of other diseases of the circulatory system: Secondary | ICD-10-CM | POA: Diagnosis not present

## 2019-10-22 DIAGNOSIS — Z1211 Encounter for screening for malignant neoplasm of colon: Secondary | ICD-10-CM | POA: Diagnosis not present

## 2019-11-01 ENCOUNTER — Encounter: Payer: Self-pay | Admitting: Cardiology

## 2019-11-08 DIAGNOSIS — L239 Allergic contact dermatitis, unspecified cause: Secondary | ICD-10-CM | POA: Diagnosis not present

## 2019-11-08 DIAGNOSIS — L659 Nonscarring hair loss, unspecified: Secondary | ICD-10-CM | POA: Diagnosis not present

## 2019-11-08 DIAGNOSIS — L658 Other specified nonscarring hair loss: Secondary | ICD-10-CM | POA: Diagnosis not present

## 2019-11-29 DIAGNOSIS — L659 Nonscarring hair loss, unspecified: Secondary | ICD-10-CM | POA: Diagnosis not present

## 2019-12-15 NOTE — Progress Notes (Signed)
Primary Physician/Referring:  Harlan Stains, MD  Patient ID: Sydney Jackson, female    DOB: [birth year], 49 y.o.   MRN: [id number]  Chief Complaint  Patient presents with  . Cardiomyopathy  . Hypertension  . Follow-up    6 month   HPI:    FREDDIE NGHIEM  is a 49 y.o. AAF patient with hypertension, history of SVT status post ablation at age 30-25 years in [place], [place], new diagnosis of cardiomyopathy diagnosed in Jan 2020 with EF 68-34%, grade 2 diastolic dysfunction, grade 2 mitral regurgitation, nonsustained VT seen on event monitor with normal perfusion with low EF by nuclear stress in March 2020. She is on Entresto to maximum dose along with carvedilol to maximum dose.   She has not had any worsening shortness of breath, orthopnea, PND, leg edema.  Admits to decreased activity and decreased exercise tolerance.  Past Medical History:  Diagnosis Date  . Chronic systolic (congestive) heart failure ([place]) 10/14/2018   Echocardiogram 03/19/2019: Low normal LV systolic function with visual EF 50-55%.  . Depression   . Herpes simplex disease   . History of syncope    s/p  heart valve repair  1995  . Hypertension   . NSVT (nonsustained ventricular tachycardia) ([place])   . Palpitations   . Right ACL tear   . Right knee meniscal tear    Past Surgical History:  Procedure Laterality Date  . ABLATION     SVT at age 50 years  . KNEE ARTHROSCOPY Right 01/21/2014   Procedure: RIGHT  KNEE ARTHROSCOPY WITH DEBRIDEMENT PARTIAL MENISECTOMY AND AUTOGRAPH ACL RECONSTRUCTION;  Surgeon: Sydnee Cabal, MD;  Location: [facility];  Service: Orthopedics;  Laterality: Right;  . KNEE ARTHROSCOPY N/A 01/2014  . KNEE REPAIR EXTENSOR MECHANISM  2015  . RETINAL DETACHMENT REPAIR W/ SCLERAL BUCKLE LE Left 08-27-2006  . SVT ABLATION  1995 (approx.  age 76's)   Family History  Problem Relation Age of Onset  . Hypertension Mother   . Diabetes Father   . Hypertension Brother   .  Congestive Heart Failure Sister     Social History   Tobacco Use  . Smoking status: Former Smoker    Types: Cigarettes    Quit date: 2010    Years since quitting: 11.3  . Smokeless tobacco: Never Used  . Tobacco comment: social smoker  Substance Use Topics  . Alcohol use: Yes    Comment: Glass or two of wine on occasion, past history of heavy use    Marital Status: Single  ROS  Review of Systems  Constitution: Negative for weight gain.  Cardiovascular: Negative for dyspnea on exertion, leg swelling and syncope.  Respiratory: Negative for shortness of breath.   Musculoskeletal: Negative for joint swelling.   Objective  Blood pressure (!) 158/110, pulse 91, temperature 98.3 F (36.8 C), temperature source Temporal, resp. rate 16, height '5\' 4"'  (1.626 m), weight 234 lb 9.6 oz (106.4 kg), SpO2 97 %.  Vitals with BMI 12/16/2019 12/16/2019 06/18/2019  Height - '5\' 4"'  '5\' 4"'   Weight - 234 lbs 10 oz 233 lbs  BMI - 19.62 22.97  Systolic 989 211 941  Diastolic 740 814 80  Pulse 91 68 92     Physical Exam  Constitutional: She appears well-developed.  Morbidly obese   Cardiovascular: Normal rate, regular rhythm and intact distal pulses. Exam reveals no gallop.  No murmur heard. No leg edema. No JVD.    Pulmonary/Chest: Effort normal and breath sounds  normal. No accessory muscle usage. No respiratory distress.  Abdominal: Soft. Bowel sounds are normal.   Laboratory examination:   Recent Labs    02/02/19 1939  NA 138  K 3.7  CL 105  CO2 22  GLUCOSE 98  BUN 20  CREATININE 0.99  CALCIUM 8.4*  GFRNONAA >60  GFRAA >60   CrCl cannot be calculated (Patient's most recent lab result is older than the maximum 21 days allowed.).  CMP Latest Ref Rng & Units 02/02/2019 12/12/2018 10/24/2018  Glucose 70 - 99 mg/dL 98 113(H) 95  BUN 6 - 20 mg/dL '20 6 10  ' Creatinine 0.44 - 1.00 mg/dL 0.99 0.92 1.22(H)  Sodium 135 - 145 mmol/L 138 137 134(L)  Potassium 3.5 - 5.1 mmol/L 3.7 3.6 4.4  Chloride  98 - 111 mmol/L 105 99 101  CO2 22 - 32 mmol/L '22 25 22  ' Calcium 8.9 - 10.3 mg/dL 8.4(L) 9.2 8.6(L)  Total Protein 6.5 - 8.1 g/dL - 7.3 7.2  Total Bilirubin 0.3 - 1.2 mg/dL - 0.5 0.4  Alkaline Phos 38 - 126 U/L - 62 47  AST 15 - 41 U/L - 14(L) 18  ALT 0 - 44 U/L - 12 16   CBC Latest Ref Rng & Units 02/02/2019 12/12/2018 10/24/2018  WBC 4.0 - 10.5 K/uL 6.2 7.2 4.3  Hemoglobin 12.0 - 15.0 g/dL 11.7(L) 12.0 11.6(L)  Hematocrit 36.0 - 46.0 % 37.6 38.9 39.4  Platelets 150 - 400 K/uL 384 450(H) 334   Lipid Panel  No results found for: CHOL, TRIG, HDL, CHOLHDL, VLDL, LDLCALC, LDLDIRECT HEMOGLOBIN A1C No results found for: HGBA1C, MPG TSH No results for input(s): TSH in the last 8760 hours.  External labs:   10/09/2016: Chol 140, Trig 51, HDL 57, LDL 73.  Medications and allergies  No Known Allergies   Current Outpatient Medications  Medication Instructions  . albuterol (VENTOLIN HFA) 108 (90 Base) MCG/ACT inhaler 1 puff, Inhalation, Every 6 hours PRN  . carvedilol (COREG) 37.5 mg, Oral, 2 times daily  . chlorthalidone (HYGROTON) 25 mg, Oral, BH-each morning  . nitroGLYCERIN (NITROSTAT) 0.4 mg, Sublingual, Every 5 min PRN  . sacubitril-valsartan (ENTRESTO) 97-103 MG 1 tablet, Oral, 2 times daily  . sertraline (ZOLOFT) 100 mg, Oral, Daily  . spironolactone (ALDACTONE) 50 mg, Oral, Daily   Radiology:   No results found.  Cardiac Studies:   Event Monitor for 30 days: Critical event report 09/05/2018 at 1115: NSR with 12 beats of VT that was auto-triggered. No reported symptoms- Nuclear stress ordered  Lexiscan Sestamibi stress test 10/19/2018: 1. Lexiscan stress test was performed. Exercise capacity was not assessed. Resting blood pressure was 170/100 mmHg and peak effect blood pressure was 160/90 mmHg. Stress EKG is non diagnostic for ischemia as it is a pharmacologic stress. In addition, the stress electrocardiogram showed sinus tachycardia, normal stress conduction, no stress  arrhythmias and nonspecific ST-T changes.   2. The overall quality of the study is good. There is no evidence of abnormal lung activity. Stress and rest SPECT images demonstrate homogeneous tracer distribution throughout the myocardium. LV cavity is dilated on both rest and stress images. Gated SPECT imaging reveals global decrease in myocardial thickening and wall motion. The left ventricular ejection fraction was reduced at 29%.   3. High risk study due to reduced LVEF. No evidence of ischemia/ infarction seen.  Echocardiogram 03/19/2019: Left ventricle cavity is normal in size. Moderate concentric hypertrophy of the left ventricle. Normal global wall motion. Low normal LV systolic  function with visual EF 50-55%. Normal diastolic filling pattern.  No significant valvular abnormalities. Compared to previous study on 09/01/2018, there is significant improvement in LVEF, mitral regurgitation not appreciated.   EKG  EKG normal sinus rhythm at the rate of 84 bpm, LAE, normal axis, poor R wave progression, cannot exclude anteroseptal infarct old.  No evidence of ischemia, otherwise normal EKG. No significant change from 06/18/2019   Assessment     ICD-10-CM   1. Non-ischemic cardiomyopathy (HCC)  I42.8 EKG 12-Lead    PCV ECHOCARDIOGRAM COMPLETE  2. Chronic diastolic (congestive) heart failure (HCC)  I50.32 Brain natriuretic peptide  3. Essential hypertension  I10 CBC    CMP14+EGFR    Lipid Panel With LDL/HDL Ratio    TSH  4. Class 2 severe obesity due to excess calories with serious comorbidity and body mass index (BMI) of 38.0 to 38.9 in adult ([place])  E66.01    Z68.38   5. Chronic systolic (congestive) heart failure (HCC)  I50.22     Meds ordered this encounter  Medications  . chlorthalidone (HYGROTON) 25 MG tablet    Sig: Take 1 tablet (25 mg total) by mouth every morning.    Dispense:  30 tablet    Refill:  2    There are no discontinued medications.  Recommendations:   Ms.  Nemetz is a 49 y.o.  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 On aggressive medical therapy her ejection fraction is improved  To normal by recent echocardiogram In August 2020.  She is on Entresto to maximum dose along with carvedilol to maximum dose. No clinical evidence of heart failure, although her blood pressure is very much uncontrolled today.  I have added chlorthalidone 25 mg every day, I'll like to obtain CMP, CBC, TSH, lipid profile testing as it has been at least greater than a year since prior labs.  I'll also repeat echocardiogram to reevaluate LV systolic function.  Weight loss again discussed with the patient.  I'd like to see her back in 2 months for follow-up of labs and also hypertension.  Adrian Prows, MD, [facility] 12/16/2019, 4:15 PM [facility] Pager: [phone] Office: [phone]

## 2019-12-16 ENCOUNTER — Other Ambulatory Visit: Payer: Self-pay

## 2019-12-16 ENCOUNTER — Ambulatory Visit (INDEPENDENT_AMBULATORY_CARE_PROVIDER_SITE_OTHER): Payer: BC Managed Care – PPO | Admitting: Cardiology

## 2019-12-16 ENCOUNTER — Encounter: Payer: Self-pay | Admitting: Cardiology

## 2019-12-16 VITALS — BP 158/110 | HR 91 | Temp 98.3°F | Resp 16 | Ht 64.0 in | Wt 234.6 lb

## 2019-12-16 DIAGNOSIS — I5032 Chronic diastolic (congestive) heart failure: Secondary | ICD-10-CM | POA: Diagnosis not present

## 2019-12-16 DIAGNOSIS — I10 Essential (primary) hypertension: Secondary | ICD-10-CM | POA: Diagnosis not present

## 2019-12-16 DIAGNOSIS — I428 Other cardiomyopathies: Secondary | ICD-10-CM | POA: Diagnosis not present

## 2019-12-16 DIAGNOSIS — I5022 Chronic systolic (congestive) heart failure: Secondary | ICD-10-CM

## 2019-12-16 MED ORDER — CHLORTHALIDONE 25 MG PO TABS: 25.0000 mg | ORAL_TABLET | ORAL | 2 refills | Status: DC

## 2019-12-23 ENCOUNTER — Other Ambulatory Visit: Payer: Self-pay

## 2019-12-23 ENCOUNTER — Ambulatory Visit: Payer: Managed Care, Other (non HMO)

## 2019-12-23 DIAGNOSIS — I428 Other cardiomyopathies: Secondary | ICD-10-CM | POA: Diagnosis not present

## 2019-12-30 DIAGNOSIS — L659 Nonscarring hair loss, unspecified: Secondary | ICD-10-CM | POA: Diagnosis not present

## 2020-02-10 DIAGNOSIS — Z1159 Encounter for screening for other viral diseases: Secondary | ICD-10-CM | POA: Diagnosis not present

## 2020-02-11 ENCOUNTER — Telehealth: Payer: Self-pay

## 2020-02-11 DIAGNOSIS — K219 Gastro-esophageal reflux disease without esophagitis: Secondary | ICD-10-CM | POA: Diagnosis not present

## 2020-02-11 DIAGNOSIS — R0789 Other chest pain: Secondary | ICD-10-CM | POA: Diagnosis not present

## 2020-02-11 NOTE — Telephone Encounter (Signed)
Pt called and requested for you to give her a call back. Pt mention she has not been sleeping while due to her not feeling well.

## 2020-02-11 NOTE — Telephone Encounter (Signed)
Patient called me and stated that over the past 2 weeks or more, she has been having episodes of heaviness in the chest, sometimes exertional sometimes at night, finds it difficult to sleep and find a appropriate sleeping position.  Denies any dyspnea, no PND or orthopnea.  But she is concerned about her symptoms.  I have recommended that she set up an appointment to see me in the office to evaluate her exertional chest discomfort but also has atypical qualities.  Some symptoms are also indicator of GERD.  Advised to take OTC Prilosec 20 mg twice daily for 1 week followed by 20 mg once a day.  If symptoms persist to call me back or to go to the ED.  This was a 15-minute telephone encounter.   Yates Decamp, MD, [facility] 02/11/2020, 4:23 PM Office: [phone]

## 2020-02-15 ENCOUNTER — Ambulatory Visit: Payer: Managed Care, Other (non HMO) | Admitting: Cardiology

## 2020-02-15 DIAGNOSIS — D124 Benign neoplasm of descending colon: Secondary | ICD-10-CM | POA: Diagnosis not present

## 2020-02-15 DIAGNOSIS — K648 Other hemorrhoids: Secondary | ICD-10-CM | POA: Diagnosis not present

## 2020-02-15 DIAGNOSIS — D123 Benign neoplasm of transverse colon: Secondary | ICD-10-CM | POA: Diagnosis not present

## 2020-02-15 DIAGNOSIS — Z1211 Encounter for screening for malignant neoplasm of colon: Secondary | ICD-10-CM | POA: Diagnosis not present

## 2020-02-15 DIAGNOSIS — K644 Residual hemorrhoidal skin tags: Secondary | ICD-10-CM | POA: Diagnosis not present

## 2020-02-15 DIAGNOSIS — K621 Rectal polyp: Secondary | ICD-10-CM | POA: Diagnosis not present

## 2020-02-16 ENCOUNTER — Other Ambulatory Visit: Payer: Self-pay

## 2020-02-16 ENCOUNTER — Encounter: Payer: Self-pay | Admitting: Cardiology

## 2020-02-16 ENCOUNTER — Ambulatory Visit: Payer: BC Managed Care – PPO | Admitting: Cardiology

## 2020-02-16 VITALS — BP 120/80 | HR 95 | Resp 15 | Ht 64.0 in | Wt 236.0 lb

## 2020-02-16 DIAGNOSIS — I10 Essential (primary) hypertension: Secondary | ICD-10-CM | POA: Diagnosis not present

## 2020-02-16 DIAGNOSIS — I428 Other cardiomyopathies: Secondary | ICD-10-CM

## 2020-02-16 DIAGNOSIS — R0789 Other chest pain: Secondary | ICD-10-CM

## 2020-02-16 DIAGNOSIS — K219 Gastro-esophageal reflux disease without esophagitis: Secondary | ICD-10-CM

## 2020-02-16 MED ORDER — OMEPRAZOLE 20 MG PO CPDR: 20.0000 mg | DELAYED_RELEASE_CAPSULE | Freq: Every day | ORAL | 2 refills | Status: DC

## 2020-02-16 MED ORDER — NITROGLYCERIN 0.4 MG SL SUBL: 0.4000 mg | SUBLINGUAL_TABLET | SUBLINGUAL | 0 refills | Status: AC | PRN

## 2020-02-16 NOTE — Progress Notes (Signed)
Primary Physician/Referring:  Laurann Montana, MD  Patient ID: Sydney Jackson, female    DOB: Mar 09, 1971, 49 y.o.   MRN: [id number]  Chief Complaint  Patient presents with  . Follow-up    1 month  . Chest Pain   HPI:    Sydney Jackson  is a 49 y.o. AAF patient with hypertension, history of SVT status post ablation at age 50-25 years in [place], [place], new diagnosis of cardiomyopathy diagnosed in Jan 2020 with EF 25-30%, grade 2 diastolic dysfunction, grade 2 mitral regurgitation, nonsustained VT seen on event monitor with normal perfusion with low EF by nuclear stress in March 2020. She is on Entresto to maximum dose along with carvedilol to maximum dose.   Patient called me on 02/11/2020 and stated that over the past 2 weeks or more, she has been having episodes of heaviness in the chest, sometimes exertional sometimes at night, finds it difficult to sleep and find a appropriate sleeping position.  Denies any dyspnea, no PND or orthopnea.  But she is concerned about her symptoms.   Past Medical History:  Diagnosis Date  . Chronic systolic (congestive) heart failure (HCC) 10/14/2018   Echocardiogram 03/19/2019: Low normal LV systolic function with visual EF 50-55%.  . Depression   . Herpes simplex disease   . History of colonoscopy 7/6/32021  . History of syncope    s/p  heart valve repair  1995  . Hypertension   . NSVT (nonsustained ventricular tachycardia) (HCC)   . Palpitations   . Right ACL tear   . Right knee meniscal tear    Past Surgical History:  Procedure Laterality Date  . ABLATION     SVT at age 32 years  . KNEE ARTHROSCOPY Right 01/21/2014   Procedure: RIGHT  KNEE ARTHROSCOPY WITH DEBRIDEMENT PARTIAL MENISECTOMY AND AUTOGRAPH ACL RECONSTRUCTION;  Surgeon: Eugenia Mcalpine, MD;  Location: [facility] ;  Service: Orthopedics;  Laterality: Right;  . KNEE ARTHROSCOPY N/A 01/2014  . KNEE REPAIR EXTENSOR MECHANISM  2015  . RETINAL DETACHMENT REPAIR W/ SCLERAL  BUCKLE LE Left 08-27-2006  . SVT ABLATION  1995 (approx.  age 22's)   Family History  Problem Relation Age of Onset  . Hypertension Mother   . Diabetes Father   . Hypertension Brother   . Congestive Heart Failure Sister     Social History   Tobacco Use  . Smoking status: Former Smoker    Types: Cigarettes    Quit date: 2010    Years since quitting: 11.5  . Smokeless tobacco: Never Used  . Tobacco comment: social smoker  Substance Use Topics  . Alcohol use: Yes    Comment: Glass or two of wine on occasion, past history of heavy use    Marital Status: Single   ROS  Review of Systems  Constitutional: Negative for weight gain.  Cardiovascular: Positive for chest pain. Negative for dyspnea on exertion, leg swelling and syncope.  Respiratory: Negative for shortness of breath.   Musculoskeletal: Negative for joint swelling.   Objective  Blood pressure 120/80, pulse 95, resp. rate 15, height 5\' 4"  (1.626 m), weight 236 lb (107 kg), SpO2 98 %.  Vitals with BMI 02/16/2020 12/16/2019 12/16/2019  Height 5\' 4"  - 5\' 4"   Weight 236 lbs - 234 lbs 10 oz  BMI 40.49 - 40.25  Systolic 120 158 02/15/2020  Diastolic 80 110 112  Pulse 95 91 68     Physical Exam Constitutional:      Appearance:  She is well-developed.     Comments: Morbidly obese   Cardiovascular:     Rate and Rhythm: Normal rate and regular rhythm.     Pulses: Intact distal pulses.     Heart sounds: No murmur heard.  No gallop.      Comments: No leg edema. No JVD.   Pulmonary:     Effort: Pulmonary effort is normal. No accessory muscle usage or respiratory distress.     Breath sounds: Normal breath sounds.  Abdominal:     General: Bowel sounds are normal.     Palpations: Abdomen is soft.    Laboratory examination:   No results for input(s): NA, K, CL, CO2, GLUCOSE, BUN, CREATININE, CALCIUM, GFRNONAA, GFRAA in the last 8760 hours. CrCl cannot be calculated (Patient's most recent lab result is older than the maximum 21 days  allowed.).  CMP Latest Ref Rng & Units 02/02/2019 12/12/2018 10/24/2018  Glucose 70 - 99 mg/dL 98 443(X) 95  BUN 6 - 20 mg/dL 20 6 10   Creatinine 0.44 - 1.00 mg/dL 5.40 0.86)  Sodium 135 - 145 mmol/L 138 137 134(L)  Potassium 3.5 - 5.1 mmol/L 3.7 3.6 4.4  Chloride 98 - 111 mmol/L 105 99 101  CO2 22 - 32 mmol/L 22 25 22   Calcium 8.9 - 10.3 mg/dL 7.61(P) 9.2 )  Total Protein 6.5 - 8.1 g/dL - 7.3 7.2  Total Bilirubin 0.3 - 1.2 mg/dL - 0.5 0.4  Alkaline Phos 38 - 126 U/L - 62 47  AST 15 - 41 U/L - 14(L) 18  ALT 0 - 44 U/L - 12 16   CBC Latest Ref Rng & Units 02/02/2019 12/12/2018 10/24/2018  WBC 4.0 - 10.5 K/uL 6.2 7.2 4.3  Hemoglobin 12.0 - 15.0 g/dL 11.7(L) 12.0 11.6(L)  Hematocrit 36 - 46 % 37.6 38.9 39.4  Platelets 150 - 400 K/uL 384 450(H) 334   Lipid Panel  No results found for: CHOL, TRIG, HDL, CHOLHDL, VLDL, LDLCALC, LDLDIRECT HEMOGLOBIN A1C No results found for: HGBA1C, MPG TSH No results for input(s): TSH in the last 8760 hours.  External labs:   10/09/2016: Chol 140, Trig 51, HDL 57, LDL 73.  Medications and allergies  No Known Allergies   Current Outpatient Medications  Medication Instructions  . albuterol (VENTOLIN HFA) 108 (90 Base) MCG/ACT inhaler 1 puff, Inhalation, Every 6 hours PRN  . carvedilol (COREG) 37.5 mg, Oral, 2 times daily  . chlorthalidone (HYGROTON) 25 mg, Oral, BH-each morning  . nitroGLYCERIN (NITROSTAT) 0.4 mg, Sublingual, Every 5 min PRN  . omeprazole (PRILOSEC) 20 mg, Oral, Daily before breakfast  . sacubitril-valsartan (ENTRESTO) 97-103 MG 1 tablet, Oral, 2 times daily  . spironolactone (ALDACTONE) 50 mg, Oral, Daily   Radiology:   No results found.  Cardiac Studies:   Event Monitor for 30 days: Critical event report 09/05/2018 at 1115: NSR with 12 beats of VT that was auto-triggered. No reported symptoms- Nuclear stress ordered  Lexiscan Sestamibi stress test 10/19/2018: 1. Lexiscan stress test was performed. Exercise capacity  was not assessed. Resting blood pressure was 170/100 mmHg and peak effect blood pressure was 160/90 mmHg. Stress EKG is non diagnostic for ischemia as it is a pharmacologic stress. In addition, the stress electrocardiogram showed sinus tachycardia, normal stress conduction, no stress arrhythmias and nonspecific ST-T changes.   2. The overall quality of the study is good. There is no evidence of abnormal lung activity. Stress and rest SPECT images demonstrate homogeneous tracer distribution throughout the myocardium. LV  cavity is dilated on both rest and stress images. Gated SPECT imaging reveals global decrease in myocardial thickening and wall motion. The left ventricular ejection fraction was reduced at 29%.   3. High risk study due to reduced LVEF. No evidence of ischemia/ infarction seen.  Echocardiogram 12/23/2019:  Left ventricle cavity is normal in size. Moderate concentric hypertrophy  of the left ventricle. Normal global wall motion. Normal LV systolic  function with EF 50-55%. Doppler evidence of grade I (impaired) diastolic  dysfunction, normal LAP.  No significant valvular abnormality.  Normal right atrial pressure.  No significant change compared to previous study on 03/19/2019.  EKG  EKG normal sinus rhythm at the rate of 84 bpm, LAE, normal axis, poor R wave progression, cannot exclude anteroseptal infarct old.  No evidence of ischemia, otherwise normal EKG. No significant change from 06/18/2019   Assessment     ICD-10-CM   1. Atypical chest pain  R07.89 nitroGLYCERIN (NITROSTAT) 0.4 MG SL tablet  2. Gastroesophageal reflux disease without esophagitis  K21.9 omeprazole (PRILOSEC) 20 MG capsule  3. Essential hypertension  I10   4. Non-ischemic cardiomyopathy (HCC)  I42.8     Meds ordered this encounter  Medications  . omeprazole (PRILOSEC) 20 MG capsule    Sig: Take 1 capsule (20 mg total) by mouth daily before breakfast.    Dispense:  30 capsule    Refill:  2  . nitroGLYCERIN  (NITROSTAT) 0.4 MG SL tablet    Sig: Place 1 tablet (0.4 mg total) under the tongue every 5 (five) minutes as needed for chest pain (CP or SOB).    Dispense:  25 tablet    Refill:  0    Medications Discontinued During This Encounter  Medication Reason  . nitroGLYCERIN (NITROSTAT) 0.4 MG SL tablet Reorder  . sertraline (ZOLOFT) 100 MG tablet Patient Preference    Recommendations:   Sydney Jackson is a 49 y.o.  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 On aggressive medical therapy her ejection fraction is improved  To normal by recent echocardiogram In August 2020.  Patient called me on 02/11/2020 and stated that over the past 2 weeks or more, she has been having episodes of heaviness in the chest, sometimes exertional sometimes at night, finds it difficult to sleep and find a appropriate sleeping position.   Her symptoms of chest pain are at most atypical for angina, she had not started PPI, I have sent in a prescription.  I also sent in a prescription for sublingual nitroglycerin, at this point watchful waiting is indicated in view of negative nuclear stress test and normal LVEF by echocardiogram.  However if she continues to have recurrent episodes of chest pain, I will consider either coronary CT angiogram or even left heart catheterization.  With regard to cardiomyopathy, LVEF is improved and she is presently on maximum dose of Entresto and also tolerating carvedilol.  No clinical evidence of heart failure.  She has got morbid obesity, weight loss discussed with the patient.  On her last office visit about 2 months ago, I placed orders for labs including lipid profile testing and BNP and CBC and CMP, encouraged her to obtain this.   Yates Decamp, MD, [facility] 02/16/2020, 3:26 PM Office: [phone]

## 2020-03-03 ENCOUNTER — Ambulatory Visit: Payer: Managed Care, Other (non HMO) | Admitting: Cardiology

## 2020-04-09 ENCOUNTER — Other Ambulatory Visit: Payer: Self-pay | Admitting: Cardiology

## 2020-05-02 ENCOUNTER — Other Ambulatory Visit: Payer: Self-pay

## 2020-05-02 DIAGNOSIS — I10 Essential (primary) hypertension: Secondary | ICD-10-CM

## 2020-05-02 MED ORDER — SPIRONOLACTONE 50 MG PO TABS: 50.0000 mg | ORAL_TABLET | Freq: Every day | ORAL | 1 refills | Status: DC

## 2020-05-14 ENCOUNTER — Other Ambulatory Visit: Payer: Self-pay | Admitting: Cardiology

## 2020-05-14 DIAGNOSIS — K219 Gastro-esophageal reflux disease without esophagitis: Secondary | ICD-10-CM

## 2020-08-18 ENCOUNTER — Ambulatory Visit: Payer: BC Managed Care – PPO | Admitting: Cardiology

## 2020-08-21 ENCOUNTER — Other Ambulatory Visit: Payer: Self-pay

## 2020-08-21 ENCOUNTER — Encounter: Payer: Self-pay | Admitting: Cardiology

## 2020-08-21 ENCOUNTER — Ambulatory Visit: Payer: 59 | Admitting: Cardiology

## 2020-08-21 VITALS — BP 148/84 | HR 77 | Resp 14 | Ht 64.0 in | Wt 245.0 lb

## 2020-08-21 DIAGNOSIS — F419 Anxiety disorder, unspecified: Secondary | ICD-10-CM

## 2020-08-21 DIAGNOSIS — I428 Other cardiomyopathies: Secondary | ICD-10-CM

## 2020-08-21 DIAGNOSIS — I5022 Chronic systolic (congestive) heart failure: Secondary | ICD-10-CM

## 2020-08-21 DIAGNOSIS — R5383 Other fatigue: Secondary | ICD-10-CM

## 2020-08-21 DIAGNOSIS — I10 Essential (primary) hypertension: Secondary | ICD-10-CM

## 2020-08-21 DIAGNOSIS — R0789 Other chest pain: Secondary | ICD-10-CM

## 2020-08-21 MED ORDER — CHLORTHALIDONE 25 MG PO TABS: ORAL_TABLET | ORAL | 3 refills | Status: DC

## 2020-08-21 MED ORDER — ENTRESTO 97-103 MG PO TABS: 1.0000 | ORAL_TABLET | Freq: Two times a day (BID) | ORAL | 3 refills | Status: DC

## 2020-08-21 MED ORDER — CARVEDILOL 25 MG PO TABS: 37.5000 mg | ORAL_TABLET | Freq: Two times a day (BID) | ORAL | 3 refills | Status: DC

## 2020-08-21 MED ORDER — SPIRONOLACTONE 50 MG PO TABS: 50.0000 mg | ORAL_TABLET | Freq: Every day | ORAL | 3 refills | Status: DC

## 2020-08-21 NOTE — Patient Instructions (Addendum)
Blood work at [facility] in 1 week  

## 2020-08-21 NOTE — Progress Notes (Signed)
Primary Physician/Referring:  Laurann Montana, MD  Patient ID: Sydney Jackson, female    DOB: 01-29-1971, 50 y.o.   MRN: [id number]  Chief Complaint  Patient presents with  . Chest Pain  . Cardiomyopathy  . Follow-up    6 month   HPI:    Sydney Jackson  is a 50 y.o. AAF patient with hypertension, history of SVT status post ablation at age 64-25 years in [place], [place], new diagnosis of cardiomyopathy diagnosed in Jan 2020 with EF 25-30%, grade 2 diastolic dysfunction, grade 2 mitral regurgitation, nonsustained VT seen on event monitor with normal perfusion with low EF by nuclear stress in March 2020. She is on Entresto to maximum dose along with carvedilol to maximum dose.   Patient presents for 6 month follow up. She was last seen in the office 02/16/2020 with complaints of atypical chest pain. At last visit started PPI and prescribe as needed sublingual nitroglycerin. Patient has had no recurrence of chest pain, and has not taken sublingual nitroglycerin.  Patient reports during the month of December she had several life stressors including her mother getting sick and the death of her close to her.  She was without her medications for 2 weeks while traveling to take care of her mother, she returned to town yesterday and has now taken her medications yesterday and today, with the exception of Entresto as she is out of it.  Patient denies chest pain, dyspnea, dizziness, syncope, near syncope, orthopnea, PND.  She does report some mild bilateral lower leg edema, which got worse while she was without her medications.  Patient states she had previously tried to schedule appointment with [facility] for evaluation of difficulty sleeping and fatigue, however she has not been seen by them.  She also states she was previously being seen by behavioral health for anxiety, and wishes to reestablish care with them at this time in view of her recent life stressors.  Past Medical History:   Diagnosis Date  . Chronic systolic (congestive) heart failure (HCC) 10/14/2018   Echocardiogram 03/19/2019: Low normal LV systolic function with visual EF 50-55%.  . Depression   . Herpes simplex disease   . History of colonoscopy 7/6/32021  . History of syncope    s/p  heart valve repair  50  . Hypertension   . NSVT (nonsustained ventricular tachycardia) (HCC)   . Palpitations   . Right ACL tear   . Right knee meniscal tear    Past Surgical History:  Procedure Laterality Date  . ABLATION     SVT at age 16 years  . KNEE ARTHROSCOPY Right 01/21/2014   Procedure: RIGHT  KNEE ARTHROSCOPY WITH DEBRIDEMENT PARTIAL MENISECTOMY AND AUTOGRAPH ACL RECONSTRUCTION;  Surgeon: Eugenia Mcalpine, MD;  Location: [facility] ;  Service: Orthopedics;  Laterality: Right;  . KNEE ARTHROSCOPY N/A 01/2014  . KNEE REPAIR EXTENSOR MECHANISM  2015  . RETINAL DETACHMENT REPAIR W/ SCLERAL BUCKLE LE Left 08-27-2006  . SVT ABLATION  1995 (approx.  age 7's)   Family History  Problem Relation Age of Onset  . Hypertension Mother   . Diabetes Father   . Hypertension Brother   . Congestive Heart Failure Sister     Social History   Tobacco Use  . Smoking status: Former Smoker    Types: Cigarettes    Quit date: 2010    Years since quitting: 12.0  . Smokeless tobacco: Never Used  . Tobacco comment: social smoker  Substance Use Topics  .  Alcohol use: Yes    Comment: Glass or two of wine on occasion, past history of heavy use    Marital Status: Single   ROS  Review of Systems  Constitutional: Positive for malaise/fatigue. Negative for weight gain.  Cardiovascular: Positive for leg swelling. Negative for chest pain, claudication, dyspnea on exertion, near-syncope, orthopnea, palpitations, paroxysmal nocturnal dyspnea and syncope.  Respiratory: Negative for shortness of breath.   Hematologic/Lymphatic: Does not bruise/bleed easily.  Musculoskeletal: Negative for joint swelling.   Gastrointestinal: Negative for melena.  Neurological: Negative for dizziness and weakness.   Objective  Blood pressure (!) 148/84, pulse 77, resp. rate 14, height 5\' 4"  (1.626 m), weight 245 lb (111.1 kg), SpO2 98 %.  Vitals with BMI 08/21/2020 08/21/2020 08/21/2020  Height - - 5\' 4"   Weight - - 245 lbs  BMI - - 42.03  Systolic 148 179 10/19/2020  Diastolic 84 116 114  Pulse - 77 77     Physical Exam Vitals reviewed.  Constitutional:      Appearance: She is well-developed.     Comments: Morbidly obese   HENT:     Head: Normocephalic and atraumatic.  Cardiovascular:     Rate and Rhythm: Normal rate and regular rhythm.     Pulses: Intact distal pulses.     Heart sounds: S1 normal and S2 normal. No murmur heard. No gallop.      Comments: No leg edema. No JVD.   Pulmonary:     Effort: Pulmonary effort is normal. No accessory muscle usage or respiratory distress.     Breath sounds: Normal breath sounds. No wheezing, rhonchi or rales.  Abdominal:     General: Bowel sounds are normal.     Palpations: Abdomen is soft.  Musculoskeletal:     Right lower leg: Edema (Trace) present.     Left lower leg: Edema (Trace) present.  Neurological:     Mental Status: She is alert.    Laboratory examination:   No results for input(s): NA, K, CL, CO2, GLUCOSE, BUN, CREATININE, CALCIUM, GFRNONAA, GFRAA in the last 8760 hours. CrCl cannot be calculated (Patient's most recent lab result is older than the maximum 21 days allowed.).  CMP Latest Ref Rng & Units 02/02/2019 12/12/2018 10/24/2018  Glucose 70 - 99 mg/dL 98 02/11/2019) 95  BUN 6 - 20 mg/dL 20 6 10   Creatinine 0.44 - 1.00 mg/dL 10/26/2018 935(T )  Sodium 135 - 145 mmol/L 138 137 134(L)  Potassium 3.5 - 5.1 mmol/L 3.7 3.6 4.4  Chloride 98 - 111 mmol/L 105 99 101  CO2 22 - 32 mmol/L 22 25 22   Calcium 8.9 - 10.3 mg/dL 0.17) 9.2 7.93)  Total Protein 6.5 - 8.1 g/dL - 7.3 7.2  Total Bilirubin 0.3 - 1.2 mg/dL - 0.5 0.4  Alkaline Phos 38 - 126 U/L - 62  47  AST 15 - 41 U/L - 14(L) 18  ALT 0 - 44 U/L - 12 16   CBC Latest Ref Rng & Units 02/02/2019 12/12/2018 10/24/2018  WBC 4.0 - 10.5 K/uL 6.2 7.2 4.3  Hemoglobin 12.0 - 15.0 g/dL 11.7(L) 12.0 11.6(L)  Hematocrit 36.0 - 46.0 % 37.6 38.9 39.4  Platelets 150 - 400 K/uL 384 450(H) 334   Lipid Panel  No results found for: CHOL, TRIG, HDL, CHOLHDL, VLDL, LDLCALC, LDLDIRECT HEMOGLOBIN A1C No results found for: HGBA1C, MPG TSH No results for input(s): TSH in the last 8760 hours.  External labs:  08/23/2019: HDL 59, LDL 119, total cholesterol 195,  triglycerides 93  05/06/2019: Glucose 88, BUN 16, serum creatinine 0.90 TSH 1.44  10/09/2016: Chol 140, Trig 51, HDL 57, LDL 73.  Medications and allergies  No Known Allergies   Current Outpatient Medications  Medication Instructions  . albuterol (VENTOLIN HFA) 108 (90 Base) MCG/ACT inhaler 1 puff, Inhalation, Every 6 hours PRN  . carvedilol (COREG) 37.5 mg, Oral, 2 times daily  . chlorthalidone (HYGROTON) 25 MG tablet TAKE 1 TABLET BY MOUTH EVERY DAY IN THE MORNING  . Cyanocobalamin (VITAMIN B-12) 3000 MCG/ML LIQD No dose, route, or frequency recorded.  Marland Kitchen ibuprofen (ADVIL) 800 MG tablet 1 tablet with food or milk  . nitroGLYCERIN (NITROSTAT) 0.4 mg, Sublingual, Every 5 min PRN  . omeprazole (PRILOSEC) 20 mg, Oral, Daily before breakfast  . sacubitril-valsartan (ENTRESTO) 97-103 MG 1 tablet, Oral, 2 times daily  . spironolactone (ALDACTONE) 50 mg, Oral, Daily   Radiology:   No results found.  Cardiac Studies:   Event Monitor for 30 days: Critical event report 09/05/2018 at 1115: NSR with 12 beats of VT that was auto-triggered. No reported symptoms- Nuclear stress ordered  Lexiscan Sestamibi stress test 10/19/2018: 1. Lexiscan stress test was performed. Exercise capacity was not assessed. Resting blood pressure was 170/100 mmHg and peak effect blood pressure was 160/90 mmHg. Stress EKG is non diagnostic for ischemia as it is a  pharmacologic stress. In addition, the stress electrocardiogram showed sinus tachycardia, normal stress conduction, no stress arrhythmias and nonspecific ST-T changes.   2. The overall quality of the study is good. There is no evidence of abnormal lung activity. Stress and rest SPECT images demonstrate homogeneous tracer distribution throughout the myocardium. LV cavity is dilated on both rest and stress images. Gated SPECT imaging reveals global decrease in myocardial thickening and wall motion. The left ventricular ejection fraction was reduced at 29%.   3. High risk study due to reduced LVEF. No evidence of ischemia/ infarction seen.  Echocardiogram 12/23/2019:  Left ventricle cavity is normal in size. Moderate concentric hypertrophy  of the left ventricle. Normal global wall motion. Normal LV systolic  function with EF 50-55%. Doppler evidence of grade I (impaired) diastolic  dysfunction, normal LAP.  No significant valvular abnormality.  Normal right atrial pressure.  No significant change compared to previous study on 03/19/2019.   EKG   EKG 08/21/2020: Sinus rhythm at a rate of 72 bpm, left atrial enlargement.  Normal axis.  Poor progression, cannot exclude anteroseptal infarct old. No evidence of ischemia or injury.  No significant change compared to EKG 12/16/2019.  EKG normal sinus rhythm at the rate of 84 bpm, LAE, normal axis, poor R wave progression, cannot exclude anteroseptal infarct old.  No evidence of ischemia, otherwise normal EKG. No significant change from 06/18/2019   Assessment     ICD-10-CM   1. Essential hypertension  I10 EKG 12-Lead    spironolactone (ALDACTONE) 50 MG tablet  2. Non-ischemic cardiomyopathy (HCC)  I42.8 carvedilol (COREG) 25 MG tablet    sacubitril-valsartan (ENTRESTO) 97-103 MG  3. Atypical chest pain  R07.89   4. Chronic systolic (congestive) heart failure (HCC)  I50.22 carvedilol (COREG) 25 MG tablet    sacubitril-valsartan (ENTRESTO) 97-103 MG  5.  Other fatigue  R53.83 Ambulatory referral to Sleep Studies  6. Anxiety  F41.9 Ambulatory referral to Psychiatry    Meds ordered this encounter  Medications  . carvedilol (COREG) 25 MG tablet    Sig: Take 1.5 tablets (37.5 mg total) by mouth 2 (two) times daily.  Dispense:  270 tablet    Refill:  3  . chlorthalidone (HYGROTON) 25 MG tablet    Sig: TAKE 1 TABLET BY MOUTH EVERY DAY IN THE MORNING    Dispense:  90 tablet    Refill:  3  . sacubitril-valsartan (ENTRESTO) 97-103 MG    Sig: Take 1 tablet by mouth 2 (two) times daily.    Dispense:  180 tablet    Refill:  3  . spironolactone (ALDACTONE) 50 MG tablet    Sig: Take 1 tablet (50 mg total) by mouth daily.    Dispense:  90 tablet    Refill:  3    Medications Discontinued During This Encounter  Medication Reason  . sacubitril-valsartan (ENTRESTO) 97-103 MG Reorder  . carvedilol (COREG) 25 MG tablet Reorder  . chlorthalidone (HYGROTON) 25 MG tablet Reorder  . spironolactone (ALDACTONE) 50 MG tablet Reorder    Recommendations:   Sydney Jackson is a 50 y.o.  AAF patient with hypertension, history of SVT status post ablation at age 40-25 years in [place], [place], new diagnosis of cardiomyopathy diagnosed in Jan 2020 with EF 25-30%, grade 2 diastolic dysfunction, grade 2 mitral regurgitation, nonsustained VT seen on event monitor with normal perfusion with low EF by nuclear stress in March 2020. LVEF improved to 50-55% via echocardiogram 12/2019. She is on Entresto to maximum dose along with carvedilol to maximum dose.   Patient presents for 6 month follow up. She is doing well from a cardiovascular standpoint without clinical signs of heart failure and no recurrence of chest pain since last visit. However, patient's blood pressure elevated in the office today, likely because she has been without her medications for the previous 2 weeks and is still without Entresto. I have refilled all cardiovascular medications, including Entresto. Will  continue guideline directed medical therapy for cardiomyopathy and heart failure. Will obtain repeat BMP in 1 week. Patient has not had recent lab work done, will also obtain lipid profile testing, BNP, CBC, and TSH.   In regard to previously reported atypical chest pain advised patient if she does have recurrence of symptoms could consider CT angiogram or left heart catheterization.  In regard to patient's difficulty sleeping and fatigue she does have risk factors for sleep apnea, but has not completed sleep study. Will refer for evaluation by [facility]. Patient also requests that I refer her to psychiatry to reestablish care for anxiety.   Discussed with patient regarding diet and lifestyle modifications as well as weight loss.   Follow up in 6 weeks for hypertension and lab results.    Rayford Halsted, PA-C 08/21/2020, 4:12 PM Office: [phone]

## 2020-08-23 ENCOUNTER — Other Ambulatory Visit: Payer: Self-pay

## 2020-08-23 ENCOUNTER — Encounter (HOSPITAL_COMMUNITY): Payer: Self-pay

## 2020-08-23 ENCOUNTER — Emergency Department (HOSPITAL_COMMUNITY)
Admission: EM | Admit: 2020-08-23 | Discharge: 2020-08-23 | Disposition: A | Discharge status: Home / Self Care | Payer: 59 | Attending: Emergency Medicine | Admitting: Emergency Medicine

## 2020-08-23 DIAGNOSIS — H538 Other visual disturbances: Secondary | ICD-10-CM | POA: Insufficient documentation

## 2020-08-23 DIAGNOSIS — Z5321 Procedure and treatment not carried out due to patient leaving prior to being seen by health care provider: Secondary | ICD-10-CM | POA: Insufficient documentation

## 2020-08-23 DIAGNOSIS — I10 Essential (primary) hypertension: Secondary | ICD-10-CM | POA: Insufficient documentation

## 2020-08-23 DIAGNOSIS — R42 Dizziness and giddiness: Secondary | ICD-10-CM | POA: Diagnosis not present

## 2020-08-23 LAB — BASIC METABOLIC PANEL
Anion gap: 10 (ref 5–15)
BUN: 15 mg/dL (ref 6–20)
CO2: 26 mmol/L (ref 22–32)
Calcium: 8.8 mg/dL — ABNORMAL LOW (ref 8.9–10.3)
Chloride: 100 mmol/L (ref 98–111)
Creatinine, Ser: 1.18 mg/dL — ABNORMAL HIGH (ref 0.44–1.00)
GFR, Estimated: 56 mL/min — ABNORMAL LOW (ref >60)
Glucose, Bld: 108 mg/dL — ABNORMAL HIGH (ref 70–99)
Potassium: 3.4 mmol/L — ABNORMAL LOW (ref 3.5–5.1)
Sodium: 136 mmol/L (ref 135–145)

## 2020-08-23 LAB — CBC
HCT: 41 % (ref 36.0–46.0)
Hemoglobin: 13.2 g/dL (ref 12.0–15.0)
MCH: 27.3 pg (ref 26.0–34.0)
MCHC: 32.2 g/dL (ref 30.0–36.0)
MCV: 84.9 fL (ref 80.0–100.0)
Platelets: 439 10*3/uL — ABNORMAL HIGH (ref 150–400)
RBC: 4.83 MIL/uL (ref 3.87–5.11)
RDW: 15.2 % (ref 11.5–15.5)
WBC: 6 10*3/uL (ref 4.0–10.5)
nRBC: 0 % (ref 0.0–0.2)

## 2020-08-23 LAB — CBG MONITORING, ED: Glucose-Capillary: 114 mg/dL — ABNORMAL HIGH (ref 70–99)

## 2020-08-23 LAB — I-STAT BETA HCG BLOOD, ED (MC, WL, AP ONLY): I-stat hCG, quantitative: <5 m[IU]/mL (ref <5)

## 2020-08-23 NOTE — ED Triage Notes (Signed)
Pt coming in c/o sudden onset of dizziness while driving with blurry vision. No N/V/D, chest pain or shortness of breath. Denies headache. No facial droop and strong bilateral grips. Hx htn

## 2020-08-29 ENCOUNTER — Telehealth (HOSPITAL_COMMUNITY): Payer: Self-pay | Admitting: Licensed Clinical Social Worker

## 2020-09-01 ENCOUNTER — Encounter: Payer: Self-pay | Admitting: Neurology

## 2020-09-01 ENCOUNTER — Ambulatory Visit: Payer: 59 | Admitting: Neurology

## 2020-09-01 VITALS — BP 148/105 | HR 103 | Ht 65.0 in | Wt 235.0 lb

## 2020-09-01 DIAGNOSIS — I472 Ventricular tachycardia: Secondary | ICD-10-CM

## 2020-09-01 DIAGNOSIS — R0683 Snoring: Secondary | ICD-10-CM

## 2020-09-01 DIAGNOSIS — I4729 Other ventricular tachycardia: Secondary | ICD-10-CM

## 2020-09-01 DIAGNOSIS — I5022 Chronic systolic (congestive) heart failure: Secondary | ICD-10-CM

## 2020-09-01 DIAGNOSIS — G478 Other sleep disorders: Secondary | ICD-10-CM

## 2020-09-01 DIAGNOSIS — Z8679 Personal history of other diseases of the circulatory system: Secondary | ICD-10-CM

## 2020-09-01 DIAGNOSIS — I429 Cardiomyopathy, unspecified: Secondary | ICD-10-CM

## 2020-09-01 DIAGNOSIS — G4700 Insomnia, unspecified: Secondary | ICD-10-CM

## 2020-09-01 NOTE — Progress Notes (Signed)
SLEEP MEDICINE CLINIC    Provider:  Melvyn Novas, MD  Primary Care Physician:  Laurann Montana, MD [phone] [address] [place] [address]     Referring Provider: Dr Verl Dicker office. [facility]         Chief Complaint according to patient   Patient presents with:     New Patient (Initial Visit)     Patient has never had a sleep study. Patient does endorse some snoring.      HISTORY OF PRESENT ILLNESS:  Sydney Jackson is a 50  -Year- old African- American female patient and seen here upon cardiology  referral for a sleep  Consultation, on 09/01/2020.  Chief concern according to patient :" I have worked all my life and the last 2 years have been challenging for health and wellness, sleep has been non-restorative, non-refreshing.  She has CHF episode and feels this affected her sleep.  She had a cardiac procedure at age 70 - through angiocatheter  , had previously syncope, palpitations, well into her twenties before this was discovered.    I have the pleasure of seeing Sydney Jackson today, a right-handed  [place] female with a possible sleep disorder.  She  has a past medical history of Chronic systolic (congestive) heart failure (HCC) (10/14/2018), Depression, Herpes simplex disease, History of colonoscopy (7/6/32021), History of syncope, Hypertension, NSVT (nonsustained ventricular tachycardia) (HCC), Palpitations, Right ACL tear, and Right knee meniscal tear..   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 Family status is single  with adult children Tobacco use: I tried smoking, never done.   ETOH use ; wine - never more than 4-5 a month ,  Caffeine intake in form of Coffee(  2- in AM ) Soda( quit ) Tea  ( quit) -no energy drinks. Regular exercise in form of walking, but not this winter.       Sleep habits are as follows: The patient's dinner time is between 6-7 PM, but she often skips. She has zoom conferences late in Pm, often watches TV alone.   The patient goes to bed at 10 PM and continues to sleep for intervals of 2-3  hours, wakes for 2 hours , back to sleep up at  6.30- has only one bathroom break. She feels her sleep fragmented.   The preferred sleep position is on her sides or prone   , with the support of 2 pillows.  Dreams are reportedly rare. 8-10 AM is the usual rise time. The patient wakes up spontaneously.  She reports not feeling refreshed or restored in AM, with symptoms such as dry mouth , morning headaches , and residual fatigue.  Naps are taken frequently, lasting from 30-90 minutes and are more less refreshing than nocturnal sleep.    Review of Systems: Out of a complete 14 system review, the patient complains of only the following symptoms, and all other reviewed systems are negative.:   See dr Verl Dicker report-  Sydney Jackson  is a 50 y.o. AAF patient with hypertension, history of SVT status post ablation at age 45-25 years in [place], [place], new diagnosis of cardiomyopathy diagnosed in Jan 2020 with EF 25-30%,  grade 2 diastolic dysfunction, grade 2 mitral regurgitation, nonsustained VT seen on event monitor with normal perfusion with low EF by nuclear stress in March 2020. She is on Entresto to maximum dose along with carvedilol to maximum dose.   Patient called me on 02/11/2020 and stated that over the past 2 weeks or more, she has been having episodes of heaviness in the chest, sometimes exertional sometimes at night, finds it difficult to sleep and find a appropriate sleeping position.  Denies any dyspnea, no PND or orthopnea.  But she is concerned about her symptoms.  Fatigue, sleepiness , snoring,  fragmented sleep, Insomnia -    How likely are you to doze in the  following situations: 0 = not likely, 1 = slight chance, 2 = moderate chance, 3 = high chance   Sitting and Reading? Watching Television? Sitting inactive in a public place (theater or meeting)? As a passenger in a car for an hour without a break? Lying down in the afternoon when circumstances permit? Sitting and talking to someone? Sitting quietly after lunch without alcohol? In a car, while stopped for a few minutes in traffic?   Total = 10/ 24 points  With naps.   FSS endorsed at 36/ 63 points.   Social History   Socioeconomic History   Marital status: Single    Spouse name: Not on file   Number of children: 4   Years of education: Not on file   Highest education level: Not on file  Occupational History   Occupation: customer service    Employer: [employer]  Tobacco Use   Smoking status: Former Smoker    Types: Cigarettes    Quit date: 2010    Years since quitting: 12.0   Smokeless tobacco: Never Used   Tobacco comment: social smoker  Building services engineer Use: Never used  Substance and Sexual Activity   Alcohol use: Yes    Comment: Glass or two of wine on occasion, past history of heavy use    Drug use: No    Comment: hx   THC use, ended 10+ years ago-- (01-17-2014   DENIES   Sexual activity: Not Currently    Partners: Male  Other Topics Concern   Not on file  Social History Narrative   ** Merged History Encounter **       Sydney Jackson was born and grew up in [place]. She reports that her childhood was "okay." Her biological father left when she was 38 or 64 years of age. She has 7 half-siblings by her father, and one half-sibling by her mother. She graduated high school and attended one year of college. She has never been married. She has 4 children, a 38 year old son, a 43 year old son, a 3 year old daughter, and her 91 year old daughter. Her 10 year old and 67 year old live with her now. 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 She denies any legal problems. She affiliates as a Loss adjuster, chartered. She reports that she has one friend who she uses for social support.   Social Determinants of Health   Financial Resource Strain: Not on file  Food Insecurity: Not on file  Transportation Needs: Not on file  Physical Activity: Not on file  Stress: Not on file  Social Connections: Not on file    Family History  Problem Relation Age of Onset   Hypertension Mother    Diabetes Father    Hypertension Brother  Congestive Heart Failure Sister     Past Medical History:  Diagnosis Date   Chronic systolic (congestive) heart failure (HCC) 10/14/2018   Echocardiogram 03/19/2019: Low normal LV systolic function with visual EF 50-55%.   Depression    Herpes simplex disease    History of colonoscopy 7/6/32021   History of syncope    s/p  heart valve repair  1995   Hypertension    NSVT (nonsustained ventricular tachycardia) (HCC)    Palpitations    Right ACL tear    Right knee meniscal tear     Past Surgical History:  Procedure Laterality Date   ABLATION     SVT at age 23 years   KNEE ARTHROSCOPY Right 01/21/2014   Procedure: RIGHT  KNEE ARTHROSCOPY WITH DEBRIDEMENT PARTIAL MENISECTOMY AND AUTOGRAPH ACL RECONSTRUCTION;  Surgeon: Eugenia Mcalpine, MD;  Location: [facility] [place];  Service: Orthopedics;  Laterality: Right;   KNEE ARTHROSCOPY N/A 01/2014   KNEE REPAIR EXTENSOR MECHANISM  2015   RETINAL DETACHMENT REPAIR W/ SCLERAL BUCKLE LE Left 08-27-2006   SVT ABLATION  1995 (approx.  age 64's)     Current Outpatient Medications on File Prior to Visit  Medication Sig Dispense Refill   albuterol (VENTOLIN HFA) 108 (90 Base) MCG/ACT inhaler Inhale 1 puff into the lungs every 6 (six) hours as needed for wheezing or shortness of breath.     carvedilol (COREG) 25 MG tablet Take 1.5 tablets (37.5 mg total) by mouth 2 (two) times daily.  270 tablet 3   chlorthalidone (HYGROTON) 25 MG tablet TAKE 1 TABLET BY MOUTH EVERY DAY IN THE MORNING 90 tablet 3   ibuprofen (ADVIL) 800 MG tablet 1 tablet with food or milk     nitroGLYCERIN (NITROSTAT) 0.4 MG SL tablet Place 1 tablet (0.4 mg total) under the tongue every 5 (five) minutes as needed for chest pain (CP or SOB). 25 tablet 0   omeprazole (PRILOSEC) 20 MG capsule TAKE 1 CAPSULE (20 MG TOTAL) BY MOUTH DAILY BEFORE BREAKFAST. 90 capsule 1   sacubitril-valsartan (ENTRESTO) 97-103 MG Take 1 tablet by mouth 2 (two) times daily. 180 tablet 3   spironolactone (ALDACTONE) 50 MG tablet Take 1 tablet (50 mg total) by mouth daily. 90 tablet 3   No current facility-administered medications on file prior to visit.    Physical exam:  Today's Vitals   09/01/20 1110  BP: (!) 148/105  Pulse: (!) 103  Weight: 235 lb (106.6 kg)  Height: 5\' 5"  (1.651 m)   Body mass index is 39.11 kg/m.   Wt Readings from Last 3 Encounters:  09/01/20 235 lb (106.6 kg)  08/21/20 245 lb (111.1 kg)  02/16/20 236 lb (107 kg)     Ht Readings from Last 3 Encounters:  09/01/20 5\' 5"  (1.651 m)  08/21/20 5\' 4"  (1.626 m)  02/16/20 5\' 4"  (1.626 m)      General: The patient is awake, alert and appears not in acute distress. The patient is well groomed. Head: Normocephalic, atraumatic.  Neck is supple. Mallampati 1-  Sharp and short . neck circumference:15 inches . Nasal airflow  patent.  Retrognathia is  seen.  Dental status:  Biological  Cardiovascular:  Regular rate and cardiac rhythm by pulse,  without distended neck veins. Respiratory: Lungs are clear to auscultation.  Skin:  Without evidence of ankle edema, or rash. Trunk: The patient's posture is erect.   Neurologic exam : The patient is awake and alert, oriented to place and time.  Memory subjective described as intact.  Attention span & concentration ability appears normal.  Speech is fluent,  without  dysarthria, dysphonia or aphasia.   Mood and affect are appropriate.   Cranial nerves: no loss of smell or taste reported  Pupils are equal and briskly reactive to light. Funduscopic exam deferred. .  Extraocular movements in vertical and horizontal planes were intact and without nystagmus. No Diplopia. Visual fields by finger perimetry are intact. Hearing was intact to soft voice and finger rubbing.    Facial sensation intact to fine touch.  Facial motor strength is symmetric and tongue and uvula move midline.  Neck ROM : rotation, tilt and flexion extension were normal for age and shoulder shrug was symmetrical.    Motor exam:  Symmetric bulk, tone and ROM.   Normal tone without cog- wheeling, symmetric grip strength .   Sensory:  Fine touch, pinprick and vibration were tested  and  normal.  Proprioception tested in the upper extremities was normal.   Coordination: Rapid alternating movements in the fingers/hands were of normal speed.  The Finger-to-nose maneuver was intact without evidence of ataxia, dysmetria or tremor.   Gait and station: Patient could rise unassisted from a seated position, walked without assistive device.  Stance is of normal width/ base and the patient turned with 3 steps.  Toe and heel walk were deferred.  Deep tendon reflexes: in the  upper and lower extremities are symmetric and intact.  Babinski response was deferred.       After spending a total time of  45 minutes face to face and additional time for physical and neurologic examination, review of laboratory studies,  personal review of imaging studies, reports and results of other testing and review of referral information / records as far as provided in visit, I have established the following assessments:  1) Interesting history of supraventricular tachycardia due to WPW or similar disorder,  2) CHF history  EF was 30% in 2020, she went to hospital feeling she may have COVID and was never positive, she is vaccinated against COVID now,  awaits booster this week.  3) obesity - BMI 39.  4)Retrognathia.  5) declining sleep habits , through the pandemic less sleep routines and loss of bedtime, rise time, sleeping with Tv on, etc.    My Plan is to proceed with:  1) screening for Sleep apnea. This patient is at high risk of CSA- due to low EF, I would much prefer an attended sleep study over a HST to see cardiac rhythm , oxygen saturation in relation to sleep stages, REM and NREM etc.      I would like to thank Yates Decamp, MD and for allowing me to meet with and to take care of this pleasant patient.   In short, Sydney Jackson is presenting with non restorative sleep and many cardiac risk factors for central apnea, atrial b fib, CHF -  All symptom that can be attributed to sleep apnea, hypoxia. I plan to follow up either personally or through our NP within 2-4 month.   CC: I will share my notes with PCP.  Electronically signed by: Melvyn Novas, MD 09/01/2020 11:37 AM  [facility] Board certified by The [employer] of Sleep Medicine and Diplomate of the [employer] of Sleep Medicine. Board certified In Neurology through the ABPN, Fellow of the [employer] of Neurology. Medical Director of [facility].

## 2020-09-01 NOTE — Patient Instructions (Signed)
Insomnia Insomnia is a sleep disorder that makes it difficult to fall asleep or stay asleep. Insomnia can cause fatigue, low energy, difficulty concentrating, mood swings, and poor performance at work or school. There are three different ways to classify insomnia:  Difficulty falling asleep.  Difficulty staying asleep.  Waking up too early in the morning. Any type of insomnia can be long-term (chronic) or short-term (acute). Both are common. Short-term insomnia usually lasts for three months or less. Chronic insomnia occurs at least three times a week for longer than three months. What are the causes? Insomnia may be caused by another condition, situation, or substance, such as:  Anxiety.  Certain medicines.  Gastroesophageal reflux disease (GERD) or other gastrointestinal conditions.  Asthma or other breathing conditions.  Restless legs syndrome, sleep apnea, or other sleep disorders.  Chronic pain.  Menopause.  Stroke.  Abuse of alcohol, tobacco, or illegal drugs.  Mental health conditions, such as depression.  Caffeine.  Neurological disorders, such as Alzheimer's disease.  An overactive thyroid (hyperthyroidism). Sometimes, the cause of insomnia may not be known. What increases the risk? Risk factors for insomnia include:  Gender. Women are affected more often than men.  Age. Insomnia is more common as you get older.  Stress.  Lack of exercise.  Irregular work schedule or working night shifts.  Traveling between different time zones.  Certain medical and mental health conditions. What are the signs or symptoms? If you have insomnia, the main symptom is having trouble falling asleep or having trouble staying asleep. This may lead to other symptoms, such as:  Feeling fatigued or having low energy.  Feeling nervous about going to sleep.  Not feeling rested in the morning.  Having trouble concentrating.  Feeling irritable, anxious, or depressed. How  is this diagnosed? This condition may be diagnosed based on:  Your symptoms and medical history. Your health care provider may ask about: ? Your sleep habits. ? Any medical conditions you have. ? Your mental health.  A physical exam. How is this treated? Treatment for insomnia depends on the cause. Treatment may focus on treating an underlying condition that is causing insomnia. Treatment may also include:  Medicines to help you sleep.  Counseling or therapy.  Lifestyle adjustments to help you sleep better. Follow these instructions at home: Eating and drinking  Limit or avoid alcohol, caffeinated beverages, and cigarettes, especially close to bedtime. These can disrupt your sleep.  Do not eat a large meal or eat spicy foods right before bedtime. This can lead to digestive discomfort that can make it hard for you to sleep.   Sleep habits  Keep a sleep diary to help you and your health care provider figure out what could be causing your insomnia. Write down: ? When you sleep. ? When you wake up during the night. ? How well you sleep. ? How rested you feel the next day. ? Any side effects of medicines you are taking. ? What you eat and drink.  Make your bedroom a dark, comfortable place where it is easy to fall asleep. ? Put up shades or blackout curtains to block light from outside. ? Use a white noise machine to block noise. ? Keep the temperature cool.  Limit screen use before bedtime. This includes: ? Watching TV. ? Using your smartphone, tablet, or computer.  Stick to a routine that includes going to bed and waking up at the same times every day and night. This can help you fall asleep faster. Consider   making a quiet activity, such as reading, part of your nighttime routine.  Try to avoid taking naps during the day so that you sleep better at night.  Get out of bed if you are still awake after 15 minutes of trying to sleep. Keep the lights down, but try reading or  doing a quiet activity. When you feel sleepy, go back to bed.   General instructions  Take over-the-counter and prescription medicines only as told by your health care provider.  Exercise regularly, as told by your health care provider. Avoid exercise starting several hours before bedtime.  Use relaxation techniques to manage stress. Ask your health care provider to suggest some techniques that may work well for you. These may include: ? Breathing exercises. ? Routines to release muscle tension. ? Visualizing peaceful scenes.  Make sure that you drive carefully. Avoid driving if you feel very sleepy.  Keep all follow-up visits as told by your health care provider. This is important. Contact a health care provider if:  You are tired throughout the day.  You have trouble in your daily routine due to sleepiness.  You continue to have sleep problems, or your sleep problems get worse. Get help right away if:  You have serious thoughts about hurting yourself or someone else. If you ever feel like you may hurt yourself or others, or have thoughts about taking your own life, get help right away. You can go to your nearest emergency department or call:  Your local emergency services (911 in the [place]  A suicide crisis helpline, such as the National Suicide Prevention Lifeline at 1-800-273-8255. This is open 24 hours a day. Summary  Insomnia is a sleep disorder that makes it difficult to fall asleep or stay asleep.  Insomnia can be long-term (chronic) or short-term (acute).  Treatment for insomnia depends on the cause. Treatment may focus on treating an underlying condition that is causing insomnia.  Keep a sleep diary to help you and your health care provider figure out what could be causing your insomnia. This information is not intended to replace advice given to you by your health care provider. Make sure you discuss any questions you have with your health care provider. Document  Revised: 06/08/2020 Document Reviewed: 06/08/2020 Elsevier Patient Education  2021 Elsevier Inc. Quality Sleep Information, Adult Quality sleep is important for your mental and physical health. It also improves your quality of life. Quality sleep means you:  Are asleep for most of the time you are in bed.  Fall asleep within 30 minutes.  Wake up no more than once a night.  Are awake for no longer than 20 minutes if you do wake up during the night. Most adults need 7-8 hours of quality sleep each night. How can poor sleep affect me? If you do not get enough quality sleep, you may have:  Mood swings.  Daytime sleepiness.  Confusion.  Decreased reaction time.  Sleep disorders, such as insomnia and sleep apnea.  Difficulty with: ? Solving problems. ? Coping with stress. ? Paying attention. These issues may affect your performance and productivity at work, school, and at home. Lack of sleep may also put you at higher risk for accidents, suicide, and risky behaviors. If you do not get quality sleep you may also be at higher risk for several health problems, including:  Infections.  Type 2 diabetes.  Heart disease.  High blood pressure.  Obesity.  Worsening of long-term conditions, like arthritis, kidney disease, depression, Parkinson's disease,   and epilepsy. What actions can I take to get more quality sleep?  Stick to a sleep schedule. Go to sleep and wake up at about the same time each day. Do not try to sleep less on weekdays and make up for lost sleep on weekends. This does not work.  Try to get about 30 minutes of exercise on most days. Do not exercise 2-3 hours before going to bed.  Limit naps during the day to 30 minutes or less.  Do not use any products that contain nicotine or tobacco, such as cigarettes or e-cigarettes. If you need help quitting, ask your health care provider.  Do not drink caffeinated beverages for at least 8 hours before going to bed.  Coffee, tea, and some sodas contain caffeine.  Do not drink alcohol close to bedtime.  Do not eat large meals close to bedtime.  Do not take naps in the late afternoon.  Try to get at least 30 minutes of sunlight every day. Morning sunlight is best.  Make time to relax before bed. Reading, listening to music, or taking a hot bath promotes quality sleep.  Make your bedroom a place that promotes quality sleep. Keep your bedroom dark, quiet, and at a comfortable room temperature. Make sure your bed is comfortable. Take out sleep distractions like TV, a computer, smartphone, and bright lights.  If you are lying awake in bed for longer than 20 minutes, get up and do a relaxing activity until you feel sleepy.  Work with your health care provider to treat medical conditions that may affect sleeping, such as: ? Nasal obstruction. ? Snoring. ? Sleep apnea and other sleep disorders.  Talk to your health care provider if you think any of your prescription medicines may cause you to have difficulty falling or staying asleep.  If you have sleep problems, talk with a sleep consultant. If you think you have a sleep disorder, talk with your health care provider about getting evaluated by a specialist.      Where to find more information  National Sleep Foundation website: https://sleepfoundation.org  National Heart, Lung, and Blood Institute (NHLBI): www.nhlbi.nih.gov/files/docs/public/sleep/healthy_sleep.pdf  Centers for Disease Control and Prevention (CDC): www.cdc.gov/sleep/index.html Contact a health care provider if you:  Have trouble getting to sleep or staying asleep.  Often wake up very early in the morning and cannot get back to sleep.  Have daytime sleepiness.  Have daytime sleep attacks of suddenly falling asleep and sudden muscle weakness (narcolepsy).  Have a tingling sensation in your legs with a strong urge to move your legs (restless legs syndrome).  Stop breathing  briefly during sleep (sleep apnea).  Think you have a sleep disorder or are taking a medicine that is affecting your quality of sleep. Summary  Most adults need 7-8 hours of quality sleep each night.  Getting enough quality sleep is an important part of health and well-being.  Make your bedroom a place that promotes quality sleep and avoid things that may cause you to have poor sleep, such as alcohol, caffeine, smoking, and large meals.  Talk to your health care provider if you have trouble falling asleep or staying asleep. This information is not intended to replace advice given to you by your health care provider. Make sure you discuss any questions you have with your health care provider. Document Revised: 11/05/2017 Document Reviewed: 11/05/2017 Elsevier Patient Education  2021 Elsevier Inc.  

## 2020-09-28 ENCOUNTER — Telehealth: Payer: Self-pay

## 2020-09-28 ENCOUNTER — Other Ambulatory Visit: Payer: Self-pay

## 2020-09-28 ENCOUNTER — Ambulatory Visit (HOSPITAL_COMMUNITY)
Admission: EM | Admit: 2020-09-28 | Discharge: 2020-09-28 | Disposition: A | Discharge status: Home / Self Care | Payer: 59 | Attending: Student | Admitting: Student

## 2020-09-28 DIAGNOSIS — R0789 Other chest pain: Secondary | ICD-10-CM | POA: Diagnosis not present

## 2020-09-28 DIAGNOSIS — I10 Essential (primary) hypertension: Secondary | ICD-10-CM

## 2020-09-28 MED ORDER — ACETAMINOPHEN 325 MG PO TABS
650.0000 mg | ORAL_TABLET | Freq: Once | ORAL | Status: AC
  Administered 2020-09-28: 650 mg via ORAL

## 2020-09-28 MED ORDER — ACETAMINOPHEN 325 MG PO TABS
ORAL_TABLET | ORAL | Status: AC
  Filled 2020-09-28: qty 2

## 2020-09-28 NOTE — ED Provider Notes (Addendum)
[facility]    CSN: [id number] Arrival date & time: 09/28/20  1933      History   Chief Complaint Chief Complaint  Patient presents with  . Chest Pain  . Shortness of Breath    HPI Sydney Jackson is a 50 y.o. female presenting with sudden onset of left/sternal chest pain radiating to left scapula. Significant cardiac history including calf, SVT, hypertension; followed by cardiology for this. Endorses shortness of breath, worse with lying flat. States chest pain is worse with deep inspiration and movement, but it is also present at rest. Also mentions left-sided abd pain. Denies trauma. Denies changes in bowel or bladder function. States she took her antihypertensives today.  HPI  Past Medical History:  Diagnosis Date  . Chronic systolic (congestive) heart failure (HCC) 10/14/2018   Echocardiogram 03/19/2019: Low normal LV systolic function with visual EF 50-55%.  . Depression   . Herpes simplex disease   . History of colonoscopy 7/6/32021  . History of syncope    s/p  heart valve repair  1995  . Hypertension   . NSVT (nonsustained ventricular tachycardia) (HCC)   . Palpitations   . Right ACL tear   . Right knee meniscal tear     Patient Active Problem List   Diagnosis Date Noted  . Snoring 09/01/2020  . Insomnia disorder, with other sleep disorder, recurrent 09/01/2020  . Non-restorative sleep 09/01/2020  . Chronic systolic congestive heart failure (HCC) 09/01/2020  . Cardiomyopathy (HCC) 09/01/2020  . History of Wolff-Parkinson-White (WPW) syndrome 09/01/2020  . NSVT (nonsustained ventricular tachycardia) (HCC) 11/05/2018  . Influenza B 11/05/2018  . S/P ACL reconstruction 01/21/2014  . Essential hypertension 07/14/2012  . Generalized anxiety disorder 07/14/2012  . Depression 07/14/2012  . Retinal detachment 07/14/2012    Past Surgical History:  Procedure Laterality Date  . ABLATION     SVT at age 7 years  . KNEE ARTHROSCOPY Right 01/21/2014    Procedure: RIGHT  KNEE ARTHROSCOPY WITH DEBRIDEMENT PARTIAL MENISECTOMY AND AUTOGRAPH ACL RECONSTRUCTION;  Surgeon: Eugenia Mcalpine, MD;  Location: [facility];  Service: Orthopedics;  Laterality: Right;  . KNEE ARTHROSCOPY N/A 01/2014  . KNEE REPAIR EXTENSOR MECHANISM  2015  . RETINAL DETACHMENT REPAIR W/ SCLERAL BUCKLE LE Left 08-27-2006  . SVT ABLATION  1995 (approx.  age 60's)    OB History   No obstetric history on file.      Home Medications    Prior to Admission medications   Medication Sig Start Date End Date Taking? Authorizing Provider  albuterol (VENTOLIN HFA) 108 (90 Base) MCG/ACT inhaler Inhale 1 puff into the lungs every 6 (six) hours as needed for wheezing or shortness of breath.    [provider]  carvedilol (COREG) 25 MG tablet Take 1.5 tablets (37.5 mg total) by mouth 2 (two) times daily. 08/21/20 08/16/21  Cantwell, Celeste C, PA-C  chlorthalidone (HYGROTON) 25 MG tablet TAKE 1 TABLET BY MOUTH EVERY DAY IN THE MORNING 08/21/20   Cantwell, Celeste C, PA-C  ibuprofen (ADVIL) 800 MG tablet 1 tablet with food or milk 06/29/19   [provider]  nitroGLYCERIN (NITROSTAT) 0.4 MG SL tablet Place 1 tablet (0.4 mg total) under the tongue every 5 (five) minutes as needed for chest pain (CP or SOB). 02/16/20   Yates Decamp, MD  omeprazole (PRILOSEC) 20 MG capsule TAKE 1 CAPSULE (20 MG TOTAL) BY MOUTH DAILY BEFORE BREAKFAST. 05/15/20   Yates Decamp, MD  sacubitril-valsartan (ENTRESTO) 97-103 MG Take 1  tablet by mouth 2 (two) times daily. 08/21/20   Cantwell, Celeste C, PA-C  spironolactone (ALDACTONE) 50 MG tablet Take 1 tablet (50 mg total) by mouth daily. 08/21/20   Cantwell, Renne Musca, PA-C    Family History Family History  Problem Relation Age of Onset  . Hypertension Mother   . Diabetes Father   . Hypertension Brother   . Congestive Heart Failure Sister     Social History Social History   Tobacco Use  . Smoking status: Former Smoker    Types:  Cigarettes    Quit date: 2010    Years since quitting: 12.1  . Smokeless tobacco: Never Used  . Tobacco comment: social smoker  Vaping Use  . Vaping Use: Never used  Substance Use Topics  . Alcohol use: Yes    Comment: Glass or two of wine on occasion, past history of heavy use   . Drug use: No    Comment: hx   THC use, ended 10+ years ago-- (01-17-2014   DENIES     Allergies   Patient has no known allergies.   Review of Systems Review of Systems  Cardiovascular: Positive for chest pain.  Gastrointestinal: Positive for abdominal pain.     Physical Exam Triage Vital Signs ED Triage Vitals  Enc Vitals Group     BP 09/28/20 1945 (!) 173/115     Pulse Rate 09/28/20 1945 96     Resp 09/28/20 1945 (!) 28     Temp 09/28/20 1945 98.7 F (37.1 C)     Temp Source 09/28/20 1945 Oral     SpO2 09/28/20 1945 100 %     Weight --      Height --      Head Circumference --      Peak Flow --      Pain Score 09/28/20 1955 10     Pain Loc --      Pain Edu? --      Excl. in GC? --    No data found.  Updated Vital Signs BP (!) 173/115 (BP Location: Left Arm)   Pulse 96   Temp 98.7 F (37.1 C) (Oral)   Resp (!) 28   SpO2 100%   Visual Acuity Right Eye Distance:   Left Eye Distance:   Bilateral Distance:    Right Eye Near:   Left Eye Near:    Bilateral Near:     Physical Exam Vitals reviewed.  Constitutional:      Appearance: She is well-developed.  Cardiovascular:     Rate and Rhythm: Normal rate and regular rhythm.     Pulses:          Radial pulses are 2+ on the right side and 2+ on the left side.     Heart sounds: Normal heart sounds.  Pulmonary:     Effort: Pulmonary effort is normal.     Breath sounds: Normal breath sounds and air entry. No decreased breath sounds, wheezing, rhonchi or rales.  Chest:     Comments: Significant tenderness to palpation of left lateral and anterior ribs. No bony deformity or ecchymosis. ROM left arm intact but with pain.   Abdominal:     Tenderness: There is abdominal tenderness in the left upper quadrant and left lower quadrant. There is guarding. There is no rebound.  Musculoskeletal:     Right lower leg: No edema.     Left lower leg: No edema.  Neurological:     Mental Status: She  is alert.     Cranial Nerves: Cranial nerves are intact.     Sensory: Sensation is intact.     Motor: Motor function is intact.     Coordination: Romberg sign negative.     Comments: CN 2-12 grossly intact      UC Treatments / Results  Labs (all labs ordered are listed, but only abnormal results are displayed) Labs Reviewed - No data to display  EKG   Radiology No results found.  Procedures Procedures (including critical care time)  Medications Ordered in UC Medications - No data to display  Initial Impression / Assessment and Plan / UC Course  I have reviewed the triage vital signs and the nursing notes.  Pertinent labs & imaging results that were available during my care of the patient were reviewed by me and considered in my medical decision making (see chart for details).       This patient is a 50 year old female with history of hypertension, CHF, presenting with atypical left-sided chest pain and shortness of breath. Chest pain is reproducible, but pain is out of proportion to exam. No recent trauma.   EKG NSR but with nonspecific changes, different than EKG 1 month ago.   Essential hypertension- states she is taking BP medications as directed.  I am sending this patient to [facility] ED for further evaluation. She is currently hemodynamically stable for transport in personal vehicle by daughter.   Spent over 40 minutes obtaining H&P, performing physical, interpreting EKG, discussing results, treatment plan and plan for follow-up with patient. Patient agrees with plan.   This chart was dictated using voice recognition software, Dragon. Despite the best efforts of this provider to proofread and  correct errors, errors may still occur which can change documentation meaning.   Final Clinical Impressions(s) / UC Diagnoses   Final diagnoses:  Atypical chest pain  Essential hypertension     Discharge Instructions     Please head straight to [facility] ED for further evaluation of chest pain. Make sure that your daughter drives the vehicle.   I you develop worsening of chest pain, abdominal pain, dizziness, weakness, headaches, etc. while on the way to the ED- stop and immediately call EMS.     ED Prescriptions    None     PDMP not reviewed this encounter.   Rhys Martini, PA-C 09/28/20 2036    Rhys Martini, PA-C 10/02/20 [phone]

## 2020-09-28 NOTE — ED Triage Notes (Signed)
Pt c/o acute left rib and left/mid sternal pain radiating to left scapular region onset last night while at rest with SOB at rest and on exertion. Pt states difficulty with finding position of comfort 2/2 rib pain and SOB.   Pt reproducible with palpation and increases with inspiration.  Pt took Gas-X w/o relief.   Denies n/v, diaphoresis, dizziness. EKG performed and given to L. Cheree Ditto, [place].

## 2020-09-28 NOTE — Telephone Encounter (Signed)
Patient stated that she is having some chest pain and some shortness of breath that started yesterday. Patient thought that it was her acid reflux and went and purchased Gas-X. She stated that the pain is worsening. Per Dr. Jacinto Halim, patient is to take Tylenol today, if pain continues, she is to come in tomorrow to be evaluated and have an EKG done. She will come in, if she doesn't go to the ED tonight, if the pain doesn't get better.

## 2020-09-28 NOTE — ED Notes (Signed)
Patient is being discharged from the Urgent Care and sent to the Emergency Department via POV. Per L. Cheree Ditto, PA patient is in need of higher level of care due to CP, SOB. Patient is aware and verbalizes understanding of plan of care.  Vitals:   09/28/20 1945  BP: (!) 173/115  Pulse: 96  Resp: (!) 28  Temp: 98.7 F (37.1 C)  SpO2: 100%

## 2020-09-28 NOTE — Discharge Instructions (Addendum)
Please head straight to [facility] ED for further evaluation of chest pain. Make sure that your daughter drives the vehicle.   I you develop worsening of chest pain, abdominal pain, dizziness, weakness, headaches, etc. while on the way to the ED- stop and immediately call EMS.

## 2020-09-29 ENCOUNTER — Encounter: Payer: Self-pay | Admitting: Student

## 2020-09-29 ENCOUNTER — Ambulatory Visit: Payer: 59 | Admitting: Student

## 2020-09-29 VITALS — BP 152/88 | HR 88 | Temp 97.7°F | Ht 65.0 in | Wt 240.0 lb

## 2020-09-29 DIAGNOSIS — I5022 Chronic systolic (congestive) heart failure: Secondary | ICD-10-CM

## 2020-09-29 DIAGNOSIS — R072 Precordial pain: Secondary | ICD-10-CM

## 2020-09-29 DIAGNOSIS — I10 Essential (primary) hypertension: Secondary | ICD-10-CM

## 2020-09-29 DIAGNOSIS — R0789 Other chest pain: Secondary | ICD-10-CM

## 2020-09-29 NOTE — Progress Notes (Signed)
Primary Physician/Referring:  Laurann Montana, MD  Patient ID: Sydney Jackson, female    DOB: 19-Sep-1970, 50 y.o.   MRN: [id number]  Chief Complaint  Patient presents with  . Hypertension  . Chest Pain   HPI:    Sydney Jackson  is a 50 y.o. [facility] patient with hypertension, history of SVT status post ablation at age 46-25 years in [place], [place], new diagnosis of cardiomyopathy diagnosed in Jan 2020 with EF 25-30%, grade 2 diastolic dysfunction, grade 2 mitral regurgitation, nonsustained VT seen on event monitor with normal perfusion with low EF by nuclear stress in March 2020. She is on Entresto to maximum dose along with carvedilol to maximum dose.   Patient presents for follow up of hypertension with complaints of chest pain starting yesterday.  She reports chest pain started 2 days ago and is worse when bending over or taking deep breaths.  She also notes movement of her left arm significantly exacerbates the pain she describes as sharp.  She is tender across the left anterior chest wall along the left ribs and in the subscapular region.  Pain is also worse when lying on her left or right side.  She was evaluated in urgent care last night who gave her Tylenol which improved the pain.  2 days ago when pain first started she tried taking nitroglycerin which did not improve symptoms.  She unfortunately does not have any recent home blood pressure readings.  Patient has omeprazole, however she only takes it as needed.  Past Medical History:  Diagnosis Date  . Chronic systolic (congestive) heart failure (HCC) 10/14/2018   Echocardiogram 03/19/2019: Low normal LV systolic function with visual EF 50-55%.  . Depression   . Herpes simplex disease   . History of colonoscopy 7/6/32021  . History of syncope    s/p  heart valve repair  1995  . Hypertension   . NSVT (nonsustained ventricular tachycardia) (HCC)   . Palpitations   . Right ACL tear   . Right knee meniscal tear    Past Surgical  History:  Procedure Laterality Date  . ABLATION     SVT at age 41 years  . KNEE ARTHROSCOPY Right 01/21/2014   Procedure: RIGHT  KNEE ARTHROSCOPY WITH DEBRIDEMENT PARTIAL MENISECTOMY AND AUTOGRAPH ACL RECONSTRUCTION;  Surgeon: Eugenia Mcalpine, MD;  Location: [facility] [place];  Service: Orthopedics;  Laterality: Right;  . KNEE ARTHROSCOPY N/A 01/2014  . KNEE REPAIR EXTENSOR MECHANISM  2015  . RETINAL DETACHMENT REPAIR W/ SCLERAL BUCKLE LE Left 08-27-2006  . SVT ABLATION  1995 (approx.  age 89's)   Family History  Problem Relation Age of Onset  . Hypertension Mother   . Diabetes Father   . Hypertension Brother   . Congestive Heart Failure Sister     Social History   Tobacco Use  . Smoking status: Former Smoker    Types: Cigarettes    Quit date: 2010    Years since quitting: 12.1  . Smokeless tobacco: Never Used  . Tobacco comment: social smoker  Substance Use Topics  . Alcohol use: Yes    Comment: Glass or two of wine on occasion, past history of heavy use    Marital Status: Single   ROS  Review of Systems  Constitutional: Negative for weight gain.  Cardiovascular: Positive for chest pain and leg swelling. Negative for claudication, dyspnea on exertion, near-syncope, orthopnea, palpitations, paroxysmal nocturnal dyspnea and syncope.  Hematologic/Lymphatic: Does not bruise/bleed easily.  Musculoskeletal: Negative for joint  swelling.  Gastrointestinal: Negative for melena.  Neurological: Negative for dizziness and weakness.   Objective  Blood pressure (!) 152/88, pulse 88, temperature 97.7 F (36.5 C), height 5\' 5"  (1.651 m), weight 240 lb (108.9 kg), SpO2 96 %.  Vitals with BMI 09/29/2020 09/28/2020 09/01/2020  Height 5\' 5"  - 5\' 5"   Weight 240 lbs - 235 lbs  BMI 39.94 - 39.11  Systolic 152 173 09/03/2020  Diastolic 88 115 105  Pulse 88 96 103     Physical Exam Vitals reviewed.  Constitutional:      Appearance: She is well-developed. She is obese.     Comments:  Morbidly obese   HENT:     Head: Normocephalic and atraumatic.  Cardiovascular:     Rate and Rhythm: Normal rate and regular rhythm.     Pulses: Intact distal pulses.     Heart sounds: S1 normal and S2 normal. No murmur heard. No gallop.      Comments: No leg edema. No JVD.   Pulmonary:     Effort: Pulmonary effort is normal. No accessory muscle usage or respiratory distress.     Breath sounds: Normal breath sounds. No wheezing, rhonchi or rales.  Chest:       Comments: Pain is exacerbated with movement of left arm.  Abdominal:     General: Bowel sounds are normal.     Palpations: Abdomen is soft.  Musculoskeletal:     Right lower leg: Edema (Trace) present.     Left lower leg: Edema (Trace) present.     Comments: Patient is tearful on exam during palpation of left anterior chest wall and along left scapula and subscapular region.   Skin:    General: Skin is warm and dry.     Capillary Refill: Capillary refill takes less than 2 seconds.  Neurological:     General: No focal deficit present.     Mental Status: She is alert and oriented to person, place, and time.    Laboratory examination:   Recent Labs    08/23/20 1247  NA 136  K 3.4*  CL 100  CO2 26  GLUCOSE 108*  BUN 15  CREATININE 1.18*  CALCIUM 8.8*  GFRNONAA 56*   CrCl cannot be calculated (Patient's most recent lab result is older than the maximum 21 days allowed.).  CMP Latest Ref Rng & Units 08/23/2020 02/02/2019 12/12/2018  Glucose 70 - 99 mg/dL 10/21/2020) 98 02/04/2019)  BUN 6 - 20 mg/dL 15 20 6   Creatinine 0.44 - 1.00 mg/dL 02/11/2019) 751(Z 001(V  Sodium 135 - 145 mmol/L 136 138 137  Potassium 3.5 - 5.1 mmol/L 3.4(L) 3.7 3.6  Chloride 98 - 111 mmol/L 100 105 99  CO2 22 - 32 mmol/L 26 22 25   Calcium 8.9 - 10.3 mg/dL ) 4.94(W) 9.2  Total Protein 6.5 - 8.1 g/dL - - 7.3  Total Bilirubin 0.3 - 1.2 mg/dL - - 0.5  Alkaline Phos 38 - 126 U/L - - 62  AST 15 - 41 U/L - - 14(L)  ALT 0 - 44 U/L - - 12   CBC Latest Ref  Rng & Units 08/23/2020 02/02/2019 12/12/2018  WBC 4.0 - 10.5 K/uL 6.0 6.2 7.2  Hemoglobin 12.0 - 15.0 g/dL 6.3(W 11.7(L) 12.0  Hematocrit 36.0 - 46.0 % 41.0 37.6 38.9  Platelets 150 - 400 K/uL 439(H) 384 450(H)   Lipid Panel  No results found for: CHOL, TRIG, HDL, CHOLHDL, VLDL, LDLCALC, LDLDIRECT HEMOGLOBIN A1C No results found for: HGBA1C,  MPG TSH No results for input(s): TSH in the last 8760 hours.  External labs:  08/23/2019: HDL 59, LDL 119, total cholesterol 195, triglycerides 93  05/06/2019: Glucose 88, BUN 16, serum creatinine 0.90 TSH 1.44  10/09/2016: Chol 140, Trig 51, HDL 57, LDL 73.  Medications and allergies  No Known Allergies   Current Outpatient Medications  Medication Instructions  . albuterol (VENTOLIN HFA) 108 (90 Base) MCG/ACT inhaler 1 puff, Inhalation, Every 6 hours PRN  . carvedilol (COREG) 37.5 mg, Oral, 2 times daily  . chlorthalidone (HYGROTON) 25 MG tablet TAKE 1 TABLET BY MOUTH EVERY DAY IN THE MORNING  . ibuprofen (ADVIL) 800 MG tablet 1 tablet with food or milk  . nitroGLYCERIN (NITROSTAT) 0.4 mg, Sublingual, Every 5 min PRN  . omeprazole (PRILOSEC) 20 mg, Oral, Daily before breakfast  . sacubitril-valsartan (ENTRESTO) 97-103 MG 1 tablet, Oral, 2 times daily  . spironolactone (ALDACTONE) 50 mg, Oral, Daily   Radiology:   No results found.  Cardiac Studies:   Event Monitor for 30 days: Critical event report 09/05/2018 at 1115: NSR with 12 beats of VT that was auto-triggered. No reported symptoms- Nuclear stress ordered  Lexiscan Sestamibi stress test 10/19/2018: 1. Lexiscan stress test was performed. Exercise capacity was not assessed. Resting blood pressure was 170/100 mmHg and peak effect blood pressure was 160/90 mmHg. Stress EKG is non diagnostic for ischemia as it is a pharmacologic stress. In addition, the stress electrocardiogram showed sinus tachycardia, normal stress conduction, no stress arrhythmias and nonspecific ST-T changes.   2. The  overall quality of the study is good. There is no evidence of abnormal lung activity. Stress and rest SPECT images demonstrate homogeneous tracer distribution throughout the myocardium. LV cavity is dilated on both rest and stress images. Gated SPECT imaging reveals global decrease in myocardial thickening and wall motion. The left ventricular ejection fraction was reduced at 29%.   3. High risk study due to reduced LVEF. No evidence of ischemia/ infarction seen.  Echocardiogram 12/23/2019:  Left ventricle cavity is normal in size. Moderate concentric hypertrophy  of the left ventricle. Normal global wall motion. Normal LV systolic  function with EF 50-55%. Doppler evidence of grade I (impaired) diastolic  dysfunction, normal LAP.  No significant valvular abnormality.  Normal right atrial pressure.  No significant change compared to previous study on 03/19/2019.   EKG   EKG 09/29/2020: Sinus rhythm at a rate of 81 bpm.  Left atrial enlargement.  Normal axis.  Poor progression, cannot exclude anteroseptal infarct old.  Nonspecific T wave abnormality.  No evidence of underlying ischemia or injury pattern.  EKG 09/28/2020: Sinus rhythm at a rate of 92 bpm, left atrial enlargement.  Normal axis.  Incomplete right bundle branch block.  Poor refreshing, cannot exclude anteroseptal infarct old.  EKG 08/21/2020: Sinus rhythm at a rate of 72 bpm, left atrial enlargement.  Normal axis.  Poor progression, cannot exclude anteroseptal infarct old. No evidence of ischemia or injury.  No significant change compared to EKG 12/16/2019.  EKG normal sinus rhythm at the rate of 84 bpm, LAE, normal axis, poor R wave progression, cannot exclude anteroseptal infarct old.  No evidence of ischemia, otherwise normal EKG. No significant change from 06/18/2019   Assessment     ICD-10-CM   1. Precordial pain  R07.2 EKG 12-Lead  2. Musculoskeletal chest pain  R07.89   3. Essential hypertension  I10   4. Chronic systolic  (congestive) heart failure (HCC)  I50.22  No orders of the defined types were placed in this encounter.   There are no discontinued medications.  Recommendations:   Ms. Spadea is a 50 y.o.  AAF patient with hypertension, history of SVT status post ablation at age 28-25 years in [place], [place], new diagnosis of cardiomyopathy diagnosed in Jan 2020 with EF 25-30%, grade 2 diastolic dysfunction, grade 2 mitral regurgitation, nonsustained VT seen on event monitor with normal perfusion with low EF by nuclear stress in March 2020. LVEF improved to 50-55% via echocardiogram 12/2019. She is on Entresto to maximum dose along with carvedilol to maximum dose.   Patient presents for 1 month follow-up of hypertension with complaints of chest pain since yesterday. There are no clinical signs of heart failure. Patient's symptoms and exam are consistent with musculoskeletal etiology of chest pain, particularly as patient is exquisitely tender to palpation along chest wall.  Her EKG remained stable compared to previous without changes suggestive of underlying ischemia or injury, very low suspicion for ACS.  Notably patient's blood pressure is elevated today, did improve upon recheck, although this may be due to patient's pain currently.  Recommend managing chest pain with Tylenol.  Will notify our office if symptoms worsen or fail to improve or become more typical of angina.  Also recommend patient take PPI daily instead of as needed as she may have underlying GERD contributing.  Underlying hypertension may also be contributing to symptoms.  Therefore recommend close follow-up for hypertension.  Follow-up in 1 month, or sooner if needed for hypertension.  She will obtain labs prior to next visit.  Patient to monitor blood pressure on a daily basis and to bring a log with her to her next visit.   Rayford Halsted, PA-C 09/29/2020, 4:58 PM Office: [phone]

## 2020-10-02 ENCOUNTER — Ambulatory Visit: Payer: 59 | Admitting: Student

## 2020-10-24 NOTE — Progress Notes (Deleted)
Primary Physician/Referring:  Laurann Montana, MD  Patient ID: Sydney Jackson, female    DOB: 1971/07/23, 50 y.o.   MRN: [id number]  No chief complaint on file.  HPI:    KSENIA KUNZ  is a 50 y.o. AAF patient with hypertension, history of SVT status post ablation at age 65-24 years in [place], [place], new diagnosis of cardiomyopathy diagnosed in Jan 2020 with EF 25-30%, grade 2 diastolic dysfunction, grade 2 mitral regurgitation, nonsustained VT seen on event monitor with normal perfusion with low EF by nuclear stress in March 2020. She is on Entresto to maximum dose along with carvedilol to maximum dose.   ***Patient presents for 1 month follow-up of hypertension.  At last visit patient was complaining of chest pain consistent with musculoskeletal etiology which seem to be causing her significant discomfort and likely contributing to elevated blood pressure.  ***home BP?   Patient presents for follow up of hypertension with complaints of chest pain starting yesterday.  She reports chest pain started 2 days ago and is worse when bending over or taking deep breaths.  She also notes movement of her left arm significantly exacerbates the pain she describes as sharp.  She is tender across the left anterior chest wall along the left ribs and in the subscapular region.  Pain is also worse when lying on her left or right side.  She was evaluated in urgent care last night who gave her Tylenol which improved the pain.  2 days ago when pain first started she tried taking nitroglycerin which did not improve symptoms.  She unfortunately does not have any recent home blood pressure readings.  Patient has omeprazole, however she only takes it as needed.  Past Medical History:  Diagnosis Date  . Chronic systolic (congestive) heart failure (HCC) 10/14/2018   Echocardiogram 03/19/2019: Low normal LV systolic function with visual EF 50-55%.  . Depression   . Herpes simplex disease   . History of colonoscopy  7/6/32021  . History of syncope    s/p  heart valve repair  1995  . Hypertension   . NSVT (nonsustained ventricular tachycardia) (HCC)   . Palpitations   . Right ACL tear   . Right knee meniscal tear    Past Surgical History:  Procedure Laterality Date  . ABLATION     SVT at age 58 years  . KNEE ARTHROSCOPY Right 01/21/2014   Procedure: RIGHT  KNEE ARTHROSCOPY WITH DEBRIDEMENT PARTIAL MENISECTOMY AND AUTOGRAPH ACL RECONSTRUCTION;  Surgeon: Eugenia Mcalpine, MD;  Location: [facility];  Service: Orthopedics;  Laterality: Right;  . KNEE ARTHROSCOPY N/A 01/2014  . KNEE REPAIR EXTENSOR MECHANISM  2015  . RETINAL DETACHMENT REPAIR W/ SCLERAL BUCKLE LE Left 08-27-2006  . SVT ABLATION  1995 (approx.  age 28's)   Family History  Problem Relation Age of Onset  . Hypertension Mother   . Diabetes Father   . Hypertension Brother   . Congestive Heart Failure Sister     Social History   Tobacco Use  . Smoking status: Former Smoker    Types: Cigarettes    Quit date: 2010    Years since quitting: 12.2  . Smokeless tobacco: Never Used  . Tobacco comment: social smoker  Substance Use Topics  . Alcohol use: Yes    Comment: Glass or two of wine on occasion, past history of heavy use    Marital Status: Single   ROS  Review of Systems  Constitutional: Negative for weight gain.  Cardiovascular: Positive for chest pain and leg swelling. Negative for claudication, dyspnea on exertion, near-syncope, orthopnea, palpitations, paroxysmal nocturnal dyspnea and syncope.  Hematologic/Lymphatic: Does not bruise/bleed easily.  Musculoskeletal: Negative for joint swelling.  Gastrointestinal: Negative for melena.  Neurological: Negative for dizziness and weakness.   Objective  There were no vitals taken for this visit.  Vitals with BMI 09/29/2020 09/28/2020 09/01/2020  Height 5\' 5"  - 5\' 5"   Weight 240 lbs - 235 lbs  BMI 39.94 - 39.11  Systolic 152 173 767  Diastolic 88 115 105  Pulse  88 96 103     Physical Exam Vitals reviewed.  Constitutional:      Appearance: She is well-developed. She is obese.     Comments: Morbidly obese   HENT:     Head: Normocephalic and atraumatic.  Cardiovascular:     Rate and Rhythm: Normal rate and regular rhythm.     Pulses: Intact distal pulses.     Heart sounds: S1 normal and S2 normal. No murmur heard. No gallop.      Comments: No leg edema. No JVD.   Pulmonary:     Effort: Pulmonary effort is normal. No accessory muscle usage or respiratory distress.     Breath sounds: Normal breath sounds. No wheezing, rhonchi or rales.  Chest:       Comments: Pain is exacerbated with movement of left arm.  Abdominal:     General: Bowel sounds are normal.     Palpations: Abdomen is soft.  Musculoskeletal:     Right lower leg: Edema (Trace) present.     Left lower leg: Edema (Trace) present.     Comments: Patient is tearful on exam during palpation of left anterior chest wall and along left scapula and subscapular region.   Skin:    General: Skin is warm and dry.     Capillary Refill: Capillary refill takes less than 2 seconds.  Neurological:     General: No focal deficit present.     Mental Status: She is alert and oriented to person, place, and time.    Laboratory examination:   Recent Labs    08/23/20 1247  NA 136  K 3.4*  CL 100  CO2 26  GLUCOSE 108*  BUN 15  CREATININE 1.18*  CALCIUM 8.8*  GFRNONAA 56*   CrCl cannot be calculated (Patient's most recent lab result is older than the maximum 21 days allowed.).  CMP Latest Ref Rng & Units 08/23/2020 02/02/2019 12/12/2018  Glucose 70 - 99 mg/dL 341(P) 98 379(K)  BUN 6 - 20 mg/dL 15 20 6   Creatinine 0.44 - 1.00 mg/dL 2.40(X) 7.35 3.29  Sodium 135 - 145 mmol/L 136 138 137  Potassium 3.5 - 5.1 mmol/L 3.4(L) 3.7 3.6  Chloride 98 - 111 mmol/L 100 105 99  CO2 22 - 32 mmol/L 26 22 25   Calcium 8.9 - 10.3 mg/dL 9.2(E) 2.6(S) 9.2  Total Protein 6.5 - 8.1 g/dL - - 7.3  Total  Bilirubin 0.3 - 1.2 mg/dL - - 0.5  Alkaline Phos 38 - 126 U/L - - 62  AST 15 - 41 U/L - - 14(L)  ALT 0 - 44 U/L - - 12   CBC Latest Ref Rng & Units 08/23/2020 02/02/2019 12/12/2018  WBC 4.0 - 10.5 K/uL 6.0 6.2 7.2  Hemoglobin 12.0 - 15.0 g/dL 34.1 11.7(L) 12.0  Hematocrit 36.0 - 46.0 % 41.0 37.6 38.9  Platelets 150 - 400 K/uL 439(H) 384 450(H)   Lipid Panel  No  results found for: CHOL, TRIG, HDL, CHOLHDL, VLDL, LDLCALC, LDLDIRECT HEMOGLOBIN A1C No results found for: HGBA1C, MPG TSH No results for input(s): TSH in the last 8760 hours.  External labs:  08/23/2019: HDL 59, LDL 119, total cholesterol 195, triglycerides 93  05/06/2019: Glucose 88, BUN 16, serum creatinine 0.90 TSH 1.44  10/09/2016: Chol 140, Trig 51, HDL 57, LDL 73.  Medications and allergies  No Known Allergies   Current Outpatient Medications  Medication Instructions  . albuterol (VENTOLIN HFA) 108 (90 Base) MCG/ACT inhaler 1 puff, Inhalation, Every 6 hours PRN  . carvedilol (COREG) 37.5 mg, Oral, 2 times daily  . chlorthalidone (HYGROTON) 25 MG tablet TAKE 1 TABLET BY MOUTH EVERY DAY IN THE MORNING  . ibuprofen (ADVIL) 800 MG tablet 1 tablet with food or milk  . nitroGLYCERIN (NITROSTAT) 0.4 mg, Sublingual, Every 5 min PRN  . omeprazole (PRILOSEC) 20 mg, Oral, Daily before breakfast  . sacubitril-valsartan (ENTRESTO) 97-103 MG 1 tablet, Oral, 2 times daily  . spironolactone (ALDACTONE) 50 mg, Oral, Daily   Radiology:   No results found.  Cardiac Studies:   Event Monitor for 30 days: Critical event report 09/05/2018 at 1115: NSR with 12 beats of VT that was auto-triggered. No reported symptoms- Nuclear stress ordered  Lexiscan Sestamibi stress test 10/19/2018: 1. Lexiscan stress test was performed. Exercise capacity was not assessed. Resting blood pressure was 170/100 mmHg and peak effect blood pressure was 160/90 mmHg. Stress EKG is non diagnostic for ischemia as it is a pharmacologic stress. In addition,  the stress electrocardiogram showed sinus tachycardia, normal stress conduction, no stress arrhythmias and nonspecific ST-T changes.   2. The overall quality of the study is good. There is no evidence of abnormal lung activity. Stress and rest SPECT images demonstrate homogeneous tracer distribution throughout the myocardium. LV cavity is dilated on both rest and stress images. Gated SPECT imaging reveals global decrease in myocardial thickening and wall motion. The left ventricular ejection fraction was reduced at 29%.   3. High risk study due to reduced LVEF. No evidence of ischemia/ infarction seen.  Echocardiogram 12/23/2019:  Left ventricle cavity is normal in size. Moderate concentric hypertrophy  of the left ventricle. Normal global wall motion. Normal LV systolic  function with EF 50-55%. Doppler evidence of grade I (impaired) diastolic  dysfunction, normal LAP.  No significant valvular abnormality.  Normal right atrial pressure.  No significant change compared to previous study on 03/19/2019.   EKG   EKG 09/29/2020: Sinus rhythm at a rate of 81 bpm.  Left atrial enlargement.  Normal axis.  Poor progression, cannot exclude anteroseptal infarct old.  Nonspecific T wave abnormality.  No evidence of underlying ischemia or injury pattern.  EKG 09/28/2020: Sinus rhythm at a rate of 92 bpm, left atrial enlargement.  Normal axis.  Incomplete right bundle branch block.  Poor refreshing, cannot exclude anteroseptal infarct old.  EKG 08/21/2020: Sinus rhythm at a rate of 72 bpm, left atrial enlargement.  Normal axis.  Poor progression, cannot exclude anteroseptal infarct old. No evidence of ischemia or injury.  No significant change compared to EKG 12/16/2019.  EKG normal sinus rhythm at the rate of 84 bpm, LAE, normal axis, poor R wave progression, cannot exclude anteroseptal infarct old.  No evidence of ischemia, otherwise normal EKG. No significant change from 06/18/2019   Assessment     ICD-10-CM    1. Essential hypertension  I10   2. Musculoskeletal chest pain  R07.89     No orders  of the defined types were placed in this encounter.   There are no discontinued medications.  Recommendations:   Ms. Senseney is a 50 y.o.  AAF patient with hypertension, history of SVT status post ablation at age 103-24 years in [place], [place], new diagnosis of cardiomyopathy diagnosed in Jan 2020 with EF 25-30%, grade 2 diastolic dysfunction, grade 2 mitral regurgitation, nonsustained VT seen on event monitor with normal perfusion with low EF by nuclear stress in March 2020. LVEF improved to 50-55% via echocardiogram 12/2019. She is on Entresto to maximum dose along with carvedilol to maximum dose.   ***Patient presents for 1 month follow up of hypertension.   ***  ***  Patient presents for 1 month follow-up of hypertension with complaints of chest pain since yesterday. There are no clinical signs of heart failure. Patient's symptoms and exam are consistent with musculoskeletal etiology of chest pain, particularly as patient is exquisitely tender to palpation along chest wall.  Her EKG remained stable compared to previous without changes suggestive of underlying ischemia or injury, very low suspicion for ACS.  Notably patient's blood pressure is elevated today, did improve upon recheck, although this may be due to patient's pain currently.  Recommend managing chest pain with Tylenol.  Will notify our office if symptoms worsen or fail to improve or become more typical of angina.  Also recommend patient take PPI daily instead of as needed as she may have underlying GERD contributing.  Underlying hypertension may also be contributing to symptoms.  Therefore recommend close follow-up for hypertension.  Follow-up in 1 month, or sooner if needed for hypertension.  She will obtain labs prior to next visit.  Patient to monitor blood pressure on a daily basis and to bring a log with her to her next visit.   Rayford Halsted, PA-C 10/24/2020, 2:33 PM Office: [phone]

## 2020-10-25 ENCOUNTER — Ambulatory Visit: Payer: 59 | Admitting: Student

## 2020-10-25 DIAGNOSIS — I10 Essential (primary) hypertension: Secondary | ICD-10-CM

## 2020-10-25 DIAGNOSIS — R0789 Other chest pain: Secondary | ICD-10-CM

## 2020-10-25 NOTE — Progress Notes (Signed)
Primary Physician/Referring:  Laurann Montana, MD  Patient ID: Sydney Jackson, female    DOB: 1970-11-03, 50 y.o.   MRN: [id number]  Chief Complaint  Patient presents with  . Chest Pain  . Follow-up   HPI:    Sydney Jackson  is a 50 y.o. AAF patient with hypertension, history of SVT status post ablation at age 50-25 years in [place], [place], new diagnosis of cardiomyopathy diagnosed in Jan 2020 with EF 25-30%, grade 2 diastolic dysfunction, grade 2 mitral regurgitation, nonsustained VT seen on event monitor with normal perfusion with low EF by nuclear stress in March 2020. She is on Entresto to maximum dose along with carvedilol to maximum dose.   Patient presents for 1 month follow-up of hypertension.  At last visit patient was complaining of chest pain consistent with musculoskeletal etiology which seem to be causing her significant discomfort and likely contributing to elevated blood pressure.  Patient's chest pain has resolved and she has had no recurrence since last visit.  However her blood pressure remains elevated.  Patient has been going through a job transition as well as financial hardship which is causing significant amounts of stress.  She has been unable to obtain a home blood pressure monitor.  She notes she has made changes to her diet, however unbeknownst to her she has continued to eat high sodium intake.  Patient has noted increased bilateral lower leg edema over the last few days.  Denies chest pain, palpitations, dyspnea, syncope, near syncope.  Denies orthopnea, PND.  Previous precordial pain likely due to underlying musculoskeletal etiology.  Past Medical History:  Diagnosis Date  . Chronic systolic (congestive) heart failure (HCC) 10/14/2018   Echocardiogram 03/19/2019: Low normal LV systolic function with visual EF 50-55%.  . Depression   . Herpes simplex disease   . History of colonoscopy 7/6/32021  . History of syncope    s/p  heart valve repair  1995  .  Hypertension   . NSVT (nonsustained ventricular tachycardia) (HCC)   . Palpitations   . Right ACL tear   . Right knee meniscal tear    Past Surgical History:  Procedure Laterality Date  . ABLATION     SVT at age 38 years  . KNEE ARTHROSCOPY Right 01/21/2014   Procedure: RIGHT  KNEE ARTHROSCOPY WITH DEBRIDEMENT PARTIAL MENISECTOMY AND AUTOGRAPH ACL RECONSTRUCTION;  Surgeon: Eugenia Mcalpine, MD;  Location: [facility];  Service: Orthopedics;  Laterality: Right;  . KNEE ARTHROSCOPY N/A 01/2014  . KNEE REPAIR EXTENSOR MECHANISM  2015  . RETINAL DETACHMENT REPAIR W/ SCLERAL BUCKLE LE Left 08-27-2006  . SVT ABLATION  1995 (approx.  age 16's)   Family History  Problem Relation Age of Onset  . Hypertension Mother   . Diabetes Father   . Hypertension Brother   . Congestive Heart Failure Sister     Social History   Tobacco Use  . Smoking status: Former Smoker    Types: Cigarettes    Quit date: 2010    Years since quitting: 12.2  . Smokeless tobacco: Never Used  . Tobacco comment: social smoker  Substance Use Topics  . Alcohol use: Yes    Comment: Glass or two of wine on occasion, past history of heavy use    Marital Status: Single   ROS  Review of Systems  Constitutional: Negative for weight gain.  Cardiovascular: Positive for leg swelling. Negative for chest pain, claudication, dyspnea on exertion, near-syncope, orthopnea, palpitations, paroxysmal nocturnal dyspnea and syncope.  Hematologic/Lymphatic: Does not bruise/bleed easily.  Musculoskeletal: Negative for joint swelling.  Gastrointestinal: Negative for melena.  Neurological: Negative for dizziness and weakness.   Objective  Blood pressure (!) 157/92, pulse 75, temperature 97.9 F (36.6 C), height 5\' 5"  (1.651 m), weight 243 lb (110.2 kg), SpO2 100 %.  Vitals with BMI 10/27/2020 09/29/2020 09/28/2020  Height 5\' 5"  5\' 5"  -  Weight 243 lbs 240 lbs -  BMI 40.44 39.94 -  Systolic 157 152 09/30/2020  Diastolic 92 88  115  Pulse 75 88 96     Physical Exam Vitals reviewed.  Constitutional:      Appearance: She is well-developed. She is obese.     Comments: Morbidly obese   HENT:     Head: Normocephalic and atraumatic.  Cardiovascular:     Rate and Rhythm: Normal rate and regular rhythm.     Pulses: Intact distal pulses.     Heart sounds: S1 normal and S2 normal. No murmur heard. No gallop.      Comments: No leg edema. No JVD.   Pulmonary:     Effort: Pulmonary effort is normal. No accessory muscle usage or respiratory distress.     Breath sounds: Normal breath sounds. No wheezing, rhonchi or rales.  Abdominal:     General: Bowel sounds are normal.     Palpations: Abdomen is soft.  Musculoskeletal:     Right lower leg: Edema (minimal pitting) present.     Left lower leg: Edema (minimal pitting) present.  Skin:    General: Skin is warm and dry.     Capillary Refill: Capillary refill takes less than 2 seconds.  Neurological:     General: No focal deficit present.     Mental Status: She is alert and oriented to person, place, and time.    Laboratory examination:   Recent Labs    08/23/20 1247  NA 136  K 3.4*  CL 100  CO2 26  GLUCOSE 108*  BUN 15  CREATININE 1.18*  CALCIUM 8.8*  GFRNONAA 56*   CrCl cannot be calculated (Patient's most recent lab result is older than the maximum 21 days allowed.).  CMP Latest Ref Rng & Units 08/23/2020 02/02/2019 12/12/2018  Glucose 70 - 99 mg/dL 10/21/2020) 98 02/04/2019)  BUN 6 - 20 mg/dL 15 20 6   Creatinine 0.44 - 1.00 mg/dL 02/11/2019) 073(X 106(Y  Sodium 135 - 145 mmol/L 136 138 137  Potassium 3.5 - 5.1 mmol/L 3.4(L) 3.7 3.6  Chloride 98 - 111 mmol/L 100 105 99  CO2 22 - 32 mmol/L 26 22 25   Calcium 8.9 - 10.3 mg/dL ) 6.94(W) 9.2  Total Protein 6.5 - 8.1 g/dL - - 7.3  Total Bilirubin 0.3 - 1.2 mg/dL - - 0.5  Alkaline Phos 38 - 126 U/L - - 62  AST 15 - 41 U/L - - 14(L)  ALT 0 - 44 U/L - - 12   CBC Latest Ref Rng & Units 08/23/2020 02/02/2019 12/12/2018   WBC 4.0 - 10.5 K/uL 6.0 6.2 7.2  Hemoglobin 12.0 - 15.0 g/dL 3.5(K 11.7(L) 12.0  Hematocrit 36.0 - 46.0 % 41.0 37.6 38.9  Platelets 150 - 400 K/uL 439(H) 384 450(H)   Lipid Panel  No results found for: CHOL, TRIG, HDL, CHOLHDL, VLDL, LDLCALC, LDLDIRECT HEMOGLOBIN A1C No results found for: HGBA1C, MPG TSH No results for input(s): TSH in the last 8760 hours.  External labs:  08/23/2019: HDL 59, LDL 119, total cholesterol 195, triglycerides 93  05/06/2019: Glucose 88,  BUN 16, serum creatinine 0.90 TSH 1.44  10/09/2016: Chol 140, Trig 51, HDL 57, LDL 73.  Medications and allergies  No Known Allergies   Current Outpatient Medications  Medication Instructions  . albuterol (VENTOLIN HFA) 108 (90 Base) MCG/ACT inhaler 1 puff, Inhalation, Every 6 hours PRN  . carvedilol (COREG) 37.5 mg, Oral, 2 times daily  . chlorthalidone (HYGROTON) 25 MG tablet TAKE 1 TABLET BY MOUTH EVERY DAY IN THE MORNING  . furosemide (LASIX) 20 mg, Oral, Daily  . hydrALAZINE (APRESOLINE) 25 mg, Oral, 3 times daily  . ibuprofen (ADVIL) 800 MG tablet 1 tablet with food or milk  . ketoconazole (NIZORAL) 2 % cream Topical, 2 times daily PRN  . nitroGLYCERIN (NITROSTAT) 0.4 mg, Sublingual, Every 5 min PRN  . omeprazole (PRILOSEC) 20 mg, Oral, Daily before breakfast  . sacubitril-valsartan (ENTRESTO) 97-103 MG 1 tablet, Oral, 2 times daily  . spironolactone (ALDACTONE) 50 mg, Oral, Daily   Radiology:   No results found.  Cardiac Studies:   Event Monitor for 30 days: Critical event report 09/05/2018 at 1115: NSR with 12 beats of VT that was auto-triggered. No reported symptoms- Nuclear stress ordered  Lexiscan Sestamibi stress test 10/19/2018: 1. Lexiscan stress test was performed. Exercise capacity was not assessed. Resting blood pressure was 170/100 mmHg and peak effect blood pressure was 160/90 mmHg. Stress EKG is non diagnostic for ischemia as it is a pharmacologic stress. In addition, the stress  electrocardiogram showed sinus tachycardia, normal stress conduction, no stress arrhythmias and nonspecific ST-T changes.   2. The overall quality of the study is good. There is no evidence of abnormal lung activity. Stress and rest SPECT images demonstrate homogeneous tracer distribution throughout the myocardium. LV cavity is dilated on both rest and stress images. Gated SPECT imaging reveals global decrease in myocardial thickening and wall motion. The left ventricular ejection fraction was reduced at 29%.   3. High risk study due to reduced LVEF. No evidence of ischemia/ infarction seen.  Echocardiogram 12/23/2019:  Left ventricle cavity is normal in size. Moderate concentric hypertrophy  of the left ventricle. Normal global wall motion. Normal LV systolic  function with EF 50-55%. Doppler evidence of grade I (impaired) diastolic  dysfunction, normal LAP.  No significant valvular abnormality.  Normal right atrial pressure.  No significant change compared to previous study on 03/19/2019.   EKG   EKG 09/29/2020: Sinus rhythm at a rate of 81 bpm.  Left atrial enlargement.  Normal axis.  Poor progression, cannot exclude anteroseptal infarct old.  Nonspecific T wave abnormality.  No evidence of underlying ischemia or injury pattern.  EKG 09/28/2020: Sinus rhythm at a rate of 92 bpm, left atrial enlargement.  Normal axis.  Incomplete right bundle branch block.  Poor refreshing, cannot exclude anteroseptal infarct old.  EKG 08/21/2020: Sinus rhythm at a rate of 72 bpm, left atrial enlargement.  Normal axis.  Poor progression, cannot exclude anteroseptal infarct old. No evidence of ischemia or injury.  No significant change compared to EKG 12/16/2019.  EKG normal sinus rhythm at the rate of 84 bpm, LAE, normal axis, poor R wave progression, cannot exclude anteroseptal infarct old.  No evidence of ischemia, otherwise normal EKG. No significant change from 06/18/2019   Assessment     ICD-10-CM   1.  Essential hypertension  I10 Basic metabolic panel  2. Bilateral leg edema  R60.0   3. Chronic systolic (congestive) heart failure (HCC)  I50.22   4. Musculoskeletal chest pain  R07.89  Meds ordered this encounter  Medications  . furosemide (LASIX) 20 MG tablet    Sig: Take 1 tablet (20 mg total) by mouth daily.    Dispense:  30 tablet    Refill:  3  . hydrALAZINE (APRESOLINE) 25 MG tablet    Sig: Take 1 tablet (25 mg total) by mouth 3 (three) times daily.    Dispense:  90 tablet    Refill:  3    There are no discontinued medications.  Recommendations:   Ms. Sorber is a 50 y.o.  AAF patient with hypertension, history of SVT status post ablation at age 39-24 years in [place], [place], new diagnosis of cardiomyopathy diagnosed in Jan 2020 with EF 25-30%, grade 2 diastolic dysfunction, grade 2 mitral regurgitation, nonsustained VT seen on event monitor with normal perfusion with low EF by nuclear stress in March 2020. LVEF improved to 50-55% via echocardiogram 12/2019. She is on Entresto to maximum dose along with carvedilol to maximum dose.   Patient presents for 1 month follow up of hypertension.  She has had no recurrence of precordial pain, again likely musculoskeletal etiology.  Blood pressure remains uncontrolled today's office visit.  Therefore we will add hydralazine 25 mg 3 times daily.  Patient also has worsening lower leg edema over the last several days, will therefore add furosemide 20 mg once daily for 5 days.  We will repeat BMP.  Patient will also obtain previously ordered labs including TSH, lipid profile testing, BNP, and CBC.  Discussed at length with patient regarding diet and lifestyle modifications and recommendations.  Patient several questions morning meal planning and diet options, all questions were answered to her satisfaction.  She has previously been referred for sleep evaluation as well as to a nutritionist for additional guidance and resources regarding diet and  weight loss.  Unfortunately patient has been unable to follow-up with either of these referrals due to financial constraints at this time, however she states she will soon be settled into her new job and able to follow-up.  Follow up in 8 weeks, sooner if needed, for hypertension and leg swelling.    Rayford Halsted, PA-C 10/27/2020, 12:49 PM Office: [phone]

## 2020-10-27 ENCOUNTER — Ambulatory Visit: Payer: 59 | Admitting: Student

## 2020-10-27 ENCOUNTER — Encounter: Payer: Self-pay | Admitting: Student

## 2020-10-27 ENCOUNTER — Other Ambulatory Visit: Payer: Self-pay

## 2020-10-27 VITALS — BP 157/92 | HR 75 | Temp 97.9°F | Ht 65.0 in | Wt 243.0 lb

## 2020-10-27 DIAGNOSIS — I10 Essential (primary) hypertension: Secondary | ICD-10-CM

## 2020-10-27 DIAGNOSIS — R0789 Other chest pain: Secondary | ICD-10-CM

## 2020-10-27 DIAGNOSIS — I5022 Chronic systolic (congestive) heart failure: Secondary | ICD-10-CM

## 2020-10-27 DIAGNOSIS — R600 Localized edema: Secondary | ICD-10-CM

## 2020-10-27 MED ORDER — HYDRALAZINE HCL 25 MG PO TABS: 25.0000 mg | ORAL_TABLET | Freq: Three times a day (TID) | ORAL | 3 refills | Status: DC

## 2020-10-27 MED ORDER — FUROSEMIDE 20 MG PO TABS: 20.0000 mg | ORAL_TABLET | Freq: Every day | ORAL | 3 refills | Status: DC

## 2020-12-26 NOTE — Progress Notes (Deleted)
Primary Physician/Referring:  Laurann Montana, MD  Patient ID: Sydney Jackson, female    DOB: 29-Jul-1971, 50 y.o.   MRN: [id number]  No chief complaint on file.  HPI:    Sydney Jackson  is a 50 y.o. AAF patient with hypertension, history of SVT status post ablation at age 38-25 years in [place], [place], new diagnosis of cardiomyopathy diagnosed in Jan 2020 with EF 25-30%, grade 2 diastolic dysfunction, grade 2 mitral regurgitation, nonsustained VT seen on event monitor with normal perfusion with low EF by nuclear stress in March 2020. She is on Entresto to maximum dose along with carvedilol to maximum dose.   ***Patient presents for 8-week follow-up of hypertension and leg swelling.  At last visit added hydralazine 25 mg 3 times daily and furosemide 20 mg once daily for 5 days.  ***Repeat BMP? Home BP?   Patient presents for 1 month follow-up of hypertension.  At last visit patient was complaining of chest pain consistent with musculoskeletal etiology which seem to be causing her significant discomfort and likely contributing to elevated blood pressure.  Patient's chest pain has resolved and she has had no recurrence since last visit.  However her blood pressure remains elevated.  Patient has been going through a job transition as well as financial hardship which is causing significant amounts of stress.  She has been unable to obtain a home blood pressure monitor.  She notes she has made changes to her diet, however unbeknownst to her she has continued to eat high sodium intake.  Patient has noted increased bilateral lower leg edema over the last few days.  Denies chest pain, palpitations, dyspnea, syncope, near syncope.  Denies orthopnea, PND.  Previous precordial pain likely due to underlying musculoskeletal etiology.  Past Medical History:  Diagnosis Date  . Chronic systolic (congestive) heart failure (HCC) 10/14/2018   Echocardiogram 03/19/2019: Low normal LV systolic function with visual EF  50-55%.  . Depression   . Herpes simplex disease   . History of colonoscopy 7/6/32021  . History of syncope    s/p  heart valve repair  1995  . Hypertension   . NSVT (nonsustained ventricular tachycardia) (HCC)   . Palpitations   . Right ACL tear   . Right knee meniscal tear    Past Surgical History:  Procedure Laterality Date  . ABLATION     SVT at age 68 years  . KNEE ARTHROSCOPY Right 01/21/2014   Procedure: RIGHT  KNEE ARTHROSCOPY WITH DEBRIDEMENT PARTIAL MENISECTOMY AND AUTOGRAPH ACL RECONSTRUCTION;  Surgeon: Eugenia Mcalpine, MD;  Location: [facility];  Service: Orthopedics;  Laterality: Right;  . KNEE ARTHROSCOPY N/A 01/2014  . KNEE REPAIR EXTENSOR MECHANISM  2015  . RETINAL DETACHMENT REPAIR W/ SCLERAL BUCKLE LE Left 08-27-2006  . SVT ABLATION  1995 (approx.  age 50's)   Family History  Problem Relation Age of Onset  . Hypertension Mother   . Diabetes Father   . Hypertension Brother   . Congestive Heart Failure Sister     Social History   Tobacco Use  . Smoking status: Former Smoker    Types: Cigarettes    Quit date: 2010    Years since quitting: 12.3  . Smokeless tobacco: Never Used  . Tobacco comment: social smoker  Substance Use Topics  . Alcohol use: Yes    Comment: Glass or two of wine on occasion, past history of heavy use    Marital Status: Single   ROS  Review of Systems  Constitutional: Negative for weight gain.  Cardiovascular: Positive for leg swelling. Negative for chest pain, claudication, dyspnea on exertion, near-syncope, orthopnea, palpitations, paroxysmal nocturnal dyspnea and syncope.  Hematologic/Lymphatic: Does not bruise/bleed easily.  Musculoskeletal: Negative for joint swelling.  Gastrointestinal: Negative for melena.  Neurological: Negative for dizziness and weakness.   Objective  There were no vitals taken for this visit.  Vitals with BMI 10/27/2020 09/29/2020 09/28/2020  Height 5\' 5"  5\' 5"  -  Weight 243 lbs 240 lbs  -  BMI 40.44 39.94 -  Systolic 157 152  Diastolic 92 88 115  Pulse 75 88 96     Physical Exam Vitals reviewed.  Constitutional:      Appearance: She is well-developed. She is obese.     Comments: Morbidly obese   HENT:     Head: Normocephalic and atraumatic.  Cardiovascular:     Rate and Rhythm: Normal rate and regular rhythm.     Pulses: Intact distal pulses.     Heart sounds: S1 normal and S2 normal. No murmur heard. No gallop.      Comments: No leg edema. No JVD.   Pulmonary:     Effort: Pulmonary effort is normal. No accessory muscle usage or respiratory distress.     Breath sounds: Normal breath sounds. No wheezing, rhonchi or rales.  Abdominal:     General: Bowel sounds are normal.     Palpations: Abdomen is soft.  Musculoskeletal:     Right lower leg: Edema (minimal pitting) present.     Left lower leg: Edema (minimal pitting) present.  Skin:    General: Skin is warm and dry.     Capillary Refill: Capillary refill takes less than 2 seconds.  Neurological:     General: No focal deficit present.     Mental Status: She is alert and oriented to person, place, and time.    Laboratory examination:   Recent Labs    08/23/20 1247  NA 136  K 3.4*  CL 100  CO2 26  GLUCOSE 108*  BUN 15  CREATININE 1.18*  CALCIUM 8.8*  GFRNONAA 56*   CrCl cannot be calculated (Patient's most recent lab result is older than the maximum 21 days allowed.).  CMP Latest Ref Rng & Units 08/23/2020 02/02/2019 12/12/2018  Glucose 70 - 99 mg/dL 02/04/2019) 98 02/11/2019)  BUN 6 - 20 mg/dL 15 20 6   Creatinine 0.44 - 1.00 mg/dL 517(O) 160(V  Sodium 135 - 145 mmol/L 136 138 137  Potassium 3.5 - 5.1 mmol/L 3.4(L) 3.7 3.6  Chloride 98 - 111 mmol/L 100 105 99  CO2 22 - 32 mmol/L 26 22 25   Calcium 8.9 - 10.3 mg/dL 3.71(G) 6.26) 9.2  Total Protein 6.5 - 8.1 g/dL - - 7.3  Total Bilirubin 0.3 - 1.2 mg/dL - - 0.5  Alkaline Phos 38 - 126 U/L - - 62  AST 15 - 41 U/L - - 14(L)  ALT 0 - 44 U/L - - 12    CBC Latest Ref Rng & Units 08/23/2020 02/02/2019 12/12/2018  WBC 4.0 - 10.5 K/uL 6.0 6.2 7.2  Hemoglobin 12.0 - 15.0 g/dL 2.7(O 11.7(L) 12.0  Hematocrit 36.0 - 46.0 % 41.0 37.6 38.9  Platelets 150 - 400 K/uL 439(H) 384 450(H)   Lipid Panel  No results found for: CHOL, TRIG, HDL, CHOLHDL, VLDL, LDLCALC, LDLDIRECT HEMOGLOBIN A1C No results found for: HGBA1C, MPG TSH No results for input(s): TSH in the last 8760 hours.  External labs:  08/23/2019: HDL  59, LDL 119, total cholesterol 195, triglycerides 93  05/06/2019: Glucose 88, BUN 16, serum creatinine 0.90 TSH 1.44  10/09/2016: Chol 140, Trig 51, HDL 57, LDL 73.  Medications and allergies  No Known Allergies   Current Outpatient Medications  Medication Instructions  . albuterol (VENTOLIN HFA) 108 (90 Base) MCG/ACT inhaler 1 puff, Inhalation, Every 6 hours PRN  . carvedilol (COREG) 37.5 mg, Oral, 2 times daily  . chlorthalidone (HYGROTON) 25 MG tablet TAKE 1 TABLET BY MOUTH EVERY DAY IN THE MORNING  . furosemide (LASIX) 20 mg, Oral, Daily  . hydrALAZINE (APRESOLINE) 25 mg, Oral, 3 times daily  . ibuprofen (ADVIL) 800 MG tablet 1 tablet with food or milk  . ketoconazole (NIZORAL) 2 % cream Topical, 2 times daily PRN  . nitroGLYCERIN (NITROSTAT) 0.4 mg, Sublingual, Every 5 min PRN  . omeprazole (PRILOSEC) 20 mg, Oral, Daily before breakfast  . sacubitril-valsartan (ENTRESTO) 97-103 MG 1 tablet, Oral, 2 times daily  . spironolactone (ALDACTONE) 50 mg, Oral, Daily   Radiology:   No results found.  Cardiac Studies:   Event Monitor for 30 days: Critical event report 09/05/2018 at 1115: NSR with 12 beats of VT that was auto-triggered. No reported symptoms- Nuclear stress ordered  Lexiscan Sestamibi stress test 10/19/2018: 1. Lexiscan stress test was performed. Exercise capacity was not assessed. Resting blood pressure was 170/100 mmHg and peak effect blood pressure was 160/90 mmHg. Stress EKG is non diagnostic for ischemia as it  is a pharmacologic stress. In addition, the stress electrocardiogram showed sinus tachycardia, normal stress conduction, no stress arrhythmias and nonspecific ST-T changes.   2. The overall quality of the study is good. There is no evidence of abnormal lung activity. Stress and rest SPECT images demonstrate homogeneous tracer distribution throughout the myocardium. LV cavity is dilated on both rest and stress images. Gated SPECT imaging reveals global decrease in myocardial thickening and wall motion. The left ventricular ejection fraction was reduced at 29%.   3. High risk study due to reduced LVEF. No evidence of ischemia/ infarction seen.  Echocardiogram 12/23/2019:  Left ventricle cavity is normal in size. Moderate concentric hypertrophy  of the left ventricle. Normal global wall motion. Normal LV systolic  function with EF 50-55%. Doppler evidence of grade I (impaired) diastolic  dysfunction, normal LAP.  No significant valvular abnormality.  Normal right atrial pressure.  No significant change compared to previous study on 03/19/2019.   EKG   EKG 09/29/2020: Sinus rhythm at a rate of 81 bpm.  Left atrial enlargement.  Normal axis.  Poor progression, cannot exclude anteroseptal infarct old.  Nonspecific T wave abnormality.  No evidence of underlying ischemia or injury pattern.  EKG 09/28/2020: Sinus rhythm at a rate of 92 bpm, left atrial enlargement.  Normal axis.  Incomplete right bundle branch block.  Poor refreshing, cannot exclude anteroseptal infarct old.  EKG 08/21/2020: Sinus rhythm at a rate of 72 bpm, left atrial enlargement.  Normal axis.  Poor progression, cannot exclude anteroseptal infarct old. No evidence of ischemia or injury.  No significant change compared to EKG 12/16/2019.  EKG normal sinus rhythm at the rate of 84 bpm, LAE, normal axis, poor R wave progression, cannot exclude anteroseptal infarct old.  No evidence of ischemia, otherwise normal EKG. No significant change from  06/18/2019   Assessment   No diagnosis found.  No orders of the defined types were placed in this encounter.   There are no discontinued medications.  Recommendations:   Ms. Hau is  a 50 y.o.  AAF patient with hypertension, history of SVT status post ablation at age 56-25 years in [place], [place], new diagnosis of cardiomyopathy diagnosed in Jan 2020 with EF 25-30%, grade 2 diastolic dysfunction, grade 2 mitral regurgitation, nonsustained VT seen on event monitor with normal perfusion with low EF by nuclear stress in March 2020. LVEF improved to 50-55% via echocardiogram 12/2019. She is on Entresto to maximum dose along with carvedilol to maximum dose.   ***Patient presents for 8-week follow-up of hypertension and leg swelling.  At last visit added hydralazine 25 mg 3 times daily and furosemide 20 mg once daily for 5 days.  ***Repeat BMP? Home BP?   ***  Patient presents for 1 month follow up of hypertension.  She has had no recurrence of precordial pain, again likely musculoskeletal etiology.  Blood pressure remains uncontrolled today's office visit.  Therefore we will add hydralazine 25 mg 3 times daily.  Patient also has worsening lower leg edema over the last several days, will therefore add furosemide 20 mg once daily for 5 days.  We will repeat BMP.  Patient will also obtain previously ordered labs including TSH, lipid profile testing, BNP, and CBC.  Discussed at length with patient regarding diet and lifestyle modifications and recommendations.  Patient several questions morning meal planning and diet options, all questions were answered to her satisfaction.  She has previously been referred for sleep evaluation as well as to a nutritionist for additional guidance and resources regarding diet and weight loss.  Unfortunately patient has been unable to follow-up with either of these referrals due to financial constraints at this time, however she states she will soon be settled into her  new job and able to follow-up.  Follow up in 8 weeks, sooner if needed, for hypertension and leg swelling.    Rayford Halsted, PA-C 12/26/2020, 1:11 PM Office: [phone]

## 2020-12-27 ENCOUNTER — Ambulatory Visit: Payer: 59 | Admitting: Student

## 2020-12-27 DIAGNOSIS — I10 Essential (primary) hypertension: Secondary | ICD-10-CM

## 2020-12-27 DIAGNOSIS — R600 Localized edema: Secondary | ICD-10-CM

## 2021-01-18 ENCOUNTER — Telehealth: Payer: Self-pay

## 2021-01-18 NOTE — Telephone Encounter (Signed)
Pt mortgage company needs a letter verifying she has been seeing Dr. Jacinto Halim and for how long. They need to verify that she has some heart issues and he has been treating her. Pt will come pick u the letter when its ready.

## 2021-01-19 ENCOUNTER — Encounter: Payer: Self-pay | Admitting: Cardiology

## 2021-01-19 ENCOUNTER — Telehealth: Payer: Self-pay | Admitting: Cardiology

## 2021-01-19 NOTE — Telephone Encounter (Signed)
To whom it may concern,  I would like to certify that Ms. Sydney Jackson with date of birth January 25, 1971 has been under my care since 08/25/2018, and continues to follow-up with me.  Due to her underlying significant cardiac issues, she will continue to see me on a regular basis at least on a 66-month to a yearly basis.  Her last visit with me was 10/27/2020.  If you have any further questions regarding this letter, please do not hesitate to contact me.   Yates Decamp, MD, [facility] 01/19/2021, 9:27 PM Office: [phone] Fax: [phone] Pager: [phone]

## 2021-01-19 NOTE — Telephone Encounter (Signed)
I sent the letter to my chart mesaging and she can forward it to anyone. She needs appointment and we had ordered labs and not done yet. I also sent message to front staff to set up appointment with CC and can you please coordinate this

## 2021-01-22 NOTE — Telephone Encounter (Signed)
Attempted to call pt, no answer. Left vm requesting call back.

## 2021-01-23 ENCOUNTER — Other Ambulatory Visit: Payer: Self-pay | Admitting: Student

## 2021-01-23 NOTE — Telephone Encounter (Signed)
With Cardiology Rayford Halsted, PA-C) 02/01/2021 at 3:30 PM

## 2021-01-24 NOTE — Telephone Encounter (Signed)
Second attempt to call pt, no answer. Left vm requesting call back.

## 2021-01-25 NOTE — Telephone Encounter (Signed)
Third attempt to contact patient, no answer. No attempt to contact us has been made. Pt has an appointment 02/01/2021.

## 2021-01-31 ENCOUNTER — Ambulatory Visit (HOSPITAL_COMMUNITY)
Admission: EM | Admit: 2021-01-31 | Discharge: 2021-01-31 | Disposition: A | Discharge status: Home / Self Care | Payer: 59 | Attending: Student | Admitting: Student

## 2021-01-31 ENCOUNTER — Telehealth: Payer: Self-pay

## 2021-01-31 ENCOUNTER — Other Ambulatory Visit: Payer: Self-pay

## 2021-01-31 ENCOUNTER — Encounter (HOSPITAL_COMMUNITY): Payer: Self-pay

## 2021-01-31 DIAGNOSIS — I4729 Other ventricular tachycardia: Secondary | ICD-10-CM

## 2021-01-31 DIAGNOSIS — I429 Cardiomyopathy, unspecified: Secondary | ICD-10-CM | POA: Diagnosis not present

## 2021-01-31 DIAGNOSIS — I509 Heart failure, unspecified: Secondary | ICD-10-CM | POA: Diagnosis not present

## 2021-01-31 DIAGNOSIS — I10 Essential (primary) hypertension: Secondary | ICD-10-CM | POA: Diagnosis not present

## 2021-01-31 DIAGNOSIS — I110 Hypertensive heart disease with heart failure: Secondary | ICD-10-CM

## 2021-01-31 DIAGNOSIS — R0789 Other chest pain: Secondary | ICD-10-CM

## 2021-01-31 DIAGNOSIS — I472 Ventricular tachycardia: Secondary | ICD-10-CM | POA: Diagnosis not present

## 2021-01-31 NOTE — ED Triage Notes (Signed)
Pt reports chills, cough, heaviness/tightness in chest (onset yesterday evening), severe headaches for past 2 days. No headache at this time. First symptoms began Sunday evening.  Has taken theraflu, tylenol for symptoms with some relief.

## 2021-01-31 NOTE — ED Provider Notes (Signed)
[facility]    CSN: [id number] Arrival date & time: 01/31/21  1053      History   Chief Complaint Chief Complaint  Patient presents with   chest heaviness/tightness   Cough   Chills   Headache    HPI Sydney Jackson is a 50 y.o. female presenting with chest pain viral symptoms- cough, chills.  Medical history CHF, depression, hypertension, NSVT.  Followed closely by cardiology, last visit with them was 12 days ago and she has another appointment in 1 day.  Endorses 2 days of viral symptoms including subjective chills, nonproductive cough, throbbing headaches behind forehead.  Symptoms somewhat relieved by TheraFlu and Tylenol.  Patient states that she has had 12 hours of constant substernal chest pain, describes this as a pressure.  States this is constant and not exacerbated by movement, coughing, exertion.  States her breathing feels labored but denies shortness of breath.  Denies dizziness, weakness, worse headache of life, vision changes.   HPI  Past Medical History:  Diagnosis Date   Chronic systolic (congestive) heart failure (HCC) 10/14/2018   Echocardiogram 03/19/2019: Low normal LV systolic function with visual EF 50-55%.   Depression    Herpes simplex disease    History of colonoscopy 7/6/32021   History of syncope    s/p  heart valve repair  1995   Hypertension    NSVT (nonsustained ventricular tachycardia) (HCC)    Palpitations    Right ACL tear    Right knee meniscal tear     Patient Active Problem List   Diagnosis Date Noted   Snoring 09/01/2020   Insomnia disorder, with other sleep disorder, recurrent 09/01/2020   Non-restorative sleep 09/01/2020   Chronic systolic congestive heart failure (HCC) 09/01/2020   Cardiomyopathy (HCC) 09/01/2020   History of Wolff-Parkinson-White (WPW) syndrome 09/01/2020   NSVT (nonsustained ventricular tachycardia) (HCC) 11/05/2018   Influenza B 11/05/2018   S/P ACL reconstruction 01/21/2014   Essential  hypertension 07/14/2012   Generalized anxiety disorder 07/14/2012   Depression 07/14/2012   Retinal detachment 07/14/2012    Past Surgical History:  Procedure Laterality Date   ABLATION     SVT at age 10 years   KNEE ARTHROSCOPY Right 01/21/2014   Procedure: RIGHT  KNEE ARTHROSCOPY WITH DEBRIDEMENT PARTIAL MENISECTOMY AND AUTOGRAPH ACL RECONSTRUCTION;  Surgeon: Eugenia Mcalpine, MD;  Location: [facility] [place];  Service: Orthopedics;  Laterality: Right;   KNEE ARTHROSCOPY N/A 01/2014   KNEE REPAIR EXTENSOR MECHANISM  2015   RETINAL DETACHMENT REPAIR W/ SCLERAL BUCKLE LE Left 08-27-2006   SVT ABLATION  1995 (approx.  age 46's)    OB History   No obstetric history on file.      Home Medications    Prior to Admission medications   Medication Sig Start Date End Date Taking? Authorizing Provider  albuterol (VENTOLIN HFA) 108 (90 Base) MCG/ACT inhaler Inhale 1 puff into the lungs every 6 (six) hours as needed for wheezing or shortness of breath.   Yes [provider]  carvedilol (COREG) 25 MG tablet Take 1.5 tablets (37.5 mg total) by mouth 2 (two) times daily. 08/21/20 08/16/21 Yes Cantwell, Celeste C, PA-C  chlorthalidone (HYGROTON) 25 MG tablet TAKE 1 TABLET BY MOUTH EVERY DAY IN THE MORNING 08/21/20  Yes Cantwell, Celeste C, PA-C  furosemide (LASIX) 20 MG tablet TAKE 1 TABLET BY MOUTH EVERY DAY 01/23/21  Yes Cantwell, Celeste C, PA-C  hydrALAZINE (APRESOLINE) 25 MG tablet TAKE 1 TABLET BY MOUTH THREE TIMES A  DAY 01/23/21  Yes Cantwell, Celeste C, PA-C  ibuprofen (ADVIL) 800 MG tablet 1 tablet with food or milk 06/29/19  Yes [provider]  ketoconazole (NIZORAL) 2 % cream Apply topically 2 (two) times daily as needed. 10/12/20  Yes [provider]  nitroGLYCERIN (NITROSTAT) 0.4 MG SL tablet Place 1 tablet (0.4 mg total) under the tongue every 5 (five) minutes as needed for chest pain (CP or SOB). 02/16/20  Yes Yates Decamp, MD  omeprazole (PRILOSEC) 20 MG  capsule TAKE 1 CAPSULE (20 MG TOTAL) BY MOUTH DAILY BEFORE BREAKFAST. 05/15/20  Yes Yates Decamp, MD  sacubitril-valsartan (ENTRESTO) 97-103 MG Take 1 tablet by mouth 2 (two) times daily. 08/21/20  Yes Cantwell, Celeste C, PA-C  spironolactone (ALDACTONE) 50 MG tablet Take 1 tablet (50 mg total) by mouth daily. 08/21/20   Cantwell, Renne Musca, PA-C    Family History Family History  Problem Relation Age of Onset   Hypertension Mother    Diabetes Father    Hypertension Brother    Congestive Heart Failure Sister     Social History Social History   Tobacco Use   Smoking status: Former    Pack years: 0.00    Types: Cigarettes    Quit date: 2010    Years since quitting: 12.4   Smokeless tobacco: Never   Tobacco comments:    social smoker  Advertising account planner   Vaping Use: Never used  Substance Use Topics   Alcohol use: Yes    Comment: Glass or two of wine on occasion, past history of heavy use    Drug use: No    Comment: hx   THC use, ended 10+ years ago-- (01-17-2014   DENIES     Allergies   Patient has no known allergies.   Review of Systems Review of Systems  Constitutional:  Positive for chills. Negative for appetite change and fever.  HENT:  Positive for congestion. Negative for ear pain, rhinorrhea, sinus pressure, sinus pain and sore throat.   Eyes:  Negative for redness and visual disturbance.  Respiratory:  Positive for cough, chest tightness and shortness of breath. Negative for wheezing.   Cardiovascular:  Negative for chest pain and palpitations.  Gastrointestinal:  Negative for abdominal pain, constipation, diarrhea, nausea and vomiting.  Genitourinary:  Negative for dysuria, frequency and urgency.  Musculoskeletal:  Negative for myalgias.  Neurological:  Negative for dizziness, weakness and headaches.  Psychiatric/Behavioral:  Negative for confusion.   All other systems reviewed and are negative.   Physical Exam Triage Vital Signs ED Triage Vitals  Enc Vitals Group      BP      Pulse      Resp      Temp      Temp src      SpO2      Weight      Height      Head Circumference      Peak Flow      Pain Score      Pain Loc      Pain Edu?      Excl. in GC?    No data found.  Updated Vital Signs BP (!) 144/108   Pulse (!) 102   Temp 99.5 F (37.5 C)   LMP 01/17/2021 (Approximate)   SpO2 97%   Visual Acuity Right Eye Distance:   Left Eye Distance:   Bilateral Distance:    Right Eye Near:   Left Eye Near:    Bilateral  Near:     Physical Exam Vitals reviewed.  Constitutional:      General: She is not in acute distress.    Appearance: Normal appearance. She is not ill-appearing or diaphoretic.  HENT:     Head: Normocephalic and atraumatic.     Right Ear: Hearing, tympanic membrane, ear canal and external ear normal. No swelling or tenderness. There is no impacted cerumen. No mastoid tenderness. Tympanic membrane is not perforated, erythematous, retracted or bulging.     Left Ear: Hearing, tympanic membrane, ear canal and external ear normal. No swelling or tenderness. There is no impacted cerumen. No mastoid tenderness. Tympanic membrane is not perforated, erythematous, retracted or bulging.     Nose:     Right Sinus: No maxillary sinus tenderness or frontal sinus tenderness.     Left Sinus: No maxillary sinus tenderness or frontal sinus tenderness.     Mouth/Throat:     Mouth: Mucous membranes are moist.     Pharynx: Uvula midline. No oropharyngeal exudate or posterior oropharyngeal erythema.     Tonsils: No tonsillar exudate.  Eyes:     Extraocular Movements: Extraocular movements intact.     Pupils: Pupils are equal, round, and reactive to light.  Cardiovascular:     Rate and Rhythm: Normal rate and regular rhythm.     Pulses:          Radial pulses are 2+ on the right side and 2+ on the left side.     Heart sounds: Normal heart sounds.     Comments: Radial pulses equal and symmetric  Pulmonary:     Effort: Pulmonary effort  is normal.     Breath sounds: Normal air entry. Wheezing present. No decreased breath sounds, rhonchi or rales.     Comments: Expiratory wheezes throughout Chest:     Chest wall: No tenderness.     Comments: Pain is not reproducible Abdominal:     General: Abdomen is flat. Bowel sounds are normal.     Palpations: Abdomen is soft.     Tenderness: There is no abdominal tenderness. There is no guarding or rebound.  Musculoskeletal:     Right lower leg: No edema.     Left lower leg: No edema.  Lymphadenopathy:     Cervical: No cervical adenopathy.  Skin:    General: Skin is warm.     Capillary Refill: Capillary refill takes less than 2 seconds.  Neurological:     General: No focal deficit present.     Mental Status: She is alert and oriented to person, place, and time.  Psychiatric:        Attention and Perception: Attention and perception normal.        Mood and Affect: Mood and affect normal.        Behavior: Behavior normal. Behavior is cooperative.        Thought Content: Thought content normal.        Judgment: Judgment normal.     UC Treatments / Results  Labs (all labs ordered are listed, but only abnormal results are displayed) Labs Reviewed - No data to display  EKG   Radiology No results found.  Procedures Procedures (including critical care time)  Medications Ordered in UC Medications - No data to display  Initial Impression / Assessment and Plan / UC Course  I have reviewed the triage vital signs and the nursing notes.  Pertinent labs & imaging results that were available during my care of the patient were  reviewed by me and considered in my medical decision making (see chart for details).     This patient is a very pleasant 50 y.o. year old female presenting with viral URI with cough and chest pain. Pt with history hypertension, CHF, WPW, NSVT, cardiomyopathy. Last followed with cardiology 6/10. Chest pain is not reproducible. EKG today unchanged from  09/2020 EKG.   I am recommending this patient had straight to [facility] emergency department for further evaluation and management of chest tightness. Cannot rule out acute cardiac event in the urgent care setting. She is hemodynamically stable for transport in personal vehicle at this time.     Final Clinical Impressions(s) / UC Diagnoses   Final diagnoses:  Atypical chest pain  Essential hypertension  Chronic congestive heart failure, unspecified heart failure type (HCC)  NSVT (nonsustained ventricular tachycardia) (HCC)  Cardiomyopathy, unspecified type [facility])     Discharge Instructions      -Please head straight to the ED for further evaluation and management of your chest pain. I cannot rule out a heart attack or other cardiac event in the urgent care setting. With your extensive and complicated cardiac history, it's important for you to have cardiac monitoring and bloodwork to rule this out. You most likely have a virus in addition to this chest pain. If your symptoms get worse while on the way, like dizziness, chest pain, weakness- stop and call 911.     ED Prescriptions   None    PDMP not reviewed this encounter.   Rhys Martini, PA-C 01/31/21 1223

## 2021-01-31 NOTE — ED Notes (Signed)
Patient is being discharged from the Urgent Care and sent to the Emergency Department via POV. Per Ignacia Bayley PA, patient is in need of higher level of care due to chest pain, need for further workup. Patient is aware and verbalizes understanding of plan of care. Report called to ED charge RN. Vitals:   01/31/21 1155  BP: (!) 144/108  Pulse: (!) 102  Temp: 99.5 F (37.5 C)  SpO2: 97%

## 2021-01-31 NOTE — Discharge Instructions (Addendum)
-  Please head straight to the ED for further evaluation and management of your chest pain. I cannot rule out a heart attack or other cardiac event in the urgent care setting. With your extensive and complicated cardiac history, it's important for you to have cardiac monitoring and bloodwork to rule this out. You most likely have a virus in addition to this chest pain. If your symptoms get worse while on the way, like dizziness, chest pain, weakness- stop and call 911.

## 2021-02-01 ENCOUNTER — Encounter: Payer: Self-pay | Admitting: Student

## 2021-02-01 ENCOUNTER — Telehealth: Payer: 59 | Admitting: Student

## 2021-02-01 VITALS — Ht 65.0 in | Wt 243.0 lb

## 2021-02-01 DIAGNOSIS — I10 Essential (primary) hypertension: Secondary | ICD-10-CM

## 2021-02-01 DIAGNOSIS — I5022 Chronic systolic (congestive) heart failure: Secondary | ICD-10-CM

## 2021-02-01 DIAGNOSIS — U071 COVID-19: Secondary | ICD-10-CM

## 2021-02-01 LAB — BASIC METABOLIC PANEL
BUN/Creatinine Ratio: 8 — ABNORMAL LOW (ref 9–23)
BUN: 7 mg/dL (ref 6–24)
CO2: 22 mmol/L (ref 20–29)
Calcium: 9.6 mg/dL (ref 8.7–10.2)
Chloride: 99 mmol/L (ref 96–106)
Creatinine, Ser: 0.83 mg/dL (ref 0.57–1.00)
Glucose: 108 mg/dL — ABNORMAL HIGH (ref 65–99)
Potassium: 4.6 mmol/L (ref 3.5–5.2)
Sodium: 136 mmol/L (ref 134–144)
eGFR: 85 mL/min/{1.73_m2} (ref >59)

## 2021-02-01 NOTE — Telephone Encounter (Signed)
Error

## 2021-02-01 NOTE — Progress Notes (Signed)
Primary Physician/Referring:  Laurann Montana, MD  Patient ID: Sydney Jackson, female    DOB: 1971-03-28, 50 y.o.   MRN: [id number]  I connected with the patient on 02/01/2021 by a telephone call and verified that I am speaking with the correct person using two identifiers.     I offered the patient a video enabled application for a virtual visit. Unfortunately, this could not be accomplished due to technical difficulties/lack of video enabled phone/computer. I discussed the limitations of evaluation and management by telemedicine and the availability of in person appointments. The patient expressed understanding and agreed to proceed.   This visit type was conducted due to national recommendations for restrictions regarding the COVID-19 Pandemic (e.g. social distancing).  This format is felt to be most appropriate for this patient at this time.  All issues noted in this document were discussed and addressed.  No physical exam was performed (except for noted visual exam findings with Tele health visits).  The patient has consented to conduct a Tele health visit and understands insurance will be billed.   Chief Complaint  Patient presents with   Hypertension   Mitral Regurgitation   Cardiomyopathy   Follow-up    8 weeks   HPI:    Sydney Jackson  is a 50 y.o. AAF patient with hypertension, history of SVT status post ablation at age 19-25 years in [place], [place], new diagnosis of cardiomyopathy diagnosed in Jan 2020 with EF 25-30%, grade 2 diastolic dysfunction, grade 2 mitral regurgitation, nonsustained VT seen on event monitor with normal perfusion with low EF by nuclear stress in March 2020. She is on Entresto to maximum dose along with carvedilol to maximum dose.   Patient presents for urgent visit at her request, however patient tested positive for Covid 19 infection this morning, therefore visit is being done virtually. Patient's primary concern today is that she was evaluated in urgent  care yesterday and told her EKG was "abnormal" which concerned the patient. Patient went to urgent care yesterday because she had been feeling fatigue, cough, headache, chills, and dyspnea for the last 2-3 days. Patient wanted to have Covid test done, however it was not performed at urgent care and she had to go to the pharmacy this morning.   Patient denies palpitations, orthopnea, PND, syncope, near-syncope.   Past Medical History:  Diagnosis Date   Chronic systolic (congestive) heart failure (HCC) 10/14/2018   Echocardiogram 03/19/2019: Low normal LV systolic function with visual EF 50-55%.   Depression    Herpes simplex disease    History of colonoscopy 7/6/32021   History of syncope    s/p  heart valve repair  1995   Hypertension    NSVT (nonsustained ventricular tachycardia) (HCC)    Palpitations    Right ACL tear    Right knee meniscal tear    Past Surgical History:  Procedure Laterality Date   ABLATION     SVT at age 50 years   KNEE ARTHROSCOPY Right 01/21/2014   Procedure: RIGHT  KNEE ARTHROSCOPY WITH DEBRIDEMENT PARTIAL MENISECTOMY AND AUTOGRAPH ACL RECONSTRUCTION;  Surgeon: Eugenia Mcalpine, MD;  Location: [facility] [place];  Service: Orthopedics;  Laterality: Right;   KNEE ARTHROSCOPY N/A 01/2014   KNEE REPAIR EXTENSOR MECHANISM  2015   RETINAL DETACHMENT REPAIR W/ SCLERAL BUCKLE LE Left 08-27-2006   SVT ABLATION  1995 (approx.  age 50's)   Family History  Problem Relation Age of Onset   Hypertension Mother    Diabetes Father  Hypertension Brother    Congestive Heart Failure Sister     Social History   Tobacco Use   Smoking status: Former    Pack years: 0.00    Types: Cigarettes    Quit date: 2010    Years since quitting: 12.4   Smokeless tobacco: Never   Tobacco comments:    social smoker  Substance Use Topics   Alcohol use: Yes    Comment: Glass or two of wine on occasion, past history of heavy use    Marital Status: Single   ROS  Review of  Systems  Constitutional: Positive for malaise/fatigue.  Cardiovascular:  Negative for chest pain, claudication, dyspnea on exertion, leg swelling, near-syncope, orthopnea, palpitations, paroxysmal nocturnal dyspnea and syncope.  Respiratory:  Positive for cough and shortness of breath.   Musculoskeletal:  Negative for joint swelling.  Gastrointestinal:  Negative for melena.  Neurological:  Positive for headaches.  Objective  Height 5\' 5"  (1.651 m), weight 243 lb (110.2 kg), last menstrual period 01/17/2021.  Vitals with BMI 02/01/2021 01/31/2021 10/27/2020  Height 5\' 5"  - 5\' 5"   Weight 243 lbs - 243 lbs  BMI 40.44 - 40.44  Systolic - 144 157  Diastolic - 108 92  Pulse - 102 75    Due to telehealth nature of today's visit unable to perform phsyical exam and vitals were obtained by patient with her own monitors. Physical exam from last office visit is below for reference:    Physical Exam Vitals reviewed.  Constitutional:      Appearance: She is well-developed. She is obese.     Comments: Morbidly obese   HENT:     Head: Normocephalic and atraumatic.  Cardiovascular:     Rate and Rhythm: Normal rate and regular rhythm.     Pulses: Intact distal pulses.     Heart sounds: S1 normal and S2 normal. No murmur heard.   No gallop.     Comments: No leg edema. No JVD.   Pulmonary:     Effort: Pulmonary effort is normal. No accessory muscle usage or respiratory distress.     Breath sounds: Normal breath sounds. No wheezing, rhonchi or rales.  Abdominal:     General: Bowel sounds are normal.     Palpations: Abdomen is soft.  Musculoskeletal:     Right lower leg: Edema (minimal pitting) present.     Left lower leg: Edema (minimal pitting) present.  Skin:    General: Skin is warm and dry.     Capillary Refill: Capillary refill takes less than 2 seconds.  Neurological:     General: No focal deficit present.     Mental Status: She is alert and oriented to person, place, and time.    Laboratory examination:   Recent Labs    08/23/20 1247 01/31/21 1252  NA 136 136  K 3.4* 4.6  CL 100 99  CO2 26 22  GLUCOSE 108* 108*  BUN 15 7  CREATININE 1.18* 0.83  CALCIUM 8.8* 9.6  GFRNONAA 56*  --    estimated creatinine clearance is 99.1 mL/min (by C-G formula based on SCr of 0.83 mg/dL).  CMP Latest Ref Rng & Units 01/31/2021 08/23/2020 02/02/2019  Glucose 65 - 99 mg/dL 02/02/2021) 10/21/2020) 98  BUN 6 - 24 mg/dL 7 15 20   Creatinine 0.57 - 1.00 mg/dL 02/04/2019 536(R) 443(X  Sodium 134 - 144 mmol/L 136 136 138  Potassium 3.5 - 5.2 mmol/L 4.6 3.4(L) 3.7  Chloride 96 - 106 mmol/L 99 100  105  CO2 20 - 29 mmol/L 22 26 22   Calcium 8.7 - 10.2 mg/dL 9.6 0.1(U) 9.3(A)  Total Protein 6.5 - 8.1 g/dL - - -  Total Bilirubin 0.3 - 1.2 mg/dL - - -  Alkaline Phos 38 - 126 U/L - - -  AST 15 - 41 U/L - - -  ALT 0 - 44 U/L - - -   CBC Latest Ref Rng & Units 08/23/2020 02/02/2019 12/12/2018  WBC 4.0 - 10.5 K/uL 6.0 6.2 7.2  Hemoglobin 12.0 - 15.0 g/dL 35.5 11.7(L) 12.0  Hematocrit 36.0 - 46.0 % 41.0 37.6 38.9  Platelets 150 - 400 K/uL 439(H) 384 450(H)   Lipid Panel  No results found for: CHOL, TRIG, HDL, CHOLHDL, VLDL, LDLCALC, LDLDIRECT HEMOGLOBIN A1C No results found for: HGBA1C, MPG TSH No results for input(s): TSH in the last 8760 hours.  External labs:  08/23/2019: HDL 59, LDL 119, total cholesterol 195, triglycerides 93  05/06/2019: Glucose 88, BUN 16, serum creatinine 0.90 TSH 1.44  10/09/2016: Chol 140, Trig 51, HDL 57, LDL 73.  Medications and allergies  No Known Allergies   Current Outpatient Medications  Medication Instructions   albuterol (VENTOLIN HFA) 108 (90 Base) MCG/ACT inhaler 1 puff, Inhalation, Every 6 hours PRN   carvedilol (COREG) 37.5 mg, Oral, 2 times daily   chlorthalidone (HYGROTON) 25 MG tablet TAKE 1 TABLET BY MOUTH EVERY DAY IN THE MORNING   furosemide (LASIX) 20 MG tablet TAKE 1 TABLET BY MOUTH EVERY DAY   hydrALAZINE (APRESOLINE) 25 MG tablet TAKE 1  TABLET BY MOUTH THREE TIMES A DAY   ibuprofen (ADVIL) 800 MG tablet 1 tablet with food or milk   ketoconazole (NIZORAL) 2 % cream Topical, 2 times daily PRN   nitroGLYCERIN (NITROSTAT) 0.4 mg, Sublingual, Every 5 min PRN   omeprazole (PRILOSEC) 20 mg, Oral, Daily before breakfast   sacubitril-valsartan (ENTRESTO) 97-103 MG 1 tablet, Oral, 2 times daily   spironolactone (ALDACTONE) 50 mg, Oral, Daily   Radiology:   No results found.  Cardiac Studies:   Event Monitor for 30 days: Critical event report 09/05/2018 at 1115: NSR with 12 beats of VT that was auto-triggered. No reported symptoms- Nuclear stress ordered  Lexiscan Sestamibi stress test 10/19/2018: 1. Lexiscan stress test was performed. Exercise capacity was not assessed. Resting blood pressure was 170/100 mmHg and peak effect blood pressure was 160/90 mmHg. Stress EKG is non diagnostic for ischemia as it is a pharmacologic stress. In addition, the stress electrocardiogram showed sinus tachycardia, normal stress conduction, no stress arrhythmias and nonspecific ST-T changes.   2. The overall quality of the study is good. There is no evidence of abnormal lung activity. Stress and rest SPECT images demonstrate homogeneous tracer distribution throughout the myocardium. LV cavity is dilated on both rest and stress images. Gated SPECT imaging reveals global decrease in myocardial thickening and wall motion. The left ventricular ejection fraction was reduced at 29%.   3. High risk study due to reduced LVEF. No evidence of ischemia/ infarction seen.  Echocardiogram 12/23/2019:  Left ventricle cavity is normal in size. Moderate concentric hypertrophy  of the left ventricle. Normal global wall motion. Normal LV systolic  function with EF 50-55%. Doppler evidence of grade I (impaired) diastolic  dysfunction, normal LAP.  No significant valvular abnormality.  Normal right atrial pressure.  No significant change compared to previous study on  03/19/2019.   EKG  External EKG 01/31/2021: sinus tachycardia at rate of 108 bpm.  Left atrial  enlargement.  Normal axis.  Poor progression, cannot exclude anteroseptal infarct old.  Nonspecific T wave abnormality.  Compared to EKG 09/29/2020, no significant change.   EKG 09/29/2020: Sinus rhythm at a rate of 81 bpm.  Left atrial enlargement.  Normal axis.  Poor progression, cannot exclude anteroseptal infarct old.  Nonspecific T wave abnormality.  No evidence of underlying ischemia or injury pattern.  EKG 09/28/2020: Sinus rhythm at a rate of 92 bpm, left atrial enlargement.  Normal axis.  Incomplete right bundle branch block.  Poor refreshing, cannot exclude anteroseptal infarct old.  EKG 08/21/2020: Sinus rhythm at a rate of 72 bpm, left atrial enlargement.  Normal axis.  Poor progression, cannot exclude anteroseptal infarct old. No evidence of ischemia or injury.  No significant change compared to EKG 12/16/2019.  EKG normal sinus rhythm at the rate of 84 bpm, LAE, normal axis, poor R wave progression, cannot exclude anteroseptal infarct old.  No evidence of ischemia, otherwise normal EKG. No significant change from 06/18/2019   Assessment     ICD-10-CM   1. Essential hypertension  I10     2. Chronic systolic (congestive) heart failure (HCC)  I50.22     3. COVID-19 virus infection  U07.1       No orders of the defined types were placed in this encounter.   There are no discontinued medications.  Recommendations:   Ms. Mesch is a 50 y.o.  AAF patient with hypertension, history of SVT status post ablation at age 39-25 years in [place], [place], new diagnosis of cardiomyopathy diagnosed in Jan 2020 with EF 25-30%, grade 2 diastolic dysfunction, grade 2 mitral regurgitation, nonsustained VT seen on event monitor with normal perfusion with low EF by nuclear stress in March 2020. LVEF improved to 50-55% via echocardiogram 12/2019. She is on Entresto to maximum dose along with carvedilol to maximum  dose.   Patient presents for urgent visit at her request, however patient tested positive for Covid 19 infection this morning, therefore visit is being done virtually. Patient's primary concern today is that she was evaluated in urgent care yesterday and told her EKG was "abnormal" which concerned the patient. I personally reviewed urgent care EKG, it [place] unchanged compared to previous. Suspect patient's present symptoms are related to underlying Covid 19 infection. Advised conservative management. Counseled her regarding signs and symptoms that would warrant urgent evaluation, she verbalized understanding and agreement.   Patient's blood pressure is elevated but she admits she has not been compliant with antihypertensive medications. Advised her to continue present medications as directed. Will reevaluate BP control at next visit. Notably patient expresses desires to reduce pill burden.   Follow up in 2 weeks, sooner if needed.    Rayford Halsted, PA-C 02/02/2021, 4:29 PM Office: [phone]

## 2021-09-30 ENCOUNTER — Other Ambulatory Visit: Payer: Self-pay | Admitting: Student

## 2021-09-30 DIAGNOSIS — I5022 Chronic systolic (congestive) heart failure: Secondary | ICD-10-CM

## 2021-09-30 DIAGNOSIS — I428 Other cardiomyopathies: Secondary | ICD-10-CM

## 2022-02-18 ENCOUNTER — Other Ambulatory Visit: Payer: Self-pay | Admitting: Student

## 2022-02-18 DIAGNOSIS — I428 Other cardiomyopathies: Secondary | ICD-10-CM

## 2022-02-18 DIAGNOSIS — I5022 Chronic systolic (congestive) heart failure: Secondary | ICD-10-CM

## 2022-03-01 ENCOUNTER — Encounter: Payer: Self-pay | Admitting: Student

## 2022-03-01 ENCOUNTER — Ambulatory Visit: Payer: Self-pay | Admitting: Student

## 2022-03-01 VITALS — BP 152/97 | HR 93 | Temp 98.7°F | Resp 17 | Ht 65.0 in | Wt 254.0 lb

## 2022-03-01 DIAGNOSIS — I428 Other cardiomyopathies: Secondary | ICD-10-CM

## 2022-03-01 DIAGNOSIS — I5022 Chronic systolic (congestive) heart failure: Secondary | ICD-10-CM

## 2022-03-01 DIAGNOSIS — I10 Essential (primary) hypertension: Secondary | ICD-10-CM

## 2022-03-01 MED ORDER — ENTRESTO 97-103 MG PO TABS: 1.0000 | ORAL_TABLET | Freq: Two times a day (BID) | ORAL | 3 refills | Status: DC

## 2022-03-01 NOTE — Progress Notes (Signed)
Primary Physician/Referring:  Laurann Montana, MD  Patient ID: Sydney Jackson, female    DOB: Jan 01, 1971, 50 y.o.   MRN: [id number]  No chief complaint on file.  HPI:    Sydney Jackson  is a 51 y.o. [facility] patient with hypertension, history of SVT status post ablation at age 13-25 years in [place], [place], new diagnosis of cardiomyopathy diagnosed in Jan 2020 with EF 25-30%, grade 2 diastolic dysfunction, grade 2 mitral regurgitation, nonsustained VT seen on event monitor with normal perfusion with low EF by nuclear stress in March 2020. She is on Entresto to maximum dose along with carvedilol to maximum dose.   Patient was last seen via telehealth in 02/01/21 at which time she admitted to medication non-compliance and was advised to follow up in 2 weeks to reevaluate blood pressure. She was unfortunately lost to follow up until now, presents for annual visit.  Patient has been without Entresto, chlorthalidone, and spironolactone for the last 2 weeks.  She is feeling well with the exception of she has noticed bilateral leg swelling over the last few weeks.  Denies orthopnea, PND, dyspnea.  Past Medical History:  Diagnosis Date   Chronic systolic (congestive) heart failure (HCC) 10/14/2018   Echocardiogram 03/19/2019: Low normal LV systolic function with visual EF 50-55%.   Depression    Herpes simplex disease    History of colonoscopy 7/6/32021   History of syncope    s/p  heart valve repair  1995   Hypertension    NSVT (nonsustained ventricular tachycardia) (HCC)    Palpitations    Right ACL tear    Right knee meniscal tear    Past Surgical History:  Procedure Laterality Date   ABLATION     SVT at age 30 years   KNEE ARTHROSCOPY Right 01/21/2014   Procedure: RIGHT  KNEE ARTHROSCOPY WITH DEBRIDEMENT PARTIAL MENISECTOMY AND AUTOGRAPH ACL RECONSTRUCTION;  Surgeon: Eugenia Mcalpine, MD;  Location: [facility];  Service: Orthopedics;  Laterality: Right;   KNEE ARTHROSCOPY N/A  01/2014   KNEE REPAIR EXTENSOR MECHANISM  2015   RETINAL DETACHMENT REPAIR W/ SCLERAL BUCKLE LE Left 08-27-2006   SVT ABLATION  1995 (approx.  age 36's)   Family History  Problem Relation Age of Onset   Hypertension Mother    Diabetes Father    Hypertension Brother    Congestive Heart Failure Sister     Social History   Tobacco Use   Smoking status: Former    Types: Cigarettes    Quit date: 2010    Years since quitting: 13.5   Smokeless tobacco: Never   Tobacco comments:    social smoker  Substance Use Topics   Alcohol use: Yes    Comment: Glass or two of wine on occasion, past history of heavy use    Marital Status: Single   ROS  Review of Systems  Cardiovascular:  Negative for chest pain, claudication, dyspnea on exertion, leg swelling, near-syncope, orthopnea, palpitations, paroxysmal nocturnal dyspnea and syncope.   Objective  Blood pressure (!) 152/97, pulse 93, temperature 98.7 F (37.1 C), temperature source Temporal, resp. rate 17, height 5\' 5"  (1.651 m), weight 254 lb (115.2 kg), SpO2 98 %.     03/01/2022    8:48 AM 03/01/2022    8:47 AM 02/01/2021    2:56 PM  Vitals with BMI  Height  5\' 5"  5\' 5"   Weight  254 lbs 243 lbs  BMI  42.27 40.44  Systolic 152 154  Diastolic 97 94   Pulse 93 98      Physical Exam Vitals reviewed.  Constitutional:      Appearance: She is well-developed. She is obese.     Comments: Morbidly obese   Cardiovascular:     Rate and Rhythm: Normal rate and regular rhythm.     Pulses: Intact distal pulses.     Heart sounds: S1 normal and S2 normal. No murmur heard.    No gallop.     Comments: No leg edema. No JVD.   Pulmonary:     Effort: Pulmonary effort is normal. No accessory muscle usage or respiratory distress.     Breath sounds: Normal breath sounds. No wheezing, rhonchi or rales.  Musculoskeletal:     Right lower leg: Edema (minimal pitting) present.     Left lower leg: Edema (minimal pitting) present.  Neurological:      Mental Status: She is alert.    Laboratory examination:   No results for input(s): "NA", "K", "CL", "CO2", "GLUCOSE", "BUN", "CREATININE", "CALCIUM", "GFRNONAA", "GFRAA" in the last 8760 hours.  CrCl cannot be calculated (Patient's most recent lab result is older than the maximum 21 days allowed.).     Latest Ref Rng & Units 01/31/2021   12:52 PM 08/23/2020   12:47 PM 02/02/2019    7:39 PM  CMP  Glucose 65 - 99 mg/dL 509  326  98   BUN 6 - 24 mg/dL 7  15  20    Creatinine 0.57 - 1.00 mg/dL  7.12  4.58   Sodium 134 - 144 mmol/L 136  136  138   Potassium 3.5 - 5.2 mmol/L 4.6  3.4  3.7   Chloride 96 - 106 mmol/L 99  100  105   CO2 20 - 29 mmol/L 22  26  22    Calcium 8.7 - 10.2 mg/dL 9.6  8.8  8.4       Latest Ref Rng & Units 08/23/2020   12:47 PM 02/02/2019    7:39 PM 12/12/2018    6:31 AM  CBC  WBC 4.0 - 10.5 K/uL 6.0  6.2  7.2   Hemoglobin 12.0 - 15.0 g/dL 02/04/2019  02/11/2019  83.3   Hematocrit 36.0 - 46.0 % 41.0  37.6  38.9   Platelets 150 - 400 K/uL 439  384  450    Lipid Panel  No results found for: "CHOL", "TRIG", "HDL", "CHOLHDL", "VLDL", "LDLCALC", "LDLDIRECT" HEMOGLOBIN A1C No results found for: "HGBA1C", "MPG" TSH No results for input(s): "TSH" in the last 8760 hours.  External labs:  08/23/2019: HDL 59, LDL 119, total cholesterol 195, triglycerides 93  05/06/2019: Glucose 88, BUN 16, serum creatinine 0.90 TSH 1.44  10/09/2016: Chol 140, Trig 51, HDL 57, LDL 73. Allergies  No Known Allergies    Medications Prior to Visit:   Outpatient Medications Prior to Visit  Medication Sig Dispense Refill   albuterol (VENTOLIN HFA) 108 (90 Base) MCG/ACT inhaler Inhale 1 puff into the lungs every 6 (six) hours as needed for wheezing or shortness of breath.     carvedilol (COREG) 25 MG tablet TAKE 1 & 1/2 TABLETS (37.5 MG TOTAL) BY MOUTH 2 (TWO) TIMES DAILY. 270 tablet 3   chlorthalidone (HYGROTON) 25 MG tablet TAKE 1 TABLET BY MOUTH EVERY DAY IN THE MORNING 90 tablet 3    furosemide (LASIX) 20 MG tablet TAKE 1 TABLET BY MOUTH EVERY DAY 90 tablet 1   hydrALAZINE (APRESOLINE) 25 MG tablet TAKE 1 TABLET BY  MOUTH THREE TIMES A DAY 270 tablet 1   ibuprofen (ADVIL) 800 MG tablet 1 tablet with food or milk     ketoconazole (NIZORAL) 2 % cream Apply topically 2 (two) times daily as needed.     nitroGLYCERIN (NITROSTAT) 0.4 MG SL tablet Place 1 tablet (0.4 mg total) under the tongue every 5 (five) minutes as needed for chest pain (CP or SOB). 25 tablet 0   sacubitril-valsartan (ENTRESTO) 97-103 MG Take 1 tablet by mouth 2 (two) times daily. 180 tablet 3   omeprazole (PRILOSEC) 20 MG capsule TAKE 1 CAPSULE (20 MG TOTAL) BY MOUTH DAILY BEFORE BREAKFAST. 90 capsule 1   spironolactone (ALDACTONE) 50 MG tablet Take 1 tablet (50 mg total) by mouth daily. (Patient not taking: Reported on 03/01/2022) 90 tablet 3   No facility-administered medications prior to visit.   Final Medications at End of Visit    Current Meds  Medication Sig   albuterol (VENTOLIN HFA) 108 (90 Base) MCG/ACT inhaler Inhale 1 puff into the lungs every 6 (six) hours as needed for wheezing or shortness of breath.   carvedilol (COREG) 25 MG tablet TAKE 1 & 1/2 TABLETS (37.5 MG TOTAL) BY MOUTH 2 (TWO) TIMES DAILY.   chlorthalidone (HYGROTON) 25 MG tablet TAKE 1 TABLET BY MOUTH EVERY DAY IN THE MORNING   furosemide (LASIX) 20 MG tablet TAKE 1 TABLET BY MOUTH EVERY DAY   hydrALAZINE (APRESOLINE) 25 MG tablet TAKE 1 TABLET BY MOUTH THREE TIMES A DAY   ibuprofen (ADVIL) 800 MG tablet 1 tablet with food or milk   ketoconazole (NIZORAL) 2 % cream Apply topically 2 (two) times daily as needed.   nitroGLYCERIN (NITROSTAT) 0.4 MG SL tablet Place 1 tablet (0.4 mg total) under the tongue every 5 (five) minutes as needed for chest pain (CP or SOB).   [DISCONTINUED] sacubitril-valsartan (ENTRESTO) 97-103 MG Take 1 tablet by mouth 2 (two) times daily.   Radiology:   No results found.  Cardiac Studies:   Event Monitor  for 30 days: Critical event report 09/05/2018 at 1115: NSR with 12 beats of VT that was auto-triggered. No reported symptoms- Nuclear stress ordered  Lexiscan Sestamibi stress test 10/19/2018: 1. Lexiscan stress test was performed. Exercise capacity was not assessed. Resting blood pressure was 170/100 mmHg and peak effect blood pressure was 160/90 mmHg. Stress EKG is non diagnostic for ischemia as it is a pharmacologic stress. In addition, the stress electrocardiogram showed sinus tachycardia, normal stress conduction, no stress arrhythmias and nonspecific ST-T changes.   2. The overall quality of the study is good. There is no evidence of abnormal lung activity. Stress and rest SPECT images demonstrate homogeneous tracer distribution throughout the myocardium. LV cavity is dilated on both rest and stress images. Gated SPECT imaging reveals global decrease in myocardial thickening and wall motion. The left ventricular ejection fraction was reduced at 29%.   3. High risk study due to reduced LVEF. No evidence of ischemia/ infarction seen.  Echocardiogram 12/23/2019:  Left ventricle cavity is normal in size. Moderate concentric hypertrophy  of the left ventricle. Normal global wall motion. Normal LV systolic  function with EF 50-55%. Doppler evidence of grade I (impaired) diastolic  dysfunction, normal LAP.  No significant valvular abnormality.  Normal right atrial pressure.  No significant change compared to previous study on 03/19/2019.   EKG  03/01/2022: Sinus rhythm at a rate of 92 bpm.  Normal axis.  Left atrial enlargement.  Poor R wave progression, cannot exclude anteroseptal infarct  old.  IVCD.  Nonspecific T wave abnormality.  Compared EKG 01/31/2021, no significant change.  EKG 09/29/2020: Sinus rhythm at a rate of 81 bpm.  Left atrial enlargement.  Normal axis.  Poor progression, cannot exclude anteroseptal infarct old.  Nonspecific T wave abnormality.  No evidence of underlying ischemia or  injury pattern.  EKG 09/28/2020: Sinus rhythm at a rate of 92 bpm, left atrial enlargement.  Normal axis.  Incomplete right bundle branch block.  Poor refreshing, cannot exclude anteroseptal infarct old.  EKG 08/21/2020: Sinus rhythm at a rate of 72 bpm, left atrial enlargement.  Normal axis.  Poor progression, cannot exclude anteroseptal infarct old. No evidence of ischemia or injury.  No significant change compared to EKG 12/16/2019.  EKG normal sinus rhythm at the rate of 84 bpm, LAE, normal axis, poor R wave progression, cannot exclude anteroseptal infarct old.  No evidence of ischemia, otherwise normal EKG. No significant change from 06/18/2019   Assessment     ICD-10-CM   1. Essential hypertension  I10 EKG 12-Lead    Basic metabolic panel    2. Non-ischemic cardiomyopathy (HCC)  I42.8 sacubitril-valsartan (ENTRESTO) 97-103 MG    3. Chronic systolic (congestive) heart failure (HCC)  I50.22 sacubitril-valsartan (ENTRESTO) 97-103 MG    Basic metabolic panel      Meds ordered this encounter  Medications   sacubitril-valsartan (ENTRESTO) 97-103 MG    Sig: Take 1 tablet by mouth 2 (two) times daily.    Dispense:  180 tablet    Refill:  3    Medications Discontinued During This Encounter  Medication Reason   sacubitril-valsartan (ENTRESTO) 97-103 MG Reorder   spironolactone (ALDACTONE) 50 MG tablet Patient has not taken in last 30 days    Recommendations:   Sydney Jackson is a 51 y.o.  AAF patient with hypertension, history of SVT status post ablation at age 32-25 years in [place], [place], new diagnosis of cardiomyopathy diagnosed in Jan 2020 with EF 25-30%, grade 2 diastolic dysfunction, grade 2 mitral regurgitation, nonsustained VT seen on event monitor with normal perfusion with low EF by nuclear stress in March 2020. LVEF improved to 50-55% via echocardiogram 12/2019.  Patient was last seen via telehealth in 02/01/21 at which time she admitted to medication non-compliance and was  advised to follow up in 2 weeks to reevaluate blood pressure. She was unfortunately lost to follow up until now, presents for annual visit.  Patient continues to struggle with medication compliance.  She has been without Entresto, spironolactone, or chlorthalidone for least the last 2 weeks.  Blood pressure is uncontrolled and she has bilateral leg edema.  We will restart Entresto previously tolerated dose and repeat BMP in 1 week.  If BMP remained stable will restart spironolactone followed by repeat labs as well.  Follow-up in 6 weeks, sooner if needed, for hypertension and heart failure.   Rayford Halsted, PA-C 03/01/2022, 9:15 AM Office: [phone]

## 2022-04-12 ENCOUNTER — Ambulatory Visit: Payer: Commercial Managed Care - HMO | Admitting: Cardiology

## 2022-09-10 ENCOUNTER — Encounter (HOSPITAL_COMMUNITY): Payer: Self-pay

## 2022-09-10 ENCOUNTER — Ambulatory Visit (HOSPITAL_COMMUNITY)
Admission: EM | Admit: 2022-09-10 | Discharge: 2022-09-10 | Disposition: A | Discharge status: Home / Self Care | Payer: Commercial Managed Care - HMO | Attending: Internal Medicine | Admitting: Internal Medicine

## 2022-09-10 DIAGNOSIS — R059 Cough, unspecified: Secondary | ICD-10-CM | POA: Insufficient documentation

## 2022-09-10 DIAGNOSIS — J069 Acute upper respiratory infection, unspecified: Secondary | ICD-10-CM | POA: Insufficient documentation

## 2022-09-10 DIAGNOSIS — R509 Fever, unspecified: Secondary | ICD-10-CM | POA: Insufficient documentation

## 2022-09-10 DIAGNOSIS — U071 COVID-19: Secondary | ICD-10-CM | POA: Diagnosis not present

## 2022-09-10 DIAGNOSIS — I428 Other cardiomyopathies: Secondary | ICD-10-CM

## 2022-09-10 DIAGNOSIS — I5022 Chronic systolic (congestive) heart failure: Secondary | ICD-10-CM

## 2022-09-10 DIAGNOSIS — R079 Chest pain, unspecified: Secondary | ICD-10-CM | POA: Diagnosis not present

## 2022-09-10 LAB — BASIC METABOLIC PANEL
Anion gap: 9 (ref 5–15)
BUN: 10 mg/dL (ref 6–20)
CO2: 26 mmol/L (ref 22–32)
Calcium: 8.7 mg/dL — ABNORMAL LOW (ref 8.9–10.3)
Chloride: 99 mmol/L (ref 98–111)
Creatinine, Ser: 0.92 mg/dL (ref 0.44–1.00)
GFR, Estimated: >60 mL/min (ref >60)
Glucose, Bld: 91 mg/dL (ref 70–99)
Potassium: 3.5 mmol/L (ref 3.5–5.1)
Sodium: 134 mmol/L — ABNORMAL LOW (ref 135–145)

## 2022-09-10 MED ORDER — KETOROLAC TROMETHAMINE 30 MG/ML IJ SOLN
INTRAMUSCULAR | Status: AC
  Filled 2022-09-10: qty 1

## 2022-09-10 MED ORDER — KETOROLAC TROMETHAMINE 30 MG/ML IJ SOLN
30.0000 mg | Freq: Once | INTRAMUSCULAR | Status: AC
  Administered 2022-09-10: 30 mg via INTRAMUSCULAR

## 2022-09-10 MED ORDER — GUAIFENESIN ER 1200 MG PO TB12: 1200.0000 mg | ORAL_TABLET | Freq: Two times a day (BID) | ORAL | 0 refills | Status: DC

## 2022-09-10 MED ORDER — CHLORTHALIDONE 25 MG PO TABS: ORAL_TABLET | ORAL | 0 refills | Status: DC

## 2022-09-10 MED ORDER — BENZONATATE 100 MG PO CAPS: 100.0000 mg | ORAL_CAPSULE | Freq: Three times a day (TID) | ORAL | 0 refills | Status: DC

## 2022-09-10 MED ORDER — ACETAMINOPHEN 325 MG PO TABS
975.0000 mg | ORAL_TABLET | Freq: Once | ORAL | Status: AC
  Administered 2022-09-10: 975 mg via ORAL

## 2022-09-10 MED ORDER — ACETAMINOPHEN 325 MG PO TABS
ORAL_TABLET | ORAL | Status: AC
  Filled 2022-09-10: qty 3

## 2022-09-10 MED ORDER — PROMETHAZINE-DM 6.25-15 MG/5ML PO SYRP: 5.0000 mL | ORAL_SOLUTION | Freq: Every evening | ORAL | 0 refills | Status: DC | PRN

## 2022-09-10 MED ORDER — ENTRESTO 97-103 MG PO TABS: 1.0000 | ORAL_TABLET | Freq: Two times a day (BID) | ORAL | 0 refills | Status: AC

## 2022-09-10 NOTE — ED Provider Notes (Signed)
[place]    CSN: [id number] Arrival date & time: 09/10/22  1936      History   Chief Complaint Chief Complaint  Patient presents with   Cough   Nasal Congestion   Fever    HPI Sydney Jackson is a 52 y.o. female.   Patient presents urgent care for evaluation of cough, nasal congestion, chills, generalized headache, and generalized fatigue that started abruptly yesterday.  She did not take her temperature at home but believes she may have had a fever due to chills.  Yesterday she was also experiencing central chest discomfort that she describes as "internal" and to the middle of her chest.  She states that the chest pain could be described as sharp and stabbing/constant.  Chest pain resolved without intervention yesterday and she is not currently experiencing any chest pain, shortness of breath, heart palpitations, limb weakness, dizziness, nausea, vomiting, abdominal pain, orthopnea, and leg swelling.  No recent known sick contacts with similar symptoms.  Headache is generalized and currently a 10 on a scale of 0-10.  Denies vision changes and numbness/tingling to extremities.  Cough is dry and nonproductive.  Cough is worse at nighttime.  She is not a smoker and denies drug use.  History of chronic systolic congestive heart failure and hypertension that is managed by [facility] cardiology.  She states she has been out of her antihypertensive medication for 2 days and therefore has not had her medicine in 2 days.  Blood pressure is currently elevated at 170/110.  She is febrile at 100.4.  Last dose of Tylenol was this morning.  Patient was vaccinated against COVID-19 a few years ago but has not gotten any boosters.  She has been using over-the-counter cold and flu medicines without relief of symptoms.   Cough Associated symptoms: fever   Fever Associated symptoms: cough     Past Medical History:  Diagnosis Date   Chronic systolic (congestive) heart failure ([place]) 10/14/2018    Echocardiogram 03/19/2019: Low normal LV systolic function with visual EF 50-55%.   Depression    Herpes simplex disease    History of colonoscopy 7/6/32021   History of syncope    s/p  heart valve repair  1995   Hypertension    NSVT (nonsustained ventricular tachycardia) (HCC)    Palpitations    Right ACL tear    Right knee meniscal tear     Patient Active Problem List   Diagnosis Date Noted   Snoring 09/01/2020   Insomnia disorder, with other sleep disorder, recurrent 09/01/2020   Non-restorative sleep 93/26/7124   Chronic systolic congestive heart failure ([place]) 09/01/2020   Cardiomyopathy ([place]) 09/01/2020   History of Wolff-Parkinson-White (WPW) syndrome 09/01/2020   NSVT (nonsustained ventricular tachycardia) ([place]) 11/05/2018   Influenza B 11/05/2018   S/P ACL reconstruction 01/21/2014   Essential hypertension 07/14/2012   Generalized anxiety disorder 07/14/2012   Depression 07/14/2012   Retinal detachment 07/14/2012    Past Surgical History:  Procedure Laterality Date   ABLATION     SVT at age 69 years   KNEE ARTHROSCOPY Right 01/21/2014   Procedure: RIGHT  KNEE ARTHROSCOPY WITH DEBRIDEMENT PARTIAL MENISECTOMY AND AUTOGRAPH ACL RECONSTRUCTION;  Surgeon: Sydnee Cabal, MD;  Location: [place];  Service: Orthopedics;  Laterality: Right;   KNEE ARTHROSCOPY N/A 01/2014   KNEE REPAIR EXTENSOR MECHANISM  2015   RETINAL DETACHMENT REPAIR W/ SCLERAL BUCKLE LE Left 08-27-2006   SVT ABLATION  1995 (approx.  age 20's)  OB History   No obstetric history on file.      Home Medications    Prior to Admission medications   Medication Sig Start Date End Date Taking? Authorizing Provider  benzonatate (TESSALON) 100 MG capsule Take 1 capsule (100 mg total) by mouth every 8 (eight) hours. 09/10/22  Yes Talbot Grumbling, FNP  Guaifenesin 1200 MG TB12 Take 1 tablet (1,200 mg total) by mouth in the morning and at bedtime. 09/10/22  Yes Talbot Grumbling, FNP  promethazine-dextromethorphan (PROMETHAZINE-DM) 6.25-15 MG/5ML syrup Take 5 mLs by mouth at bedtime as needed for cough. 09/10/22  Yes Talbot Grumbling, FNP  albuterol (VENTOLIN HFA) 108 (90 Base) MCG/ACT inhaler Inhale 1 puff into the lungs every 6 (six) hours as needed for wheezing or shortness of breath.    [provider]  carvedilol (COREG) 25 MG tablet TAKE 1 & 1/2 TABLETS (37.5 MG TOTAL) BY MOUTH 2 (TWO) TIMES DAILY. 10/01/21   Cantwell, Celeste C, PA-C  chlorthalidone (HYGROTON) 25 MG tablet TAKE 1 TABLET BY MOUTH EVERY DAY IN THE MORNING 08/21/20   Cantwell, Celeste C, PA-C  furosemide (LASIX) 20 MG tablet TAKE 1 TABLET BY MOUTH EVERY DAY 01/23/21   Cantwell, Celeste C, PA-C  hydrALAZINE (APRESOLINE) 25 MG tablet TAKE 1 TABLET BY MOUTH THREE TIMES A DAY 01/23/21   Cantwell, Celeste C, PA-C  ibuprofen (ADVIL) 800 MG tablet 1 tablet with food or milk 06/29/19   [provider]  ketoconazole (NIZORAL) 2 % cream Apply topically 2 (two) times daily as needed. 10/12/20   [provider]  nitroGLYCERIN (NITROSTAT) 0.4 MG SL tablet Place 1 tablet (0.4 mg total) under the tongue every 5 (five) minutes as needed for chest pain (CP or SOB). 02/16/20   Adrian Prows, MD  omeprazole (PRILOSEC) 20 MG capsule TAKE 1 CAPSULE (20 MG TOTAL) BY MOUTH DAILY BEFORE BREAKFAST. 05/15/20   Adrian Prows, MD  sacubitril-valsartan (ENTRESTO) 97-103 MG Take 1 tablet by mouth 2 (two) times daily. 03/01/22   Cantwell, Gerline Legacy, PA-C    Family History Family History  Problem Relation Age of Onset   Hypertension Mother    Diabetes Father    Hypertension Brother    Congestive Heart Failure Sister     Social History Social History   Tobacco Use   Smoking status: Former    Types: Cigarettes    Quit date: 2010    Years since quitting: 14.0   Smokeless tobacco: Never   Tobacco comments:    social smoker  Vaping Use   Vaping Use: Never used  Substance Use Topics   Alcohol use: Yes     Comment: Glass or two of wine on occasion, past history of heavy use    Drug use: No    Comment: hx   THC use, ended 10+ years ago-- (01-17-2014   DENIES     Allergies   Patient has no known allergies.   Review of Systems Review of Systems  Constitutional:  Positive for fever.  Respiratory:  Positive for cough.   Per HPI   Physical Exam Triage Vital Signs ED Triage Vitals  Enc Vitals Group     BP 09/10/22 2010 (!) 170/110     Pulse Rate 09/10/22 2010 (!) 103     Resp 09/10/22 2010 18     Temp 09/10/22 2010 (!) 100.4 F (38 C)     Temp Source 09/10/22 2010 Oral     SpO2 09/10/22 2010 95 %  Weight --      Height --      Head Circumference --      Peak Flow --      Pain Score 09/10/22 2012 10     Pain Loc --      Pain Edu? --      Excl. in GC? --    No data found.  Updated Vital Signs BP (!) 170/110 (BP Location: Right Arm)   Pulse (!) 103   Temp (!) 100.4 F (38 C) (Oral)   Resp 18   SpO2 95%   Visual Acuity Right Eye Distance:   Left Eye Distance:   Bilateral Distance:    Right Eye Near:   Left Eye Near:    Bilateral Near:     Physical Exam Vitals and nursing note reviewed.  Constitutional:      Appearance: She is ill-appearing. She is not toxic-appearing.  HENT:     Head: Normocephalic and atraumatic.     Right Ear: Hearing, tympanic membrane, ear canal and external ear normal.     Left Ear: Hearing, tympanic membrane, ear canal and external ear normal.     Nose: Congestion present.     Mouth/Throat:     Lips: Pink.     Mouth: Mucous membranes are moist.     Pharynx: Posterior oropharyngeal erythema present.  Eyes:     General: Lids are normal. Vision grossly intact. Gaze aligned appropriately.        Right eye: No discharge.        Left eye: No discharge.     Extraocular Movements: Extraocular movements intact.     Conjunctiva/sclera: Conjunctivae normal.     Pupils: Pupils are equal, round, and reactive to light.  Cardiovascular:      Rate and Rhythm: Normal rate and regular rhythm.     Heart sounds: Normal heart sounds, S1 normal and S2 normal.  Pulmonary:     Effort: Pulmonary effort is normal. No respiratory distress.     Breath sounds: Normal breath sounds and air entry. No wheezing, rhonchi or rales.  Chest:     Chest wall: No tenderness.  Abdominal:     General: Bowel sounds are normal.     Palpations: Abdomen is soft.     Tenderness: There is no abdominal tenderness. There is no right CVA tenderness, left CVA tenderness or guarding.  Musculoskeletal:     Cervical back: Neck supple.  Lymphadenopathy:     Cervical: Cervical adenopathy present.  Skin:    General: Skin is warm and dry.     Capillary Refill: Capillary refill takes less than 2 seconds.     Findings: No rash.  Neurological:     General: No focal deficit present.     Mental Status: She is alert and oriented to person, place, and time. Mental status is at baseline.     Cranial Nerves: No dysarthria or facial asymmetry.  Psychiatric:        Mood and Affect: Mood normal.        Speech: Speech normal.        Behavior: Behavior normal.        Thought Content: Thought content normal.        Judgment: Judgment normal.      UC Treatments / Results  Labs (all labs ordered are listed, but only abnormal results are displayed) Labs Reviewed  SARS CORONAVIRUS 2 (TAT 6-24 HRS)  BASIC METABOLIC PANEL    EKG  Radiology No results found.  Procedures Procedures (including critical care time)  Medications Ordered in UC Medications  ketorolac (TORADOL) 30 MG/ML injection 30 mg (has no administration in time range)  acetaminophen (TYLENOL) tablet 975 mg (has no administration in time range)    Initial Impression / Assessment and Plan / UC Course  I have reviewed the triage vital signs and the nursing notes.  Pertinent labs & imaging results that were available during my care of the patient were reviewed by me and considered in my medical  decision making (see chart for details).    Viral URI with cough Symptoms and physical exam consistent with a viral upper respiratory tract infection that will likely resolve with rest, fluids, and prescriptions for symptomatic relief. Deferred imaging based on stable cardiopulmonary exam and hemodynamically stable vital signs. COVID-19 testing is pending.  We will call patient if this is positive.  Quarantine guidelines discussed. Currently on day 2 of symptoms and does qualify for antiviral therapy.   BMP drawn in clinic to evaluate kidney function in case COVID-19 test is positive. If COVID is positive and kidney function is normal, she may have Paxlovid antiviral.  Patient given ketorolac 30mg  IM and tylenol 975mg  in clinic today for headache and fever.   Tessalon perles, promethazine DM, and guaifenesin sent to pharmacy for symptomatic relief to be taken as prescribed.  May continue taking over the counter medications as directed for further symptomatic relief.  Drowsiness precautions discussed regarding promethazine DM prescription.  Nonpharmacologic interventions for symptom relief provided and after visit summary below. Advised to push fluids to stay well hydrated while recovering from viral illness. Work note given.  Discussed physical exam and available lab work findings in clinic with patient.  Counseled patient regarding appropriate use of medications and potential side effects for all medications recommended or prescribed today. Discussed red flag signs and symptoms of worsening condition,when to call the PCP office, return to urgent care, and when to seek higher level of care in the emergency department. Patient verbalizes understanding and agreement with plan. All questions answered. Patient discharged in stable condition.    Final Clinical Impressions(s) / UC Diagnoses   Final diagnoses:  Viral URI with cough     Discharge Instructions      You have a viral upper respiratory  infection.  COVID-19 testing is pending. We will call you with results if positive. If your COVID test is positive, you must stay at home until day 6 of symptoms. On day 6, you may go out into public and go back to work, but you must wear a mask until day 11 of symptoms to prevent spread to others.  Use the following medicines to help with symptoms: - Plain Mucinex (guaifenesin) over the counter as directed every 12 hours to thin mucous so that you are able to get it out of your body easier. Drink plenty of water while taking this medication so that it works well in your body (at least 8 cups a day).  - Tylenol 1,000mg  and/or ibuprofen 600mg  every 6 hours with food as needed for aches/pains or fever/chills.  - Tessalon perles every 8 hours as needed for cough. - Take Promethazine DM cough medication to help with your cough at nighttime so that you are able to sleep. Do not drive, drink alcohol, or go to work while taking this medication since it can make you sleepy. Only take this at nighttime.   1 tablespoon of honey in warm water and/or  salt water gargles may also help with symptoms. Humidifier to your room will help add water to the air and reduce coughing.  If you develop any new or worsening symptoms, please return.  If your symptoms are severe, please go to the emergency room.  Follow-up with your primary care provider for further evaluation and management of your symptoms as well as ongoing wellness visits.  I hope you feel better!    ED Prescriptions     Medication Sig Dispense Auth. Provider   benzonatate (TESSALON) 100 MG capsule Take 1 capsule (100 mg total) by mouth every 8 (eight) hours. 21 capsule Talbot Grumbling, FNP   promethazine-dextromethorphan (PROMETHAZINE-DM) 6.25-15 MG/5ML syrup Take 5 mLs by mouth at bedtime as needed for cough. 118 mL Joella Prince M, FNP   Guaifenesin 1200 MG TB12 Take 1 tablet (1,200 mg total) by mouth in the morning and at bedtime. 14  tablet Talbot Grumbling, FNP      PDMP not reviewed this encounter.   Joella Prince [place], [place] 09/11/22 [phone]

## 2022-09-10 NOTE — Discharge Instructions (Addendum)
You have a viral upper respiratory infection.  COVID-19 testing is pending. We will call you with results if positive. If your COVID test is positive, you must stay at home until day 6 of symptoms. On day 6, you may go out into public and go back to work, but you must wear a mask until day 11 of symptoms to prevent spread to others.  I drew blood work today to evaluate your kidney function. I will call you if your blood work is abnormal.   I have refilled your blood pressure medicine. Please schedule an appointment to follow-up with your PCP for ongoing evaluation.   Use the following medicines to help with symptoms: - Plain Mucinex (guaifenesin) over the counter as directed every 12 hours to thin mucous so that you are able to get it out of your body easier. Drink plenty of water while taking this medication so that it works well in your body (at least 8 cups a day).  - Tylenol 1,000mg  and/or ibuprofen 600mg  every 6 hours with food as needed for aches/pains or fever/chills.  - Tessalon perles every 8 hours as needed for cough. - Take Promethazine DM cough medication to help with your cough at nighttime so that you are able to sleep. Do not drive, drink alcohol, or go to work while taking this medication since it can make you sleepy. Only take this at nighttime.   1 tablespoon of honey in warm water and/or salt water gargles may also help with symptoms. Humidifier to your room will help add water to the air and reduce coughing.  If you develop any new or worsening symptoms, please return.  If your symptoms are severe, please go to the emergency room.  Follow-up with your primary care provider for further evaluation and management of your symptoms as well as ongoing wellness visits.  I hope you feel better!

## 2022-09-10 NOTE — ED Triage Notes (Signed)
Patient c/o a headache, chills, nasal congestion, sneezing, chills., and a cough since yesterday.

## 2022-09-11 ENCOUNTER — Telehealth (HOSPITAL_COMMUNITY): Payer: Self-pay | Admitting: Emergency Medicine

## 2022-09-11 LAB — SARS CORONAVIRUS 2 (TAT 6-24 HRS): SARS Coronavirus 2: POSITIVE — AB

## 2022-09-11 MED ORDER — NIRMATRELVIR/RITONAVIR (PAXLOVID)TABLET: 3.0000 | ORAL_TABLET | Freq: Two times a day (BID) | ORAL | 0 refills | Status: AC

## 2022-12-31 ENCOUNTER — Telehealth: Payer: Self-pay

## 2022-12-31 ENCOUNTER — Other Ambulatory Visit: Payer: Self-pay

## 2022-12-31 DIAGNOSIS — I428 Other cardiomyopathies: Secondary | ICD-10-CM

## 2022-12-31 DIAGNOSIS — I5022 Chronic systolic (congestive) heart failure: Secondary | ICD-10-CM

## 2022-12-31 MED ORDER — ENTRESTO 97-103 MG PO TABS: 1.0000 | ORAL_TABLET | Freq: Two times a day (BID) | ORAL | 3 refills | Status: DC

## 2022-12-31 NOTE — Telephone Encounter (Signed)
Patient has scheduled appointment she needed Entresto filled. I sent in a 1 month supply. I also gave her Sherryll Burger co-pay information.

## 2023-01-16 ENCOUNTER — Ambulatory Visit: Payer: Self-pay | Admitting: Cardiology

## 2023-01-16 NOTE — Progress Notes (Deleted)
Primary Physician/Referring:  Laurann Montana, MD  Patient ID: Sydney Jackson, female    DOB: 1971-04-12, 52 y.o.   MRN: [id number]  No chief complaint on file.  HPI:    Sydney Jackson  is a 52 y.o. AAF patient with hypertension, morbid obesity, history of SVT status post ablation at age 76-25 years in [place], [place], new diagnosis of nonischemic cardiomyopathy but negative nuclear stress test diagnosed in Jan 2020 with EF 25-30%,  nonsustained VT, started on guideline directed medical therapy.  Her EF is normalized now.  She now presents for a annual visit.  ***  Past Medical History:  Diagnosis Date   Chronic systolic (congestive) heart failure (HCC) 10/14/2018   Echocardiogram 03/19/2019: Low normal LV systolic function with visual EF 50-55%.   Depression    Herpes simplex disease    History of colonoscopy 7/6/32021   History of syncope    s/p  heart valve repair  1995   Hypertension    NSVT (nonsustained ventricular tachycardia) (HCC)    Palpitations    Right ACL tear    Right knee meniscal tear    Past Surgical History:  Procedure Laterality Date   ABLATION     SVT at age 52 years   KNEE ARTHROSCOPY Right 01/21/2014   Procedure: RIGHT  KNEE ARTHROSCOPY WITH DEBRIDEMENT PARTIAL MENISECTOMY AND AUTOGRAPH ACL RECONSTRUCTION;  Surgeon: Eugenia Mcalpine, MD;  Location: [facility] [place];  Service: Orthopedics;  Laterality: Right;   KNEE ARTHROSCOPY N/A 01/2014   KNEE REPAIR EXTENSOR MECHANISM  2015   RETINAL DETACHMENT REPAIR W/ SCLERAL BUCKLE LE Left 08-27-2006   SVT ABLATION  1995 (approx.  age 64's)   Family History  Problem Relation Age of Onset   Hypertension Mother    Diabetes Father    Hypertension Brother    Congestive Heart Failure Sister     Social History   Tobacco Use   Smoking status: Former    Types: Cigarettes    Quit date: 2010    Years since quitting: 14.4   Smokeless tobacco: Never   Tobacco comments:    social smoker  Substance Use  Topics   Alcohol use: Yes    Comment: Glass or two of wine on occasion, past history of heavy use    Marital Status: Single  ROS  ***ROS Objective      09/10/2022    8:10 PM 03/01/2022    8:48 AM 03/01/2022    8:47 AM  Vitals with BMI  Height   5\' 5"   Weight   254 lbs  BMI   42.27  Systolic 170 152 409  Diastolic 110 97 94  Pulse 103 93 98   There were no vitals taken for this visit.   ***Physical Exam  Laboratory examination:   Lab Results  Component Value Date   NA 134 (L) 09/10/2022   K 3.5 09/10/2022   CO2 26 09/10/2022   GLUCOSE 91 09/10/2022   BUN 10 09/10/2022   CREATININE 0.92 09/10/2022   CALCIUM 8.7 (L) 09/10/2022   EGFR 85 01/31/2021   GFRNONAA >60 09/10/2022      Latest Ref Rng & Units 08/23/2020   12:47 PM 02/02/2019    7:39 PM 12/12/2018    6:31 AM  CBC  WBC 4.0 - 10.5 K/uL 6.0  6.2  7.2   Hemoglobin 12.0 - 15.0 g/dL 81.1  91.4  78.2   Hematocrit 36.0 - 46.0 % 41.0  37.6  38.9  Platelets 150 - 400 K/uL 439  384  450    Lab Results  Component Value Date   TSH 0.861 10/15/2018    External labs:   Cholesterol, total 200.000 m 08/24/2020 HDL 56.000 mg 08/24/2020 LDL 122.000 m 08/24/2020 Triglycerides 125.000 m 08/24/2020  Radiology:    Cardiac Studies:   Event Monitor for 30 days: Critical event report 09/05/2018 at 1115: NSR with 12 beats of VT that was auto-triggered. No reported symptoms- Nuclear stress ordered  Lexiscan Sestamibi stress test 10/19/2018: 1. Lexiscan stress test was performed. Exercise capacity was not assessed. Resting blood pressure was 170/100 mmHg and peak effect blood pressure was 160/90 mmHg. Stress EKG is non diagnostic for ischemia as it is a pharmacologic stress. In addition, the stress electrocardiogram showed sinus tachycardia, normal stress conduction, no stress arrhythmias and nonspecific ST-T changes.   2. The overall quality of the study is good. There is no evidence of abnormal lung activity. Stress and rest  SPECT images demonstrate homogeneous tracer distribution throughout the myocardium. LV cavity is dilated on both rest and stress images. Gated SPECT imaging reveals global decrease in myocardial thickening and wall motion. The left ventricular ejection fraction was reduced at 29%.   3. High risk study due to reduced LVEF. No evidence of ischemia/ infarction seen.  Echocardiogram 12/23/2019:  Left ventricle cavity is normal in size. Moderate concentric hypertrophy  of the left ventricle. Normal global wall motion. Normal LV systolic  function with EF 50-55%. Doppler evidence of grade I (impaired) diastolic  dysfunction, normal LAP.  No significant valvular abnormality.  Normal right atrial pressure.  No significant change compared to previous study on 03/19/2019.  EKG:   ***  EKG 03/01/2022: Sinus rhythm at a rate of 92 bpm.  Normal axis.  Left atrial enlargement.  Poor R wave progression, cannot exclude anteroseptal infarct old.  IVCD.  Nonspecific T wave abnormality.  Compared EKG 01/31/2021, no significant change.  Medications and allergies  No Known Allergies   Medication list   Current Outpatient Medications:    albuterol (VENTOLIN HFA) 108 (90 Base) MCG/ACT inhaler, Inhale 1 puff into the lungs every 6 (six) hours as needed for wheezing or shortness of breath., Disp: , Rfl:    benzonatate (TESSALON) 100 MG capsule, Take 1 capsule (100 mg total) by mouth every 8 (eight) hours., Disp: 21 capsule, Rfl: 0   carvedilol (COREG) 25 MG tablet, TAKE 1 & 1/2 TABLETS (37.5 MG TOTAL) BY MOUTH 2 (TWO) TIMES DAILY., Disp: 270 tablet, Rfl: 3   chlorthalidone (HYGROTON) 25 MG tablet, TAKE 1 TABLET BY MOUTH EVERY DAY IN THE MORNING, Disp: 30 tablet, Rfl: 0   furosemide (LASIX) 20 MG tablet, TAKE 1 TABLET BY MOUTH EVERY DAY, Disp: 90 tablet, Rfl: 1   Guaifenesin 1200 MG TB12, Take 1 tablet (1,200 mg total) by mouth in the morning and at bedtime., Disp: 14 tablet, Rfl: 0   hydrALAZINE (APRESOLINE) 25 MG  tablet, TAKE 1 TABLET BY MOUTH THREE TIMES A DAY, Disp: 270 tablet, Rfl: 1   ibuprofen (ADVIL) 800 MG tablet, 1 tablet with food or milk, Disp: , Rfl:    ketoconazole (NIZORAL) 2 % cream, Apply topically 2 (two) times daily as needed., Disp: , Rfl:    nitroGLYCERIN (NITROSTAT) 0.4 MG SL tablet, Place 1 tablet (0.4 mg total) under the tongue every 5 (five) minutes as needed for chest pain (CP or SOB)., Disp: 25 tablet, Rfl: 0   omeprazole (PRILOSEC) 20 MG capsule, TAKE 1  CAPSULE (20 MG TOTAL) BY MOUTH DAILY BEFORE BREAKFAST., Disp: 90 capsule, Rfl: 1   promethazine-dextromethorphan (PROMETHAZINE-DM) 6.25-15 MG/5ML syrup, Take 5 mLs by mouth at bedtime as needed for cough., Disp: 118 mL, Rfl: 0   sacubitril-valsartan (ENTRESTO) 97-103 MG, Take 1 tablet by mouth 2 (two) times daily., Disp: 180 tablet, Rfl: 3  Assessment     ICD-10-CM   1. Non-ischemic cardiomyopathy (HCC)  I42.8     2. Chronic systolic (congestive) heart failure (HCC)  I50.22     3. Essential hypertension  I10        No orders of the defined types were placed in this encounter.   No orders of the defined types were placed in this encounter.   There are no discontinued medications.   Recommendations:   Sydney Jackson is a 52 y.o. AAF patient with morbid obesity, hypertension, history of SVT status post ablation at age 19-24 years in [place], [place], new diagnosis of nonischemic cardiomyopathy but negative nuclear stress test diagnosed in Jan 2020 with EF 25-30%,  nonsustained VT, started on guideline directed medical therapy.  Her EF is normalized now.  She now presents for a annual visit. ***    Yates Decamp, MD, [facility] 01/16/2023, 10:38 AM Office: [phone]

## 2023-09-08 ENCOUNTER — Telehealth: Payer: Self-pay | Admitting: Cardiology

## 2023-09-08 DIAGNOSIS — I428 Other cardiomyopathies: Secondary | ICD-10-CM

## 2023-09-08 DIAGNOSIS — I5022 Chronic systolic (congestive) heart failure: Secondary | ICD-10-CM

## 2023-09-08 MED ORDER — CARVEDILOL 25 MG PO TABS: 37.5000 mg | ORAL_TABLET | Freq: Two times a day (BID) | ORAL | 2 refills | Status: DC

## 2023-09-08 MED ORDER — ENTRESTO 97-103 MG PO TABS: 1.0000 | ORAL_TABLET | Freq: Two times a day (BID) | ORAL | 2 refills | Status: DC

## 2023-09-08 MED ORDER — HYDRALAZINE HCL 25 MG PO TABS
25.0000 mg | ORAL_TABLET | Freq: Three times a day (TID) | ORAL | 2 refills | Status: DC
Start: 2023-09-08 — End: 2024-05-18

## 2023-09-08 MED ORDER — FUROSEMIDE 20 MG PO TABS: 20.0000 mg | ORAL_TABLET | Freq: Every day | ORAL | 2 refills | Status: DC

## 2023-09-08 NOTE — Telephone Encounter (Signed)
*  STAT* If patient is at the pharmacy, call can be transferred to refill team.   1. Which medications need to be refilled? (please list name of each medication and dose if known)  sacubitril-valsartan (ENTRESTO) 97-103 MG   carvedilol (COREG) 25 MG tablet  furosemide (LASIX) 20 MG tablet  hydrALAZINE (APRESOLINE) 25 MG tablet   2. Would you like to learn more about the convenience, safety, & potential cost savings by using the [facility]? N/A     3. Are you open to using the [facility] (Type [facility]. N/A   4. Which pharmacy/location (including street and city if local pharmacy) is medication to be sent to?  [id number] - [place], [place] - [address]     5. Do they need a 30 day or 90 day supply? Needs enough medication to last her until 04/08 appt.   Patient is completely out of medications.

## 2023-11-06 LAB — LAB REPORT - SCANNED: EGFR: 62

## 2023-11-08 NOTE — Progress Notes (Signed)
 PCP labs 11/05/2023:  Hb 11.8/HCT 35.7, platelets 386.  Serum glucose 92 mg, BUN 19, creatinine 1.06, EGFR 62 mL, potassium 4.3.  LFTs normal.  Total cholesterol 186, triglycerides 86, HDL 48, LDL 122.  TSH normal at 1.94.

## 2023-11-14 NOTE — Progress Notes (Signed)
 Consider for the EASi HF study with an aldosterone synthetase inhibitor with empagliflozin versus empagliflozin alone.

## 2023-11-18 ENCOUNTER — Encounter: Payer: Self-pay | Admitting: Cardiology

## 2023-11-18 ENCOUNTER — Ambulatory Visit: Payer: Commercial Managed Care - HMO | Attending: Cardiology | Admitting: Cardiology

## 2023-11-18 VITALS — BP 124/70 | HR 83 | Resp 16 | Ht 65.0 in | Wt 246.8 lb

## 2023-11-18 DIAGNOSIS — I10 Essential (primary) hypertension: Secondary | ICD-10-CM | POA: Diagnosis not present

## 2023-11-18 DIAGNOSIS — E7800 Pure hypercholesterolemia, unspecified: Secondary | ICD-10-CM

## 2023-11-18 DIAGNOSIS — I5032 Chronic diastolic (congestive) heart failure: Secondary | ICD-10-CM | POA: Diagnosis not present

## 2023-11-18 DIAGNOSIS — I428 Other cardiomyopathies: Secondary | ICD-10-CM

## 2023-11-18 MED ORDER — CARVEDILOL 25 MG PO TABS: 25.0000 mg | ORAL_TABLET | Freq: Two times a day (BID) | ORAL | 2 refills | Status: DC

## 2023-11-18 MED ORDER — ATORVASTATIN CALCIUM 10 MG PO TABS: 10.0000 mg | ORAL_TABLET | Freq: Every day | ORAL | 3 refills | Status: DC

## 2023-11-18 MED ORDER — ENTRESTO 97-103 MG PO TABS: 1.0000 | ORAL_TABLET | Freq: Two times a day (BID) | ORAL | 2 refills | Status: DC

## 2023-11-18 NOTE — Progress Notes (Signed)
 Cardiology Office Note:  .   Date:  11/18/2023  ID:  Sydney Jackson, DOB May 20, 1971, MRN [id number] PCP: Laurann Montana, MD  [facility] Providers Cardiologist:  Yates Decamp, MD   History of Present Illness: .   Sydney Jackson is a 53 y.o. [facility] patient with hypertension, history of SVT status post ablation at age 32-25 years in [place], [place], new diagnosis of cardiomyopathy diagnosed in Jan 2020 with EF 25-30%, grade 2 diastolic dysfunction, grade 2 mitral regurgitation with recovery of EF to normal echocardiogram in 2021, presently on guideline directed medical therapy.  She had been lost to follow-up for almost 2 years.  She also has history of medication noncompliance.   Discussed the use of AI scribe software for clinical note transcription with the patient, who gave verbal consent to proceed.  History of Present Illness   Sydney Jackson, a patient with a history of heart disease, presents today for a follow-up visit. She reports that she has been off her prescribed medication for about a year due to insurance issues. During this time, she has been taking old medication that she had at home. She reports feeling "pretty good" but is unsure about the exact medications she is currently taking. She takes three tablets in the morning and none at night. She also mentions that she has been making changes to her lifestyle, including dietary changes, in an attempt to lose weight. She has been feeling well overall, with no new or worsening symptoms.      Labs   External Labs:  PCP labs 11/05/2023:   Hb 11.8/HCT 35.7, platelets 386.   Serum glucose 92 mg, BUN 19, creatinine 1.06, EGFR 62 mL, potassium 4.3.  LFTs normal.   Total cholesterol 186, triglycerides 86, HDL 48, LDL 122.   TSH normal at 1.94.  Review of Systems  Cardiovascular:  Negative for chest pain, dyspnea on exertion and leg swelling.   Physical Exam:   VS:  BP 124/70 (BP Location: Left Arm, Patient Position: Sitting, Cuff  Size: Large)   Pulse 83   Resp 16   Ht 5\' 5"  (1.651 m)   Wt 246 lb 12.8 oz (111.9 kg)   SpO2 99%   BMI 41.07 kg/m    Wt Readings from Last 3 Encounters:  11/18/23 246 lb 12.8 oz (111.9 kg)  03/01/22 254 lb (115.2 kg)  02/01/21 243 lb (110.2 kg)    Physical Exam Constitutional:      Appearance: She is morbidly obese.  Neck:     Vascular: No carotid bruit or JVD.  Cardiovascular:     Rate and Rhythm: Normal rate and regular rhythm.     Pulses: Intact distal pulses.     Heart sounds: Normal heart sounds. No murmur heard.    No gallop.  Pulmonary:     Effort: Pulmonary effort is normal.     Breath sounds: Normal breath sounds.  Abdominal:     General: Bowel sounds are normal.     Palpations: Abdomen is soft.  Musculoskeletal:     Right lower leg: No edema.     Left lower leg: No edema.    Studies Reviewed: Marland Kitchen    Lexiscan Sestamibi stress test 10/19/2018: 1. Lexiscan stress test was performed. Exercise capacity was not assessed. Resting blood pressure was 170/100 mmHg and peak effect blood pressure was 160/90 mmHg. Stress EKG is non diagnostic for ischemia as it is a pharmacologic stress. In addition, the stress electrocardiogram showed sinus tachycardia, normal stress  conduction, no stress arrhythmias and nonspecific ST-T changes.   2. The overall quality of the study is good. There is no evidence of abnormal lung activity. Stress and rest SPECT images demonstrate homogeneous tracer distribution throughout the myocardium. LV cavity is dilated on both rest and stress images. Gated SPECT imaging reveals global decrease in myocardial thickening and wall motion. The left ventricular ejection fraction was reduced at 29%.   3. High risk study due to reduced LVEF. No evidence of ischemia/ infarction seen.   Echocardiogram 12/23/2019:  Left ventricle cavity is normal in size. Moderate concentric hypertrophy  of the left ventricle. Normal global wall motion. Normal LV systolic  function  with EF 50-55%. Doppler evidence of grade I (impaired) diastolic  dysfunction, normal LAP.  No significant valvular abnormality.  Normal right atrial pressure.  No significant change compared to previous study on 03/19/2019.  EKG:    EKG Interpretation Date/Time:  Tuesday November 18 2023 08:35:31 EDT Ventricular Rate:  79 PR Interval:  172 QRS Duration:  82 QT Interval:  416 QTC Calculation: 477 R Axis:   22  Text Interpretation: EKG 11/18/2023: Normal sinus rhythm at rate of 79 bpm, normal axis, poor R wave progression, cannot exclude anteroseptal infarct old.  Low-voltage complexes.  Pulmonary disease pattern.  Compared to 09/10/2022, low-voltage complexes are new. Reconfirmed by Delrae Rend [phone]) on 11/18/2023 8:44:16 AM    03/01/2022: Sinus rhythm at a rate of 92 bpm. Normal axis. Left atrial enlargement. Poor R wave progression, cannot exclude anteroseptal infarct old. IVCD. Nonspecific T wave abnormality.   Medications and allergies    No Known Allergies   Current Outpatient Medications:    albuterol (VENTOLIN HFA) 108 (90 Base) MCG/ACT inhaler, Inhale 1 puff into the lungs every 6 (six) hours as needed for wheezing or shortness of breath., Disp: , Rfl:    atorvastatin (LIPITOR) 10 MG tablet, Take 1 tablet (10 mg total) by mouth daily., Disp: 90 tablet, Rfl: 3   chlorthalidone (HYGROTON) 25 MG tablet, TAKE 1 TABLET BY MOUTH EVERY DAY IN THE MORNING, Disp: 30 tablet, Rfl: 0   hydrALAZINE (APRESOLINE) 25 MG tablet, Take 1 tablet (25 mg total) by mouth 3 (three) times daily., Disp: 90 tablet, Rfl: 2   ibuprofen (ADVIL) 800 MG tablet, Take 800 mg by mouth daily as needed for headache., Disp: , Rfl:    ketoconazole (NIZORAL) 2 % cream, Apply topically 2 (two) times daily as needed., Disp: , Rfl:    nitroGLYCERIN (NITROSTAT) 0.4 MG SL tablet, Place 1 tablet (0.4 mg total) under the tongue every 5 (five) minutes as needed for chest pain (CP or SOB)., Disp: 25 tablet, Rfl: 0   carvedilol  (COREG) 25 MG tablet, Take 1 tablet (25 mg total) by mouth 2 (two) times daily. Pt must keep appt with Cardiology in April 2025 for any more refills.1st attempt Thank You, Disp: 90 tablet, Rfl: 2   sacubitril-valsartan (ENTRESTO) 97-103 MG, Take 1 tablet by mouth 2 (two) times daily., Disp: 60 tablet, Rfl: 2   Meds ordered this encounter  Medications   sacubitril-valsartan (ENTRESTO) 97-103 MG    Sig: Take 1 tablet by mouth 2 (two) times daily.    Dispense:  60 tablet    Refill:  2    Pt must keep appt with Cardiology in April 2025 for any more refills.1st attempt Thank You   carvedilol (COREG) 25 MG tablet    Sig: Take 1 tablet (25 mg total) by mouth 2 (two) times  daily. Pt must keep appt with Cardiology in April 2025 for any more refills.1st attempt Thank You    Dispense:  90 tablet    Refill:  2    DX Code Needed  .   atorvastatin (LIPITOR) 10 MG tablet    Sig: Take 1 tablet (10 mg total) by mouth daily.    Dispense:  90 tablet    Refill:  3     Medications Discontinued During This Encounter  Medication Reason   benzonatate (TESSALON) 100 MG capsule Completed Course   omeprazole (PRILOSEC) 20 MG capsule Patient Preference   promethazine-dextromethorphan (PROMETHAZINE-DM) 6.25-15 MG/5ML syrup Completed Course   Guaifenesin 1200 MG TB12 Completed Course   furosemide (LASIX) 20 MG tablet No longer needed (for PRN medications)   sacubitril-valsartan (ENTRESTO) 97-103 MG Reorder   carvedilol (COREG) 25 MG tablet Reorder     ASSESSMENT AND PLAN: .      ICD-10-CM   1. Non-ischemic cardiomyopathy (HCC)  I42.8 EKG 12-Lead    sacubitril-valsartan (ENTRESTO) 97-103 MG    carvedilol (COREG) 25 MG tablet    ECHOCARDIOGRAM COMPLETE    2. Chronic heart failure with preserved ejection fraction (HCC)  I50.32     3. Essential hypertension  I10     4. Hypercholesteremia  E78.00 atorvastatin (LIPITOR) 10 MG tablet      1. Non-ischemic cardiomyopathy (HCC) (Primary) Patient had been  out of medications for almost a year and a half due to some insurance issues.  She has restarted her medications but not appropriately and she is not sure exactly what medications she is taking.  Presently on Entresto max dose at 1 tablet once a day and carvedilol 25 mg once a day.  Advised her to increase both of them to twice daily dosing for now.  I will consider changing her Entresto to valsartan if her LVEF is preserved, will obtain echocardiogram now.  I would like to see her back in 6 weeks for follow-up.  - EKG 12-Lead - sacubitril-valsartan (ENTRESTO) 97-103 MG; Take 1 tablet by mouth 2 (two) times daily.  Dispense: 60 tablet; Refill: 2 - carvedilol (COREG) 25 MG tablet; Take 1 tablet (25 mg total) by mouth 2 (two) times daily. Pt must keep appt with Cardiology in April 2025 for any more refills.1st attempt Thank You  Dispense: 90 tablet; Refill: 2  2. Essential hypertension Fortunately blood pressure has been well-controlled in spite of her medication changes.  She is not sure whether she is taking hydralazine or or chlorthalidone, she will call us and let us know.  3. Chronic heart failure with preserved EF Patient's EF improved on 12/23/2019, I will repeat echocardiogram.  Her EKG shows low voltage complexes around concerned about this as well and this could be related to obesity but could also represent worsening cardiomyopathy.  4. Hypercholesteremia She has been off of atorvastatin, I started her back on 10 mg atorvastatin, external labs reviewed.  This was a 40-minute office visit encounter, extremely difficult to reconcile her medications and she had multiple questions regarding obesity, GLP-1 agonist.   There is significant confusion regarding her medication regimen, including dosages and specific medications. Emphasize the importance of maintaining an accurate medication list for effective management and emergency situations. Instruct her to maintain an accurate list of medications  and dosages. Request an update of the medication list via MyChart or phone call.  I have reassured her, advised her that she can try any GLP agonist which would be certainly helpful in  both weight loss and from cardiac standpoint as well. - atorvastatin (LIPITOR) 10 MG tablet; Take 1 tablet (10 mg total) by mouth daily.  Dispense: 90 tablet; Refill: 3  Signed,  Yates Decamp, MD, [facility] 11/18/2023, 9:12 AM [facility] [address] [place], [place] [address] Phone: [phone]. Fax:  [phone]

## 2023-11-18 NOTE — Patient Instructions (Signed)
 Medication Instructions:  Change carvedilol to twice daily  Change Entresto to twice daily Start Atorvastatin 10 mg by mouth daily  *If you need a refill on your cardiac medications before your next appointment, please call your pharmacy*  Lab Work: none If you have labs (blood work) drawn today and your tests are completely normal, you will receive your results only by: MyChart Message (if you have MyChart) OR A paper copy in the mail If you have any lab test that is abnormal or we need to change your treatment, we will call you to review the results.  Testing/Procedures: Your physician has requested that you have an echocardiogram. Echocardiography is a painless test that uses sound waves to create images of your heart. It provides your doctor with information about the size and shape of your heart and how well your heart's chambers and valves are working. This procedure takes approximately one hour. There are no restrictions for this procedure. Please do NOT wear cologne, perfume, aftershave, or lotions (deodorant is allowed). Please arrive 15 minutes prior to your appointment time.  Please note: We ask at that you not bring children with you during ultrasound (echo/ vascular) testing. Due to room size and safety concerns, children are not allowed in the ultrasound rooms during exams. Our front office staff cannot provide observation of children in our lobby area while testing is being conducted. An adult accompanying a patient to their appointment will only be allowed in the ultrasound room at the discretion of the ultrasound technician under special circumstances. We apologize for any inconvenience.   Follow-Up: At [facility], you and your health needs are our priority.  As part of our continuing mission to provide you with exceptional heart care, our providers are all part of one team.  This team includes your primary Cardiologist (physician) and Advanced Practice Providers or  APPs (Physician Assistants and Nurse Practitioners) who all work together to provide you with the care you need, when you need it.  Your next appointment:   May 15 at 11:20  Provider:   Yates Decamp, MD     We recommend signing up for the patient portal called "MyChart".  Sign up information is provided on this After Visit Summary.  MyChart is used to connect with patients for Virtual Visits (Telemedicine).  Patients are able to view lab/test results, encounter notes, upcoming appointments, etc.  Non-urgent messages can be sent to your provider as well.   To learn more about what you can do with MyChart, go to [email or web address].   Other Instructions   Please let us know what medications you have been taking      1st Floor: - Lobby - Registration  - Pharmacy  - Lab - Cafe  2nd Floor: - PV Lab - Diagnostic Testing (echo, CT, nuclear med)  3rd Floor: - Vacant  4th Floor: - TCTS (cardiothoracic surgery) - AFib Clinic - Structural Heart Clinic - Vascular Surgery  - Vascular Ultrasound  5th Floor: - HeartCare Cardiology (general and EP) - Clinical Pharmacy for coumadin, hypertension, lipid, weight-loss medications, and med management appointments    Valet parking services will be available as well.

## 2023-11-21 NOTE — Telephone Encounter (Signed)
 Message from patient indicates she is taking Carvedilol 25 mg --1.5 tablets twice daily.  No other medications listed. At last office visit it was noted patient was taking Carvedilol 25 mg once daily and it was increased to twice daily  Will forward to Dr Jacinto Halim to see what dose he recommends patient take

## 2023-11-21 NOTE — Telephone Encounter (Signed)
 Can you please see if I need to reconcile

## 2023-12-18 ENCOUNTER — Ambulatory Visit (HOSPITAL_COMMUNITY): Attending: Cardiology

## 2023-12-18 DIAGNOSIS — I428 Other cardiomyopathies: Secondary | ICD-10-CM | POA: Insufficient documentation

## 2023-12-18 LAB — ECHOCARDIOGRAM COMPLETE
Area-P 1/2: 3.17 cm2
S' Lateral: 2.5 cm

## 2023-12-19 ENCOUNTER — Encounter: Payer: Self-pay | Admitting: Cardiology

## 2023-12-19 NOTE — Progress Notes (Signed)
 Normal echo.

## 2023-12-25 ENCOUNTER — Ambulatory Visit: Admitting: Cardiology

## 2023-12-26 NOTE — Progress Notes (Deleted)
 Please consider for the EASi HF trial with an aldosterone synthetase inhibitor with empagliflozin.  Her most recent ECHO showed a LVEF of 69%.

## 2024-01-09 ENCOUNTER — Ambulatory Visit: Admitting: Cardiology

## 2024-02-23 ENCOUNTER — Ambulatory Visit: Admitting: Cardiology

## 2024-04-08 ENCOUNTER — Ambulatory Visit: Admitting: Cardiology

## 2024-05-18 ENCOUNTER — Other Ambulatory Visit (HOSPITAL_COMMUNITY): Payer: Self-pay

## 2024-05-18 ENCOUNTER — Encounter: Payer: Self-pay | Admitting: Cardiology

## 2024-05-18 ENCOUNTER — Ambulatory Visit: Attending: Cardiology | Admitting: Cardiology

## 2024-05-18 VITALS — BP 114/68 | HR 84 | Ht 64.0 in | Wt 257.0 lb

## 2024-05-18 DIAGNOSIS — E7800 Pure hypercholesterolemia, unspecified: Secondary | ICD-10-CM

## 2024-05-18 DIAGNOSIS — I5020 Unspecified systolic (congestive) heart failure: Secondary | ICD-10-CM | POA: Diagnosis not present

## 2024-05-18 DIAGNOSIS — I428 Other cardiomyopathies: Secondary | ICD-10-CM

## 2024-05-18 DIAGNOSIS — I10 Essential (primary) hypertension: Secondary | ICD-10-CM | POA: Diagnosis not present

## 2024-05-18 MED ORDER — CARVEDILOL 25 MG PO TABS: 25.0000 mg | ORAL_TABLET | ORAL | 3 refills | Status: AC

## 2024-05-18 MED ORDER — ATORVASTATIN CALCIUM 10 MG PO TABS
10.0000 mg | ORAL_TABLET | ORAL | 3 refills | Status: AC
  Filled 2024-05-18: qty 8, 28d supply, fill #0

## 2024-05-18 MED ORDER — SACUBITRIL-VALSARTAN 97-103 MG PO TABS: 1.0000 | ORAL_TABLET | Freq: Every evening | ORAL | 3 refills | Status: AC

## 2024-05-18 NOTE — Patient Instructions (Signed)
 Medication Instructions:  The current medical regimen is effective;  continue present plan and medications.  *If you need a refill on your cardiac medications before your next appointment, please call your pharmacy*  Follow-Up: At [facility], you and your health needs are our priority.  As part of our continuing mission to provide you with exceptional heart care, our providers are all part of one team.  This team includes your primary Cardiologist (physician) and Advanced Practice Providers or APPs (Physician Assistants and Nurse Practitioners) who all work together to provide you with the care you need, when you need it.  Your next appointment:   6 month(s)  Provider:   Knox Perl, MD    We recommend signing up for the patient portal called "MyChart".  Sign up information is provided on this After Visit Summary.  MyChart is used to connect with patients for Virtual Visits (Telemedicine).  Patients are able to view lab/test results, encounter notes, upcoming appointments, etc.  Non-urgent messages can be sent to your provider as well.   To learn more about what you can do with MyChart, go to [email or web address].

## 2024-05-18 NOTE — Progress Notes (Signed)
 Cardiology Office Note:  .   Date:  05/18/2024  ID:  Sydney Jackson, DOB [birth year], MRN [id number] PCP: Teresa Channel, MD  [facility] Providers Cardiologist:  Gordy Bergamo, MD   History of Present Illness: .   Sydney Jackson is a 53 y.o. [facility] patient with hypertension, history of SVT status post ablation at age 85-25 years in [place], [place], new diagnosis of cardiomyopathy diagnosed in Jan 2020 with EF 25-30% with recovery of EF to normal echocardiogram 12/23/2019 presently on guideline directed medical therapy.  She had been lost to follow-up for almost 2 years.  She also has history of medication noncompliance.  She was seen in April 2025 and I am bringing her back in to improve compliance and follow-up.  Fortunately repeat echocardiogram on 12/18/2023 again reveals normal LVEF with mild LVH.  She has been taking her medications differently than prescribed, has been taking Entresto  once a day and also carvedilol  once a day.  She could not tolerate atorvastatin  stated that it made her feel  Cardiac Studies relevent.    Lexiscan  Sestamibi stress test 10/19/2018:  Normal perfusion with markedly reduced LV systolic function at 29%.  LV is dilated both at rest and stress images.  ECHOCARDIOGRAM COMPLETE 12/18/2023  1. Left ventricular ejection fraction, by estimation, is 65 to 70%. Left ventricular ejection fraction by 3D volume is 69 %. The left ventricle has normal function. The left ventricle has no regional wall motion abnormalities. There is mild left ventricular hypertrophy. Left ventricular diastolic parameters were normal. The average left ventricular global longitudinal strain is -18.2 %. The global longitudinal strain is normal. 2. Right ventricular systolic function is normal. The right ventricular size is normal. Tricuspid regurgitation signal is inadequate for assessing PA pressure.    Discussed the use of AI scribe software for clinical note transcription with the patient, who  gave verbal consent to proceed.  History of Present Illness Sydney Jackson is a 53 year old female with heart failure who presents for medication management and concerns about swelling and medication side effects.  Heart failure symptoms and medication adherence - Swelling and weight gain over the past two weeks after running out of Entresto  due to insurance and pharmacy issues - Currently taking carvedilol  once daily and furosemide  for fluid management - No Entresto  for two weeks  Statin intolerance - Atorvastatin  causes sensations of feeling 'woozy' and 'zapped out' - Irregular intake of atorvastatin  due to side effects - Plans to try taking atorvastatin  twice a week to assess for side effects  Dietary habits and weight gain - Poor dietary habits contributing to weight gain - Interest in weight loss options  Psychosocial stressors - Stressful job and long work hours contributing to stress and poor dietary habits  Medication access issues - Challenges with insurance coverage for medications, specifically Entresto   Labs    Care everywhere/Faxed External Labs:  PCP labs 11/05/2023:   Hb 11.8/HCT 35.7, platelets 386.   Serum glucose 92 mg, BUN 19, creatinine 1.06, EGFR 62 mL, potassium 4.3.  LFTs normal.   Total cholesterol 186, triglycerides 86, HDL 48, LDL 122.   TSH normal at 1.94.  ROS  Review of Systems  Cardiovascular:  Negative for chest pain, dyspnea on exertion and leg swelling.   Physical Exam:   VS:  There were no vitals taken for this visit.   Wt Readings from Last 3 Encounters:  11/18/23 246 lb 12.8 oz (111.9 kg)  03/01/22 254 lb (115.2 kg)  02/01/21 243 lb (110.2 kg)    BP Readings from Last 3 Encounters:  11/18/23 124/70  09/10/22 (!) 170/110  03/01/22 (!) 152/97   Physical Exam Constitutional:      Appearance: She is morbidly obese.  Neck:     Vascular: No carotid bruit or JVD.  Cardiovascular:     Rate and Rhythm: Normal rate and regular  rhythm.     Pulses: Intact distal pulses.     Heart sounds: Normal heart sounds. No murmur heard.    No gallop.  Pulmonary:     Effort: Pulmonary effort is normal.     Breath sounds: Normal breath sounds.  Abdominal:     General: Bowel sounds are normal.     Palpations: Abdomen is soft.  Musculoskeletal:     Right lower leg: No edema.     Left lower leg: No edema.    EKG:         ASSESSMENT AND PLAN: .      ICD-10-CM   1. 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 Essential hypertension  I10     3. Hypercholesteremia  E78.00       Assessment and Plan Assessment & Plan Chronic diastolic heart failure- Resolved Systolic heart failure Systolic heart failure is now resolved and LVEF persist to be well-preserved since 2021. She is not in acute decompensated heart failure, symptoms are well-controlled with current medication regimen. - Stop hydralazine . - Continue carvedilol  once daily in the morning. - Continue Entresto  once daily at night. - Suspect episodes of marked fatigue and weakness is related to hypotension from medications when she was taking them together in the morning. - Although suboptimal dose, patient has been on the present medication regimen and EF continues to be preserved.  Compliance is important hence continue present dosage.  Essential hypertension Blood pressure is well-controlled with current medications. Blood pressure was previously soft, likely due to medication adjustments. - Continue carvedilol  once daily in the morning. - Continue Entresto  once daily at night. - Blood pressure is soft and hence would not recommend higher dose.  Compliance by making medications twice daily would be reduced.  Edema of lower extremities Edema likely due to poor dietary habits and excessive fluid intake. Hydralazine  may have contributed to swelling. - Stop hydralazine . - Advise on dietary changes to reduce salt intake  and fluid restriction. - Increase furosemide  to twice daily for one week to manage swelling.  Hypercholesterolemia with atorvastatin  intolerance Intolerance to atorvastatin  noted with symptoms of feeling 'funny' and woozy. LDL cholesterol was previously 122 mg/dL, indicating need for cholesterol management. - Take atorvastatin  twice weekly on Tuesdays and Thursdays. - Monitor for symptoms and discontinue if adverse effects persist then can discontinue atorvastatin , could consider alternative or Zetia to reduce LDL to <100 from primary prevention standpoint..  Obesity Obesity management is complicated by stress and poor dietary habits. She is interested in weight loss medications. - Defer to Dr. Montie Pizza for initiation of generic form of Zepbound for weight loss. - Advise on dietary changes and stress management techniques.  Medication management She experiences difficulty with medication access due to insurance and pharmacy issues. [facility] and local pharmacy options discussed for better management. - Send prescriptions to [facility] for 90-day supply and to local pharmacy for immediate 30-day supply. - Consider [facility] for more patient-oriented service.   Follow up: 6 months or sooner if problems for diastolic heart failure and  hyperlipidemia and hypertension.  Signed,  Gordy Bergamo, MD, [facility] 05/18/2024, 3:54 PM [facility] [address] [place], [place] [address] Phone: [phone]. Fax:  [phone]

## 2024-05-27 ENCOUNTER — Other Ambulatory Visit (HOSPITAL_COMMUNITY): Payer: Self-pay

## 2024-07-30 ENCOUNTER — Other Ambulatory Visit (HOSPITAL_COMMUNITY): Payer: Self-pay

## 2024-08-10 ENCOUNTER — Encounter: Payer: Self-pay | Admitting: Cardiology

## 2024-08-10 MED ORDER — FUROSEMIDE 20 MG PO TABS: 20.0000 mg | ORAL_TABLET | Freq: Two times a day (BID) | ORAL | 0 refills | Status: DC

## 2024-08-10 MED ORDER — FUROSEMIDE 20 MG PO TABS: 20.0000 mg | ORAL_TABLET | Freq: Every day | ORAL | 0 refills | Status: AC
